# Patient Record
Sex: Male | Born: 1940
Health system: Southern US, Community
[De-identification: ages and names within clinical notes are randomized; demographics above are authoritative.]

## PROBLEM LIST (undated history)

## (undated) DIAGNOSIS — E538 Deficiency of other specified B group vitamins: Secondary | ICD-10-CM

## (undated) DIAGNOSIS — I350 Nonrheumatic aortic (valve) stenosis: Secondary | ICD-10-CM

## (undated) DIAGNOSIS — M5030 Other cervical disc degeneration, unspecified cervical region: Secondary | ICD-10-CM

## (undated) DIAGNOSIS — S82892A Other fracture of left lower leg, initial encounter for closed fracture: Secondary | ICD-10-CM

## (undated) DIAGNOSIS — E7800 Pure hypercholesterolemia, unspecified: Secondary | ICD-10-CM

## (undated) DIAGNOSIS — Z952 Presence of prosthetic heart valve: Secondary | ICD-10-CM

## (undated) DIAGNOSIS — N489 Disorder of penis, unspecified: Secondary | ICD-10-CM

## (undated) DIAGNOSIS — K5790 Diverticulosis of intestine, part unspecified, without perforation or abscess without bleeding: Secondary | ICD-10-CM

## (undated) DIAGNOSIS — C4490 Unspecified malignant neoplasm of skin, unspecified: Secondary | ICD-10-CM

## (undated) DIAGNOSIS — R7989 Other specified abnormal findings of blood chemistry: Secondary | ICD-10-CM

## (undated) DIAGNOSIS — Z973 Presence of spectacles and contact lenses: Secondary | ICD-10-CM

## (undated) DIAGNOSIS — E039 Hypothyroidism, unspecified: Secondary | ICD-10-CM

## (undated) DIAGNOSIS — I10 Essential (primary) hypertension: Secondary | ICD-10-CM

## (undated) DIAGNOSIS — E559 Vitamin D deficiency, unspecified: Secondary | ICD-10-CM

## (undated) HISTORY — DX: Disorder of penis, unspecified: N48.9

## (undated) HISTORY — DX: Deficiency of other specified B group vitamins: E53.8

## (undated) HISTORY — DX: Nonrheumatic aortic (valve) stenosis: I35.0

## (undated) HISTORY — DX: Hypothyroidism, unspecified: E03.9

## (undated) HISTORY — DX: Other fracture of left lower leg, initial encounter for closed fracture: S82.892A

## (undated) HISTORY — DX: Diverticulosis of intestine, part unspecified, without perforation or abscess without bleeding: K57.90

## (undated) HISTORY — PX: MENISCUS REPAIR: SHX5179

## (undated) HISTORY — PX: UMBILICAL HERNIA REPAIR: SHX196

## (undated) HISTORY — DX: Other specified abnormal findings of blood chemistry: R79.89

## (undated) HISTORY — PX: COLONOSCOPY: SHX174

## (undated) HISTORY — DX: Essential (primary) hypertension: I10

## (undated) HISTORY — DX: Unspecified malignant neoplasm of skin, unspecified: C44.90

## (undated) HISTORY — DX: Other cervical disc degeneration, unspecified cervical region: M50.30

## (undated) HISTORY — DX: Vitamin D deficiency, unspecified: E55.9

## (undated) HISTORY — DX: Pure hypercholesterolemia, unspecified: E78.00

---

## 2006-04-27 ENCOUNTER — Ambulatory Visit (HOSPITAL_BASED_OUTPATIENT_CLINIC_OR_DEPARTMENT_OTHER): Admission: RE | Admit: 2006-04-27 | Discharge: 2006-04-27 | Payer: Self-pay | Admitting: Orthopedic Surgery

## 2012-08-02 DIAGNOSIS — S82892A Other fracture of left lower leg, initial encounter for closed fracture: Secondary | ICD-10-CM

## 2012-08-02 HISTORY — DX: Other fracture of left lower leg, initial encounter for closed fracture: S82.892A

## 2015-06-06 DIAGNOSIS — E039 Hypothyroidism, unspecified: Secondary | ICD-10-CM | POA: Diagnosis not present

## 2015-06-06 DIAGNOSIS — E569 Vitamin deficiency, unspecified: Secondary | ICD-10-CM | POA: Diagnosis not present

## 2015-06-06 DIAGNOSIS — R799 Abnormal finding of blood chemistry, unspecified: Secondary | ICD-10-CM | POA: Diagnosis not present

## 2015-06-06 DIAGNOSIS — E291 Testicular hypofunction: Secondary | ICD-10-CM | POA: Diagnosis not present

## 2015-06-06 DIAGNOSIS — D510 Vitamin B12 deficiency anemia due to intrinsic factor deficiency: Secondary | ICD-10-CM | POA: Diagnosis not present

## 2015-06-06 DIAGNOSIS — R5382 Chronic fatigue, unspecified: Secondary | ICD-10-CM | POA: Diagnosis not present

## 2015-06-06 DIAGNOSIS — E559 Vitamin D deficiency, unspecified: Secondary | ICD-10-CM | POA: Diagnosis not present

## 2015-10-01 DIAGNOSIS — M549 Dorsalgia, unspecified: Secondary | ICD-10-CM | POA: Diagnosis not present

## 2015-10-01 DIAGNOSIS — I358 Other nonrheumatic aortic valve disorders: Secondary | ICD-10-CM | POA: Diagnosis not present

## 2015-10-01 DIAGNOSIS — R0781 Pleurodynia: Secondary | ICD-10-CM | POA: Diagnosis not present

## 2015-10-07 DIAGNOSIS — E291 Testicular hypofunction: Secondary | ICD-10-CM | POA: Diagnosis not present

## 2015-10-07 DIAGNOSIS — R5382 Chronic fatigue, unspecified: Secondary | ICD-10-CM | POA: Diagnosis not present

## 2015-10-07 DIAGNOSIS — D510 Vitamin B12 deficiency anemia due to intrinsic factor deficiency: Secondary | ICD-10-CM | POA: Diagnosis not present

## 2015-10-07 DIAGNOSIS — E559 Vitamin D deficiency, unspecified: Secondary | ICD-10-CM | POA: Diagnosis not present

## 2015-10-07 DIAGNOSIS — R799 Abnormal finding of blood chemistry, unspecified: Secondary | ICD-10-CM | POA: Diagnosis not present

## 2015-10-07 DIAGNOSIS — E039 Hypothyroidism, unspecified: Secondary | ICD-10-CM | POA: Diagnosis not present

## 2015-10-07 DIAGNOSIS — E569 Vitamin deficiency, unspecified: Secondary | ICD-10-CM | POA: Diagnosis not present

## 2015-10-16 DIAGNOSIS — M5137 Other intervertebral disc degeneration, lumbosacral region: Secondary | ICD-10-CM | POA: Diagnosis not present

## 2015-10-16 DIAGNOSIS — M5136 Other intervertebral disc degeneration, lumbar region: Secondary | ICD-10-CM | POA: Diagnosis not present

## 2015-10-16 DIAGNOSIS — R109 Unspecified abdominal pain: Secondary | ICD-10-CM | POA: Diagnosis not present

## 2015-10-16 DIAGNOSIS — R0781 Pleurodynia: Secondary | ICD-10-CM | POA: Diagnosis not present

## 2015-10-16 DIAGNOSIS — I071 Rheumatic tricuspid insufficiency: Secondary | ICD-10-CM | POA: Diagnosis not present

## 2015-10-16 DIAGNOSIS — M549 Dorsalgia, unspecified: Secondary | ICD-10-CM | POA: Diagnosis not present

## 2015-10-16 DIAGNOSIS — I340 Nonrheumatic mitral (valve) insufficiency: Secondary | ICD-10-CM | POA: Diagnosis not present

## 2015-10-16 DIAGNOSIS — M47814 Spondylosis without myelopathy or radiculopathy, thoracic region: Secondary | ICD-10-CM | POA: Diagnosis not present

## 2015-10-16 DIAGNOSIS — I358 Other nonrheumatic aortic valve disorders: Secondary | ICD-10-CM | POA: Diagnosis not present

## 2015-10-20 DIAGNOSIS — E569 Vitamin deficiency, unspecified: Secondary | ICD-10-CM | POA: Diagnosis not present

## 2015-10-20 DIAGNOSIS — R5383 Other fatigue: Secondary | ICD-10-CM | POA: Diagnosis not present

## 2015-11-06 DIAGNOSIS — R69 Illness, unspecified: Secondary | ICD-10-CM | POA: Diagnosis not present

## 2015-11-17 DIAGNOSIS — J069 Acute upper respiratory infection, unspecified: Secondary | ICD-10-CM | POA: Diagnosis not present

## 2015-11-17 DIAGNOSIS — J0100 Acute maxillary sinusitis, unspecified: Secondary | ICD-10-CM | POA: Diagnosis not present

## 2015-12-03 DIAGNOSIS — Z0000 Encounter for general adult medical examination without abnormal findings: Secondary | ICD-10-CM | POA: Diagnosis not present

## 2015-12-03 DIAGNOSIS — Z23 Encounter for immunization: Secondary | ICD-10-CM | POA: Diagnosis not present

## 2015-12-16 DIAGNOSIS — Z23 Encounter for immunization: Secondary | ICD-10-CM | POA: Diagnosis not present

## 2015-12-24 DIAGNOSIS — I10 Essential (primary) hypertension: Secondary | ICD-10-CM | POA: Diagnosis not present

## 2016-01-23 DIAGNOSIS — Z23 Encounter for immunization: Secondary | ICD-10-CM | POA: Diagnosis not present

## 2016-01-23 DIAGNOSIS — T798XXA Other early complications of trauma, initial encounter: Secondary | ICD-10-CM | POA: Diagnosis not present

## 2016-01-23 DIAGNOSIS — B999 Unspecified infectious disease: Secondary | ICD-10-CM | POA: Diagnosis not present

## 2016-05-11 DIAGNOSIS — K5790 Diverticulosis of intestine, part unspecified, without perforation or abscess without bleeding: Secondary | ICD-10-CM | POA: Diagnosis not present

## 2016-06-14 DIAGNOSIS — D485 Neoplasm of uncertain behavior of skin: Secondary | ICD-10-CM | POA: Diagnosis not present

## 2016-06-14 DIAGNOSIS — L570 Actinic keratosis: Secondary | ICD-10-CM | POA: Diagnosis not present

## 2016-06-16 DIAGNOSIS — E559 Vitamin D deficiency, unspecified: Secondary | ICD-10-CM | POA: Diagnosis not present

## 2016-06-16 DIAGNOSIS — R790 Abnormal level of blood mineral: Secondary | ICD-10-CM | POA: Diagnosis not present

## 2016-06-16 DIAGNOSIS — R7301 Impaired fasting glucose: Secondary | ICD-10-CM | POA: Diagnosis not present

## 2016-06-16 DIAGNOSIS — N529 Male erectile dysfunction, unspecified: Secondary | ICD-10-CM | POA: Diagnosis not present

## 2016-06-16 DIAGNOSIS — E039 Hypothyroidism, unspecified: Secondary | ICD-10-CM | POA: Diagnosis not present

## 2016-06-16 DIAGNOSIS — I358 Other nonrheumatic aortic valve disorders: Secondary | ICD-10-CM | POA: Diagnosis not present

## 2016-06-16 DIAGNOSIS — Z79899 Other long term (current) drug therapy: Secondary | ICD-10-CM | POA: Diagnosis not present

## 2016-06-16 DIAGNOSIS — E785 Hyperlipidemia, unspecified: Secondary | ICD-10-CM | POA: Diagnosis not present

## 2016-06-16 DIAGNOSIS — E291 Testicular hypofunction: Secondary | ICD-10-CM | POA: Diagnosis not present

## 2016-06-16 DIAGNOSIS — I10 Essential (primary) hypertension: Secondary | ICD-10-CM | POA: Diagnosis not present

## 2016-08-04 DIAGNOSIS — J069 Acute upper respiratory infection, unspecified: Secondary | ICD-10-CM | POA: Diagnosis not present

## 2016-08-10 DIAGNOSIS — R790 Abnormal level of blood mineral: Secondary | ICD-10-CM | POA: Diagnosis not present

## 2016-08-10 DIAGNOSIS — D582 Other hemoglobinopathies: Secondary | ICD-10-CM | POA: Diagnosis not present

## 2016-09-08 DIAGNOSIS — R05 Cough: Secondary | ICD-10-CM | POA: Diagnosis not present

## 2016-09-08 DIAGNOSIS — J101 Influenza due to other identified influenza virus with other respiratory manifestations: Secondary | ICD-10-CM | POA: Diagnosis not present

## 2016-09-30 DIAGNOSIS — Z1211 Encounter for screening for malignant neoplasm of colon: Secondary | ICD-10-CM | POA: Diagnosis not present

## 2016-09-30 DIAGNOSIS — Z86010 Personal history of colonic polyps: Secondary | ICD-10-CM | POA: Diagnosis not present

## 2016-10-07 DIAGNOSIS — E291 Testicular hypofunction: Secondary | ICD-10-CM | POA: Diagnosis not present

## 2016-10-07 DIAGNOSIS — Z1389 Encounter for screening for other disorder: Secondary | ICD-10-CM | POA: Diagnosis not present

## 2016-10-07 DIAGNOSIS — I10 Essential (primary) hypertension: Secondary | ICD-10-CM | POA: Diagnosis not present

## 2016-10-07 DIAGNOSIS — Z0000 Encounter for general adult medical examination without abnormal findings: Secondary | ICD-10-CM | POA: Diagnosis not present

## 2016-10-07 DIAGNOSIS — E039 Hypothyroidism, unspecified: Secondary | ICD-10-CM | POA: Diagnosis not present

## 2016-10-07 DIAGNOSIS — R790 Abnormal level of blood mineral: Secondary | ICD-10-CM | POA: Diagnosis not present

## 2016-10-07 DIAGNOSIS — E559 Vitamin D deficiency, unspecified: Secondary | ICD-10-CM | POA: Diagnosis not present

## 2016-10-07 DIAGNOSIS — Z79899 Other long term (current) drug therapy: Secondary | ICD-10-CM | POA: Diagnosis not present

## 2016-10-11 DIAGNOSIS — H6123 Impacted cerumen, bilateral: Secondary | ICD-10-CM | POA: Diagnosis not present

## 2016-10-20 DIAGNOSIS — Z86010 Personal history of colonic polyps: Secondary | ICD-10-CM | POA: Diagnosis not present

## 2016-10-20 DIAGNOSIS — Z1211 Encounter for screening for malignant neoplasm of colon: Secondary | ICD-10-CM | POA: Diagnosis not present

## 2016-10-20 DIAGNOSIS — K5730 Diverticulosis of large intestine without perforation or abscess without bleeding: Secondary | ICD-10-CM | POA: Diagnosis not present

## 2016-12-18 DIAGNOSIS — S32039A Unspecified fracture of third lumbar vertebra, initial encounter for closed fracture: Secondary | ICD-10-CM | POA: Diagnosis not present

## 2016-12-18 DIAGNOSIS — S32019A Unspecified fracture of first lumbar vertebra, initial encounter for closed fracture: Secondary | ICD-10-CM | POA: Diagnosis not present

## 2016-12-18 DIAGNOSIS — W109XXA Fall (on) (from) unspecified stairs and steps, initial encounter: Secondary | ICD-10-CM | POA: Diagnosis not present

## 2016-12-18 DIAGNOSIS — S32029A Unspecified fracture of second lumbar vertebra, initial encounter for closed fracture: Secondary | ICD-10-CM | POA: Diagnosis not present

## 2016-12-18 DIAGNOSIS — E039 Hypothyroidism, unspecified: Secondary | ICD-10-CM | POA: Diagnosis not present

## 2016-12-18 DIAGNOSIS — Z79899 Other long term (current) drug therapy: Secondary | ICD-10-CM | POA: Diagnosis not present

## 2016-12-18 DIAGNOSIS — I10 Essential (primary) hypertension: Secondary | ICD-10-CM | POA: Diagnosis not present

## 2017-06-21 DIAGNOSIS — I10 Essential (primary) hypertension: Secondary | ICD-10-CM | POA: Diagnosis not present

## 2017-06-21 DIAGNOSIS — E559 Vitamin D deficiency, unspecified: Secondary | ICD-10-CM | POA: Diagnosis not present

## 2017-06-21 DIAGNOSIS — R7301 Impaired fasting glucose: Secondary | ICD-10-CM | POA: Diagnosis not present

## 2017-06-21 DIAGNOSIS — R790 Abnormal level of blood mineral: Secondary | ICD-10-CM | POA: Diagnosis not present

## 2017-06-21 DIAGNOSIS — E039 Hypothyroidism, unspecified: Secondary | ICD-10-CM | POA: Diagnosis not present

## 2017-06-21 DIAGNOSIS — E785 Hyperlipidemia, unspecified: Secondary | ICD-10-CM | POA: Diagnosis not present

## 2017-06-21 DIAGNOSIS — E291 Testicular hypofunction: Secondary | ICD-10-CM | POA: Diagnosis not present

## 2017-06-21 DIAGNOSIS — E538 Deficiency of other specified B group vitamins: Secondary | ICD-10-CM | POA: Diagnosis not present

## 2017-06-21 DIAGNOSIS — M542 Cervicalgia: Secondary | ICD-10-CM | POA: Diagnosis not present

## 2017-06-21 DIAGNOSIS — I358 Other nonrheumatic aortic valve disorders: Secondary | ICD-10-CM | POA: Diagnosis not present

## 2017-06-21 DIAGNOSIS — Z79899 Other long term (current) drug therapy: Secondary | ICD-10-CM | POA: Diagnosis not present

## 2017-07-19 DIAGNOSIS — L219 Seborrheic dermatitis, unspecified: Secondary | ICD-10-CM | POA: Diagnosis not present

## 2017-07-19 DIAGNOSIS — L853 Xerosis cutis: Secondary | ICD-10-CM | POA: Diagnosis not present

## 2017-07-19 DIAGNOSIS — L570 Actinic keratosis: Secondary | ICD-10-CM | POA: Diagnosis not present

## 2017-07-21 DIAGNOSIS — I358 Other nonrheumatic aortic valve disorders: Secondary | ICD-10-CM | POA: Diagnosis not present

## 2017-08-10 DIAGNOSIS — M542 Cervicalgia: Secondary | ICD-10-CM | POA: Insufficient documentation

## 2017-08-10 DIAGNOSIS — I350 Nonrheumatic aortic (valve) stenosis: Secondary | ICD-10-CM

## 2017-08-10 DIAGNOSIS — E039 Hypothyroidism, unspecified: Secondary | ICD-10-CM

## 2017-08-10 DIAGNOSIS — N489 Disorder of penis, unspecified: Secondary | ICD-10-CM | POA: Insufficient documentation

## 2017-08-10 DIAGNOSIS — R7989 Other specified abnormal findings of blood chemistry: Secondary | ICD-10-CM | POA: Insufficient documentation

## 2017-08-10 DIAGNOSIS — E7800 Pure hypercholesterolemia, unspecified: Secondary | ICD-10-CM | POA: Insufficient documentation

## 2017-08-10 DIAGNOSIS — E538 Deficiency of other specified B group vitamins: Secondary | ICD-10-CM

## 2017-08-10 DIAGNOSIS — E785 Hyperlipidemia, unspecified: Secondary | ICD-10-CM | POA: Diagnosis not present

## 2017-08-10 DIAGNOSIS — K5732 Diverticulitis of large intestine without perforation or abscess without bleeding: Secondary | ICD-10-CM | POA: Diagnosis not present

## 2017-08-10 DIAGNOSIS — E559 Vitamin D deficiency, unspecified: Secondary | ICD-10-CM | POA: Insufficient documentation

## 2017-08-10 DIAGNOSIS — I10 Essential (primary) hypertension: Secondary | ICD-10-CM

## 2017-08-10 DIAGNOSIS — N529 Male erectile dysfunction, unspecified: Secondary | ICD-10-CM | POA: Diagnosis not present

## 2017-08-10 HISTORY — DX: Nonrheumatic aortic (valve) stenosis: I35.0

## 2017-08-10 HISTORY — DX: Vitamin D deficiency, unspecified: E55.9

## 2017-08-10 HISTORY — DX: Other specified abnormal findings of blood chemistry: R79.89

## 2017-08-10 HISTORY — DX: Hypothyroidism, unspecified: E03.9

## 2017-08-10 HISTORY — DX: Essential (primary) hypertension: I10

## 2017-08-10 HISTORY — DX: Deficiency of other specified B group vitamins: E53.8

## 2017-08-10 HISTORY — DX: Pure hypercholesterolemia, unspecified: E78.00

## 2017-08-10 HISTORY — DX: Disorder of penis, unspecified: N48.9

## 2017-08-15 ENCOUNTER — Ambulatory Visit: Payer: Medicare HMO | Admitting: Cardiology

## 2017-08-15 ENCOUNTER — Encounter: Payer: Self-pay | Admitting: Cardiology

## 2017-08-15 VITALS — BP 124/78 | HR 60 | Ht 70.0 in | Wt 216.0 lb

## 2017-08-15 DIAGNOSIS — I10 Essential (primary) hypertension: Secondary | ICD-10-CM

## 2017-08-15 DIAGNOSIS — I350 Nonrheumatic aortic (valve) stenosis: Secondary | ICD-10-CM | POA: Diagnosis not present

## 2017-08-15 MED ORDER — CLINDAMYCIN HCL 300 MG PO CAPS: 600.0000 mg | ORAL_CAPSULE | Freq: Every day | ORAL | 2 refills | Status: DC

## 2017-08-15 NOTE — Patient Instructions (Addendum)
Medication Instructions:  Your physician has recommended you make the following change in your medication:  START clindamycin 600 mg (2 tablets) 30 minutes before dental procedures.  Labwork: None  Testing/Procedures: You had an EKG today.  Follow-Up: Your physician wants you to follow-up in: 11 months. You will receive a reminder letter in the mail two months in advance. If you don't receive a letter, please call our office to schedule the follow-up appointment.   Any Other Special Instructions Will Be Listed Below (If Applicable).     If you need a refill on your cardiac medications before your next appointment, please call your pharmacy.    Aortic Valve Stenosis Aortic valve stenosis is a narrowing of the aortic valve. The aortic valve opens and closes to regulate blood flow between the lower left chamber of the heart (left ventricle) and the blood vessel that leads away from the heart (aorta). When the aortic valve becomes narrow, it makes it difficult for the heart to pump blood into the aorta, which causes the heart to work harder. The extra work can weaken the heart over time. Aortic valve stenosis can range from mild to severe. If untreated, it can become more severe over time and can lead to heart failure. What are the causes? This condition may be caused by:  Buildup of calcium around and on the valve. This can occur with aging and is your type of problem.  Birth defect.  Rheumatic fever.  Radiation to the chest.  What increases the risk? You may be more likely to develop this condition if:  You are over the age of 26.  You were born with an abnormal bicuspid valve.  What are the signs or symptoms? You may have no symptoms until your condition becomes severe. It may take 10-20 years for mild or moderate aortic valve stenosis to become severe. Symptoms may include:  Shortness of breath. This may get worse during physical activity.  Feeling unusually weak and  tired (fatigue).  Extreme discomfort in the chest, neck, or arm (angina).  A heartbeat that is irregular or faster than normal (palpitations).  Dizziness or fainting. This may happen when you get physically tired or after you take certain heart medicines, such as nitroglycerin.  How is this diagnosed? This condition may be diagnosed with:  A physical exam.  Echocardiogram. This is a type of imaging test that uses sound waves (ultrasound) to make an image of your heart. There are two types that may be used: ? Transthoracic echocardiogram (TTE). This type of echocardiogram is noninvasive, and it is usually done first. ? Transesophageal echocardiogram (TEE). This type of echocardiogram is done by passing a flexible tube down your esophagus. The heart and the esophagus are close to each other, so your health care provider can take very clear, detailed pictures of the heart using this type of test.  Cardiac catheterization. In this procedure, a thin, flexible tube (catheter) is passed through a large vein in your neck, groin, or arm. This procedure provides information about arteries, structures, blood pressure, and oxygen levels in your heart.  Electrocardiogram (ECG). This records the electrical impulses of your heart and assesses heart function.  Stress tests. These are tests that evaluate the blood supply to your heart and your heart's response to exercise.  Blood tests.  You may work with a health care provider who specializes in the heart (cardiologist). How is this treated? Treatment depends on how severe your condition is and what your symptoms are.  You will need to have your heart checked regularly to make sure that your condition is not getting worse or causing serious problems. If your condition is mild, no treatment may be needed. Treatment may include:  Medicines that help keep your heart rate regular.  Medicines that thin your blood (anticoagulants) to prevent the formation  of blood clots.  Antibiotic medicines to help prevent infection. Clindamycin 30 minutes before any dental visit.  Control your blood pressure with medication.  Surgery to replace your aortic valve when it becomes severe.  This is the most common treatment for aortic valve stenosis. Several types of surgeries are available. The surgery may be done through a large incision over your heart (open heart surgery), or it may be done using a minimally invasive technique (transcatheter aortic valve replacement, or TAVR).  Follow these instructions at home: Lifestyle   Limit alcohol intake to no more than 1 drink per day for nonpregnant women and 2 drinks per day for men. One drink equals 12 oz of beer, 5 oz of wine, or 1 oz of hard liquor.  Do not use any tobacco products, such as cigarettes, chewing tobacco, or e-cigarettes. If you need help quitting, ask your health care provider.  Work with your health care provider to manage your blood pressure and cholesterol.  Maintain a healthy weight. Eating and drinking  Follow instructions from your health care provider about eating or drinking restrictions. ? Limit how much caffeine you drink. Caffeine can affect your heart's rate and rhythm.  Drink enough fluid to keep your urine clear or pale yellow.  Eat a heart-healthy diet. This should include plenty of fresh fruits and vegetables. If you eat meat, it should be lean cuts. Avoid foods that are: ? High in salt, saturated fat, or sugar. ? Canned or highly processed. ? Fried. Activity  Return to your normal activities as told by your health care provider. Ask your health care provider what activities are safe for you.  Exercise regularly, as told by your health care provider. Ask your health care provider what types of exercise are safe for you.  If your aortic valve stenosis is mild, you may need to avoid only very intense physical activity. The more severe your aortic valve stenosis is,  the more activities you may need to avoid. General instructions  Take over-the-counter and prescription medicines only as told by your health care provider.  If you are a woman and you plan to become pregnant, talk with your health care provider before you become pregnant.  Tell all health care providers who care for you that you have aortic valve stenosis.  Keep all follow-up visits as told by your health care provider. This is important. Contact a health care provider if:  You have a fever. Get help right away if:  You develop chest pain or tightness.  You develop shortness of breath or difficulty breathing.  You feel light-headed.  You feel like you might faint.  Your heartbeat is irregular or faster than normal. These symptoms may represent a serious problem that is an emergency. Do not wait to see if the symptoms will go away. Get medical help right away. Call your local emergency services (911 in the [place] Do not drive yourself to the hospital. This information is not intended to replace advice given to you by your health care provider. Make sure you discuss any questions you have with your health care provider. Document Released: 04/17/2003 Document Revised: 12/25/2015 Document Reviewed: 06/22/2015  Elsevier Interactive Patient Education  2017 [employer]  

## 2017-08-15 NOTE — Progress Notes (Signed)
Cardiology Office Note:    Date:  08/15/2017   ID:  Aaron Foley, DOB 1940/12/19, MRN [id number]  PCP:  Philemon Kingdom, MD  Cardiologist:  Norman Herrlich, MD   Referring MD: Philemon Kingdom, MD  ASSESSMENT:    1. Nonrheumatic aortic valve stenosis   2. Essential hypertension    PLAN:    In order of problems listed above:  1. Stable hemodynamically not severe asymptomatic no restriction physical activity.  We will plan repeat echocardiogram next December and initiate endocarditis prophylaxis with clindamycin. 2. Stable blood pressure at target continue current medications.  Next appointment   Medication Adjustments/Labs and Tests Ordered: Current medicines are reviewed at length with the patient today.  Concerns regarding medicines are outlined above.  No orders of the defined types were placed in this encounter.  No orders of the defined types were placed in this encounter.    Chief Complaint  Patient presents with  . Heart Murmur    recent echo with moderate aortic stenosis    History of Present Illness:    Aaron Foley is a 77 y.o. male who is being seen today for the evaluation of aortic stenosis at the request of Prochnau, Rayfield Citizen, MD. We both recognize each other and he tells me he is greater than 3 years since I seen him for mild aortic stenosis.  I am unable to access those records.  Recent echocardiogram showed progression with moderate stenosis.  He has normal left ventricular function he is asymptomatic at this time requires no intervention.  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   Past Medical History:  Diagnosis Date  . Closed left ankle fracture 2014  . Disc disease, degenerative, cervical   . Diverticulosis    mild left colonic  . Hypercholesterolemia 08/10/2017  . Hypertension 08/10/2017  . Hypothyroidism 08/10/2017  . Low testosterone in male  08/10/2017  . Low vitamin B12 level 08/10/2017  . Mild aortic stenosis 08/10/2017  . Neck pain 08/10/2017  . 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 Skin cancer   . Vitamin D deficiency 08/10/2017    History reviewed. No pertinent surgical history.  Current Medications: Current Meds  Medication Sig  . anastrozole (ARIMIDEX) 1 MG tablet TAKE 1/2 TABLET BY MOUTH TWO TIMES WEEKLY  . ARMOUR THYROID 30 MG tablet TAKE ONE TABLET BY MOUTH ON EMPTY STOMACH EVERY DAY  . clobetasol (TEMOVATE) 0.05 % external solution APPLY TO AFFECTED AREAS OF SCALP BID PRN  . metroNIDAZOLE (FLAGYL) 500 MG tablet TAKE ONE TABLET BY MOUTH 4 TIMES DAILY FOR 10 DAYS  . sulfamethoxazole-trimethoprim (BACTRIM DS,SEPTRA DS) 800-160 MG tablet TAKE ONE TABLET BY MOUTH TWICE DAILY FOR 10 DAYS  . telmisartan (MICARDIS) 40 MG tablet TAKE 1 TABLET ONCE DAILY.  Marland Kitchen Testosterone 20 % CREA by Does not apply route daily.     Allergies:   Penicillins   Social History   Socioeconomic History  . Marital status: Married    Spouse name: None  . Number of children: None  . Years of education: None  . Highest education level: None  Social Needs  . Financial resource strain: None  . Food insecurity - worry: None  . Food insecurity - inability: None  . Transportation needs - medical: None  . Transportation needs - non-medical: None  Occupational History  . None  Tobacco Use  . Smoking  status: Former Smoker    Last attempt to quit: 1969    Years since quitting: 50.0  . Smokeless tobacco: Never Used  Substance and Sexual Activity  . Alcohol use: Yes    Comment: 1-2 drinks once a month or less  . Drug use: No  . Sexual activity: None  Other Topics Concern  . None  Social History Narrative  . None     Family History: The patient's family history includes CAD in his paternal grandfather; Diabetes in his maternal grandmother; Heart disease in his father; Hypertension in his maternal grandmother;  Leukemia in his mother.  ROS:   ROS Please see the history of present illness.    Does have back pain after a fall and from his description vertebral fracture  All other systems reviewed and are negative.  EKGs/Labs/Other Studies Reviewed:    The following studies were reviewed today: Office records from his PCP reviewed prior to visit  EKG:  EKG is  ordered today.  The ekg ordered today demonstrates sinus rhythm normal Echo TTE 07/21/17: Modserate AS P/M 44/29 mm Hg AVA 0.9 cm2 EF 60-665% , mild MR and TR Recent Labs: CBC and CMP normal No results found for requested labs within last 8760 hours.  Recent Lipid Panel Chol 189 HDL 32 LDL 110 No results found for: CHOL, TRIG, HDL, CHOLHDL, VLDL, LDLCALC, LDLDIRECT  Physical Exam:    VS:  Ht 5\' 10"  (1.778 m)   Wt 216 lb (98 kg)   BMI 30.99 kg/m     Wt Readings from Last 3 Encounters:  08/15/17 216 lb (98 kg)     GEN:  Well nourished, well developed in no acute distress HEENT: Normal NECK: No JVD; No carotid bruits LYMPHATICS: No lymphadenopathy CARDIAC: Grade 2/6 mid peaking systolic ejection murmur radiates to the right clavicle not to the carotids S2 is single no aortic regurgitation and carotid upstroke is normal.  RRR,  rubs, gallops RESPIRATORY:  Clear to auscultation without rales, wheezing or rhonchi  ABDOMEN: Soft, non-tender, non-distended MUSCULOSKELETAL:  No edema; No deformity  SKIN: Warm and dry NEUROLOGIC:  Alert and oriented x 3 PSYCHIATRIC:  Normal affect     Signed, Norman Herrlich, MD  08/15/2017 10:25 AM    [facility]

## 2017-08-16 DIAGNOSIS — M542 Cervicalgia: Secondary | ICD-10-CM | POA: Diagnosis not present

## 2017-08-17 DIAGNOSIS — M6281 Muscle weakness (generalized): Secondary | ICD-10-CM | POA: Diagnosis not present

## 2017-08-17 DIAGNOSIS — M542 Cervicalgia: Secondary | ICD-10-CM | POA: Diagnosis not present

## 2017-08-19 DIAGNOSIS — M542 Cervicalgia: Secondary | ICD-10-CM | POA: Diagnosis not present

## 2017-08-19 DIAGNOSIS — M6281 Muscle weakness (generalized): Secondary | ICD-10-CM | POA: Diagnosis not present

## 2017-08-22 DIAGNOSIS — M6281 Muscle weakness (generalized): Secondary | ICD-10-CM | POA: Diagnosis not present

## 2017-08-22 DIAGNOSIS — M542 Cervicalgia: Secondary | ICD-10-CM | POA: Diagnosis not present

## 2017-08-25 DIAGNOSIS — M542 Cervicalgia: Secondary | ICD-10-CM | POA: Diagnosis not present

## 2017-08-25 DIAGNOSIS — M6281 Muscle weakness (generalized): Secondary | ICD-10-CM | POA: Diagnosis not present

## 2017-08-29 DIAGNOSIS — M542 Cervicalgia: Secondary | ICD-10-CM | POA: Diagnosis not present

## 2017-08-29 DIAGNOSIS — M6281 Muscle weakness (generalized): Secondary | ICD-10-CM | POA: Diagnosis not present

## 2017-08-31 DIAGNOSIS — M542 Cervicalgia: Secondary | ICD-10-CM | POA: Diagnosis not present

## 2017-08-31 DIAGNOSIS — M6281 Muscle weakness (generalized): Secondary | ICD-10-CM | POA: Diagnosis not present

## 2017-09-05 DIAGNOSIS — M542 Cervicalgia: Secondary | ICD-10-CM | POA: Diagnosis not present

## 2017-09-05 DIAGNOSIS — M6281 Muscle weakness (generalized): Secondary | ICD-10-CM | POA: Diagnosis not present

## 2017-09-12 DIAGNOSIS — M542 Cervicalgia: Secondary | ICD-10-CM | POA: Diagnosis not present

## 2017-09-12 DIAGNOSIS — M6281 Muscle weakness (generalized): Secondary | ICD-10-CM | POA: Diagnosis not present

## 2017-09-13 DIAGNOSIS — M542 Cervicalgia: Secondary | ICD-10-CM | POA: Diagnosis not present

## 2017-10-10 DIAGNOSIS — R7301 Impaired fasting glucose: Secondary | ICD-10-CM | POA: Diagnosis not present

## 2017-10-10 DIAGNOSIS — L819 Disorder of pigmentation, unspecified: Secondary | ICD-10-CM | POA: Diagnosis not present

## 2017-10-10 DIAGNOSIS — I10 Essential (primary) hypertension: Secondary | ICD-10-CM | POA: Diagnosis not present

## 2017-10-10 DIAGNOSIS — Z1331 Encounter for screening for depression: Secondary | ICD-10-CM | POA: Diagnosis not present

## 2017-10-10 DIAGNOSIS — E039 Hypothyroidism, unspecified: Secondary | ICD-10-CM | POA: Diagnosis not present

## 2017-10-10 DIAGNOSIS — Z0001 Encounter for general adult medical examination with abnormal findings: Secondary | ICD-10-CM | POA: Diagnosis not present

## 2017-10-10 DIAGNOSIS — Z1389 Encounter for screening for other disorder: Secondary | ICD-10-CM | POA: Diagnosis not present

## 2017-10-10 DIAGNOSIS — Z79899 Other long term (current) drug therapy: Secondary | ICD-10-CM | POA: Diagnosis not present

## 2017-10-10 DIAGNOSIS — I350 Nonrheumatic aortic (valve) stenosis: Secondary | ICD-10-CM | POA: Diagnosis not present

## 2018-06-27 DIAGNOSIS — I10 Essential (primary) hypertension: Secondary | ICD-10-CM | POA: Diagnosis not present

## 2018-06-27 DIAGNOSIS — Z79899 Other long term (current) drug therapy: Secondary | ICD-10-CM | POA: Diagnosis not present

## 2018-06-27 DIAGNOSIS — E291 Testicular hypofunction: Secondary | ICD-10-CM | POA: Diagnosis not present

## 2018-06-27 DIAGNOSIS — Z125 Encounter for screening for malignant neoplasm of prostate: Secondary | ICD-10-CM | POA: Diagnosis not present

## 2018-06-27 DIAGNOSIS — E785 Hyperlipidemia, unspecified: Secondary | ICD-10-CM | POA: Diagnosis not present

## 2018-06-27 DIAGNOSIS — E559 Vitamin D deficiency, unspecified: Secondary | ICD-10-CM | POA: Diagnosis not present

## 2018-06-27 DIAGNOSIS — R790 Abnormal level of blood mineral: Secondary | ICD-10-CM | POA: Diagnosis not present

## 2018-06-27 DIAGNOSIS — E039 Hypothyroidism, unspecified: Secondary | ICD-10-CM | POA: Diagnosis not present

## 2018-06-28 DIAGNOSIS — R69 Illness, unspecified: Secondary | ICD-10-CM | POA: Diagnosis not present

## 2018-07-03 DIAGNOSIS — E039 Hypothyroidism, unspecified: Secondary | ICD-10-CM | POA: Diagnosis not present

## 2018-07-03 DIAGNOSIS — N529 Male erectile dysfunction, unspecified: Secondary | ICD-10-CM | POA: Diagnosis not present

## 2018-07-03 DIAGNOSIS — Z6831 Body mass index (BMI) 31.0-31.9, adult: Secondary | ICD-10-CM | POA: Diagnosis not present

## 2018-07-03 DIAGNOSIS — M79661 Pain in right lower leg: Secondary | ICD-10-CM | POA: Diagnosis not present

## 2018-07-03 DIAGNOSIS — I10 Essential (primary) hypertension: Secondary | ICD-10-CM | POA: Diagnosis not present

## 2018-07-03 DIAGNOSIS — E669 Obesity, unspecified: Secondary | ICD-10-CM | POA: Diagnosis not present

## 2018-07-20 DIAGNOSIS — L570 Actinic keratosis: Secondary | ICD-10-CM | POA: Diagnosis not present

## 2018-08-14 NOTE — Progress Notes (Signed)
Cardiology Office Note:    Date:  08/15/2018   ID:  Aaron Foley, DOB [birth year], MRN [id number]  PCP:  Philemon Kingdom, MD  Cardiologist:  Norman Herrlich, MD    Referring MD: Philemon Kingdom, MD    ASSESSMENT:    1. Essential hypertension   2. Nonrheumatic aortic valve stenosis    PLAN:    In order of problems listed above:  1. Stable continue current treatment 2. Clinically has progressed symptomatic recheck echocardiogram and likely needs left and right heart catheterization initiate the problem persists of intervention preferably TAVR   Next appointment: 6 weeks   Medication Adjustments/Labs and Tests Ordered: Current medicines are reviewed at length with the patient today.  Concerns regarding medicines are outlined above.  No orders of the defined types were placed in this encounter.  No orders of the defined types were placed in this encounter.   Chief Complaint  Patient presents with  . Aortic Stenosis  . Hypertension    History of Present Illness:    Aaron Foley is a 78 y.o. male with a hx of moderate aortic stenosis and hypertension last seen 08/15/2017. Compliance with diet, lifestyle and medications: Yes  He is pleased with the quality of his life remains active but does notice that at times when he does his 3 mile walk when he walks he probably gets substernal chest tightness and subsequently relieved when he slows down.  He has had no syncope or shortness of breath.  The symptoms are infrequent not severe sustained by suspect he is developing symptomatic aortic stenosis.  He needs a follow-up echocardiogram and if confirmed referral for coronary angiography.  He follows endocarditis prophylaxis Past Medical History:  Diagnosis Date  . Closed left ankle fracture 2014  . Disc disease, degenerative, cervical   . Diverticulosis    mild left colonic  . Hypercholesterolemia 08/10/2017  . Hypertension 08/10/2017  . Hypothyroidism 08/10/2017  .  Low testosterone in male 08/10/2017  . Low vitamin B12 level 08/10/2017  . Mild aortic stenosis 08/10/2017  . Neck pain 08/10/2017  . 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 Skin cancer   . Vitamin D deficiency 08/10/2017    Past Surgical History:  Procedure Laterality Date  . MENISCUS REPAIR Left   . UMBILICAL HERNIA REPAIR      Current Medications: Current Meds  Medication Sig  . anastrozole (ARIMIDEX) 1 MG tablet TAKE 1/2 TABLET BY MOUTH TWO TIMES WEEKLY  . ARMOUR THYROID 30 MG tablet TAKE ONE TABLET BY MOUTH ON EMPTY STOMACH EVERY DAY  . clindamycin (CLEOCIN) 300 MG capsule Take 2 capsules (600 mg total) by mouth daily. Take 30 minutes before appt  . Melatonin 1 MG TABS Take 1.5 tablets by mouth at bedtime.  . sildenafil (REVATIO) 20 MG tablet Take 20 mg by mouth as directed.  . telmisartan (MICARDIS) 40 MG tablet TAKE 1 TABLET ONCE DAILY.  Marland Kitchen Testosterone 20 % CREA by Does not apply route daily.     Allergies:   Penicillins   Social History   Socioeconomic History  . Marital status: Married    Spouse name: Not on file  . Number of children: Not on file  . Years of education: Not on file  . Highest education level: Not on file  Occupational History  . Not on file  Social Needs  . Financial resource strain: Not on file  . Food insecurity:    Worry: Not on file  Inability: Not on file  . Transportation needs:    Medical: Not on file    Non-medical: Not on file  Tobacco Use  . Smoking status: Former Smoker    Last attempt to quit: 1969    Years since quitting: 51.0  . Smokeless tobacco: Never Used  Substance and Sexual Activity  . Alcohol use: Yes    Comment: 1-2 drinks once a month or less  . Drug use: No  . Sexual activity: Not on file  Lifestyle  . Physical activity:    Days per week: Not on file    Minutes per session: Not on file  . Stress: Not on file  Relationships  . Social connections:    Talks on phone: Not on file    Gets  together: Not on file    Attends religious service: Not on file    Active member of club or organization: Not on file    Attends meetings of clubs or organizations: Not on file    Relationship status: Not on file  Other Topics Concern  . Not on file  Social History Narrative  . Not on file     Family History: The patient's family history includes CAD in his paternal grandfather; Diabetes in his maternal grandmother; Heart disease in his father; Hypertension in his maternal grandmother; Leukemia in his mother. ROS:   Please see the history of present illness.    All other systems reviewed and are negative.  EKGs/Labs/Other Studies Reviewed:    The following studies were reviewed today:  EKG:  EKG ordered today.  The ekg ordered today demonstrates [facility] and remains normal  Recent Labs: No results found for requested labs within last 8760 hours.  Recent Lipid Panel No results found for: CHOL, TRIG, HDL, CHOLHDL, VLDL, LDLCALC, LDLDIRECT  Physical Exam:    VS:  BP 134/84 (BP Location: Left Arm, Patient Position: Sitting, Cuff Size: Normal)   Pulse 64   Ht 5\' 11"  (1.803 m)   Wt 223 lb 6 oz (101.3 kg)   SpO2 98%   BMI 31.15 kg/m     Wt Readings from Last 3 Encounters:  08/15/18 223 lb 6 oz (101.3 kg)  08/15/17 216 lb (98 kg)     GEN:  Well nourished, well developed in no acute distress HEENT: Normal NECK: No JVD; No carotid bruits LYMPHATICS: No lymphadenopathy CARDIAC: Grade 3/6 late peaking harsh ejection murmur aortic area to the carotid S2 is single RRR, no  rubs, gallops RESPIRATORY:  Clear to auscultation without rales, wheezing or rhonchi  ABDOMEN: Soft, non-tender, non-distended MUSCULOSKELETAL:  No edema; No deformity  SKIN: Warm and dry NEUROLOGIC:  Alert and oriented x 3 PSYCHIATRIC:  Normal affect    Signed, Norman Herrlich, MD  08/15/2018 10:02 AM    [facility]

## 2018-08-15 ENCOUNTER — Ambulatory Visit (INDEPENDENT_AMBULATORY_CARE_PROVIDER_SITE_OTHER): Payer: Medicare HMO | Admitting: Cardiology

## 2018-08-15 ENCOUNTER — Encounter: Payer: Self-pay | Admitting: Cardiology

## 2018-08-15 VITALS — BP 134/84 | HR 64 | Ht 71.0 in | Wt 223.4 lb

## 2018-08-15 DIAGNOSIS — I350 Nonrheumatic aortic (valve) stenosis: Secondary | ICD-10-CM | POA: Diagnosis not present

## 2018-08-15 DIAGNOSIS — I10 Essential (primary) hypertension: Secondary | ICD-10-CM

## 2018-08-15 DIAGNOSIS — R079 Chest pain, unspecified: Secondary | ICD-10-CM

## 2018-08-15 NOTE — Patient Instructions (Addendum)
Medication Instructions:  Your physician recommends that you continue on your current medications as directed. Please refer to the Current Medication list given to you today.  If you need a refill on your cardiac medications before your next appointment, please call your pharmacy.   Lab work: NONE If you have labs (blood work) drawn today and your tests are completely normal, you will receive your results only by: Marland Kitchen MyChart Message (if you have MyChart) OR . A paper copy in the mail If you have any lab test that is abnormal or we need to change your treatment, we will call you to review the results.  Testing/Procedures: You had an EKG today  Your physician has requested that you have an echocardiogram. Echocardiography is a painless test that uses sound waves to create images of your heart. It provides your doctor with information about the size and shape of your heart and how well your heart's chambers and valves are working. This procedure takes approximately one hour. There are no restrictions for this procedure.    Follow-Up: At [facility], you and your health needs are our priority.  As part of our continuing mission to provide you with exceptional heart care, we have created designated Provider Care Teams.  These Care Teams include your primary Cardiologist (physician) and Advanced Practice Providers (APPs -  Physician Assistants and Nurse Practitioners) who all work together to provide you with the care you need, when you need it. You will need a follow up appointment in 6 weeks.      Echocardiogram An echocardiogram is a procedure that uses painless sound waves (ultrasound) to produce an image of the heart. Images from an echocardiogram can provide important information about:  Signs of coronary artery disease (CAD).  Aneurysm detection. An aneurysm is a weak or damaged part of an artery wall that bulges out from the normal force of blood pumping through the body.  Heart  size and shape. Changes in the size or shape of the heart can be associated with certain conditions, including heart failure, aneurysm, and CAD.  Heart muscle function.  Heart valve function.  Signs of a past heart attack.  Fluid buildup around the heart.  Thickening of the heart muscle.  A tumor or infectious growth around the heart valves. Tell a health care provider about:  Any allergies you have.  All medicines you are taking, including vitamins, herbs, eye drops, creams, and over-the-counter medicines.  Any blood disorders you have.  Any surgeries you have had.  Any medical conditions you have.  Whether you are pregnant or may be pregnant. What are the risks? Generally, this is a safe procedure. However, problems may occur, including:  Allergic reaction to dye (contrast) that may be used during the procedure. What happens before the procedure? No specific preparation is needed. You may eat and drink normally. What happens during the procedure?   An IV tube may be inserted into one of your veins.  You may receive contrast through this tube. A contrast is an injection that improves the quality of the pictures from your heart.  A gel will be applied to your chest.  A wand-like tool (transducer) will be moved over your chest. The gel will help to transmit the sound waves from the transducer.  The sound waves will harmlessly bounce off of your heart to allow the heart images to be captured in real-time motion. The images will be recorded on a computer. The procedure may vary among health care  providers and hospitals. What happens after the procedure?  You may return to your normal, everyday life, including diet, activities, and medicines, unless your health care provider tells you not to do that. Summary  An echocardiogram is a procedure that uses painless sound waves (ultrasound) to produce an image of the heart.  Images from an echocardiogram can provide  important information about the size and shape of your heart, heart muscle function, heart valve function, and fluid buildup around your heart.  You do not need to do anything to prepare before this procedure. You may eat and drink normally.  After the echocardiogram is completed, you may return to your normal, everyday life, unless your health care provider tells you not to do that. This information is not intended to replace advice given to you by your health care provider. Make sure you discuss any questions you have with your health care provider. Document Released: 07/16/2000 Document Revised: 08/21/2016 Document Reviewed: 08/21/2016 Elsevier Interactive Patient Education  2019 [employer].

## 2018-08-23 ENCOUNTER — Other Ambulatory Visit: Payer: Self-pay | Admitting: Cardiology

## 2018-09-26 NOTE — Progress Notes (Signed)
Cardiology Office Note:    Date:  09/28/2018   ID:  Aaron Foley, DOB [birth year], MRN [id number]  PCP:  Philemon Kingdom, MD  Cardiologist:  Norman Herrlich, MD    Referring MD: Philemon Kingdom, MD    ASSESSMENT:    1. Nonrheumatic aortic valve stenosis   2. Pre-op evaluation   3. Essential hypertension   4. Acquired hypothyroidism    PLAN:    In order of problems listed above:  1. His aortic stenosis has progressed by echo it is clearly severe pressure gradient VTI ratio and valve area and he is now symptomatic having exertional angina and shortness of breath.  Overall the quality of life and health is good for him is a very vigorous active man we discussed the natural course of aortic stenosis and after a discussion of options benefits and risk he agrees to undergo diagnostic left and right heart catheterization in preparation for intervention for aortic stenosis. 2. Stable hypertension continue his ARB 3. Stable continue his thyroid supplement   Next appointment: 3 months   Medication Adjustments/Labs and Tests Ordered: Current medicines are reviewed at length with the patient today.  Concerns regarding medicines are outlined above.  Orders Placed This Encounter  Procedures  . DG Chest 2 View  . CBC  . Basic Metabolic Panel (BMET)  . EKG 12-Lead   No orders of the defined types were placed in this encounter.   No chief complaint on file.   History of Present Illness:    Aaron Foley is a 78 y.o. male with a hx of moderate aortic stenosis and hypertension    last seen 08/15/18.  His cardiac echo 09/27/2018 shows severe aortic stenosis mean gradient exceeds 40 mm valve area less than 1 cm.  Left ventricular function remains normal. Compliance with diet, lifestyle and medications: Yes  Overall he is done well but his wife noticed when they did a longer hike in the range of 10 miles he was exhausted and it took him quite a time to recover afterwards  currently different.  He also has developed when he walks quickly longer distances or uphill angina with tightness in the chest relieved with rest and associated shortness of breath.  His aortic stenosis is progressed from moderate to severe.  We discussed the natural history of the disease options for intervention and he made an informed decision along with his wife to undergo left and right heart catheterization and initiate the process of valve intervention.  He has no dye allergy has normal renal function. Past Medical History:  Diagnosis Date  . Closed left ankle fracture 2014  . Disc disease, degenerative, cervical   . Diverticulosis    mild left colonic  . Hypercholesterolemia 08/10/2017  . Hypertension 08/10/2017  . Hypothyroidism 08/10/2017  . Low testosterone in male 08/10/2017  . Low vitamin B12 level 08/10/2017  . Mild aortic stenosis 08/10/2017  . Neck pain 08/10/2017  . 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 Skin cancer   . Vitamin D deficiency 08/10/2017    Past Surgical History:  Procedure Laterality Date  . MENISCUS REPAIR Left   . UMBILICAL HERNIA REPAIR      Current Medications: Current Meds  Medication Sig  . anastrozole (ARIMIDEX) 1 MG tablet TAKE 1/2 TABLET BY MOUTH TWO TIMES WEEKLY  . ARMOUR THYROID 30 MG tablet TAKE ONE TABLET BY MOUTH ON EMPTY STOMACH EVERY DAY  . clindamycin (CLEOCIN) 300 MG capsule TAKE 2 CAPSULES BY  MOUTH DAILY *take 30 minutes before appointment*  . Melatonin 1 MG TABS Take 1.5 tablets by mouth at bedtime.  . sildenafil (REVATIO) 20 MG tablet Take 20 mg by mouth as directed.  . telmisartan (MICARDIS) 40 MG tablet TAKE 1 TABLET ONCE DAILY.  Marland Kitchen Testosterone 20 % CREA by Does not apply route daily.     Allergies:   Penicillins   Social History   Socioeconomic History  . Marital status: Married    Spouse name: Not on file  . Number of children: Not on file  . Years of education: Not on file  . Highest education level:  Not on file  Occupational History  . Not on file  Social Needs  . Financial resource strain: Not on file  . Food insecurity:    Worry: Not on file    Inability: Not on file  . Transportation needs:    Medical: Not on file    Non-medical: Not on file  Tobacco Use  . Smoking status: Former Smoker    Last attempt to quit: 1969    Years since quitting: 51.1  . Smokeless tobacco: Never Used  Substance and Sexual Activity  . Alcohol use: Yes    Comment: 1-2 drinks once a month or less  . Drug use: No  . Sexual activity: Not on file  Lifestyle  . Physical activity:    Days per week: Not on file    Minutes per session: Not on file  . Stress: Not on file  Relationships  . Social connections:    Talks on phone: Not on file    Gets together: Not on file    Attends religious service: Not on file    Active member of club or organization: Not on file    Attends meetings of clubs or organizations: Not on file    Relationship status: Not on file  Other Topics Concern  . Not on file  Social History Narrative  . Not on file     Family History: The patient's family history includes CAD in his paternal grandfather; Diabetes in his maternal grandmother; Heart disease in his father; Hypertension in his maternal grandmother; Leukemia in his mother. ROS:   Please see the history of present illness.    All other systems reviewed and are negative.  EKGs/Labs/Other Studies Reviewed:    The following studies were reviewed today:  EKG:  EKG ordered today.  The ekg ordered today demonstrated personal review sinus rhythm nonspecific ST changes unchanged Echocardiogram yesterday shows normal left ventricular size ejection fraction 50 to 60%.  He had mild left atrial enlargement mitral annular calcification aortic valve was severely thickened and calcified restricted mild regurgitation and severe aortic stenosis present with normal ascending and aortic root size.  I independently reviewed this  echocardiogram prior to the visit Recent Labs: No results found for requested labs within last 8760 hours.  Recent Lipid Panel No results found for: CHOL, TRIG, HDL, CHOLHDL, VLDL, LDLCALC, LDLDIRECT  Physical Exam:    VS:  BP 124/74 (BP Location: Right Arm, Patient Position: Sitting, Cuff Size: Large)   Pulse 62   Ht 5\' 11"  (1.803 m)   Wt 222 lb 12.8 oz (101.1 kg)   SpO2 98%   BMI 31.07 kg/m     Wt Readings from Last 3 Encounters:  09/28/18 222 lb 12.8 oz (101.1 kg)  08/15/18 223 lb 6 oz (101.3 kg)  08/15/17 216 lb (98 kg)     GEN:  Well  nourished, well developed in no acute distress HEENT: Normal NECK: No JVD; No carotid bruits LYMPHATICS: No lymphadenopathy CARDIAC: Grade 3/6 harsh late peaking ejection murmur radiating up to the carotids and right clavicle.  S2 is single.  RRR, no rubs, gallops RESPIRATORY:  Clear to auscultation without rales, wheezing or rhonchi  ABDOMEN: Soft, non-tender, non-distended MUSCULOSKELETAL:  No edema; No deformity  SKIN: Warm and dry NEUROLOGIC:  Alert and oriented x 3 PSYCHIATRIC:  Normal affect    Signed, Norman Herrlich, MD  09/28/2018 1:37 PM    [facility]

## 2018-09-26 NOTE — H&P (View-Only) (Signed)
Cardiology Office Note:    Date:  09/28/2018   ID:  Aaron Foley, DOB [birth year], MRN [id number]  PCP:  Philemon Kingdom, MD  Cardiologist:  Norman Herrlich, MD    Referring MD: Philemon Kingdom, MD    ASSESSMENT:    1. Nonrheumatic aortic valve stenosis   2. Pre-op evaluation   3. Essential hypertension   4. Acquired hypothyroidism    PLAN:    In order of problems listed above:  1. His aortic stenosis has progressed by echo it is clearly severe pressure gradient VTI ratio and valve area and he is now symptomatic having exertional angina and shortness of breath.  Overall the quality of life and health is good for him is a very vigorous active man we discussed the natural course of aortic stenosis and after a discussion of options benefits and risk he agrees to undergo diagnostic left and right heart catheterization in preparation for intervention for aortic stenosis. 2. Stable hypertension continue his ARB 3. Stable continue his thyroid supplement   Next appointment: 3 months   Medication Adjustments/Labs and Tests Ordered: Current medicines are reviewed at length with the patient today.  Concerns regarding medicines are outlined above.  Orders Placed This Encounter  Procedures  . DG Chest 2 View  . CBC  . Basic Metabolic Panel (BMET)  . EKG 12-Lead   No orders of the defined types were placed in this encounter.   No chief complaint on file.   History of Present Illness:    Aaron Foley is a 78 y.o. male with a hx of moderate aortic stenosis and hypertension    last seen 08/15/18.  His cardiac echo 09/27/2018 shows severe aortic stenosis mean gradient exceeds 40 mm valve area less than 1 cm.  Left ventricular function remains normal. Compliance with diet, lifestyle and medications: Yes  Overall he is done well but his wife noticed when they did a longer hike in the range of 10 miles he was exhausted and it took him quite a time to recover afterwards  currently different.  He also has developed when he walks quickly longer distances or uphill angina with tightness in the chest relieved with rest and associated shortness of breath.  His aortic stenosis is progressed from moderate to severe.  We discussed the natural history of the disease options for intervention and he made an informed decision along with his wife to undergo left and right heart catheterization and initiate the process of valve intervention.  He has no dye allergy has normal renal function. Past Medical History:  Diagnosis Date  . Closed left ankle fracture 2014  . Disc disease, degenerative, cervical   . Diverticulosis    mild left colonic  . Hypercholesterolemia 08/10/2017  . Hypertension 08/10/2017  . Hypothyroidism 08/10/2017  . Low testosterone in male 08/10/2017  . Low vitamin B12 level 08/10/2017  . Mild aortic stenosis 08/10/2017  . Neck pain 08/10/2017  . 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 Skin cancer   . Vitamin D deficiency 08/10/2017    Past Surgical History:  Procedure Laterality Date  . MENISCUS REPAIR Left   . UMBILICAL HERNIA REPAIR      Current Medications: Current Meds  Medication Sig  . anastrozole (ARIMIDEX) 1 MG tablet TAKE 1/2 TABLET BY MOUTH TWO TIMES WEEKLY  . ARMOUR THYROID 30 MG tablet TAKE ONE TABLET BY MOUTH ON EMPTY STOMACH EVERY DAY  . clindamycin (CLEOCIN) 300 MG capsule TAKE 2 CAPSULES BY  MOUTH DAILY *take 30 minutes before appointment*  . Melatonin 1 MG TABS Take 1.5 tablets by mouth at bedtime.  . sildenafil (REVATIO) 20 MG tablet Take 20 mg by mouth as directed.  . telmisartan (MICARDIS) 40 MG tablet TAKE 1 TABLET ONCE DAILY.  Marland Kitchen Testosterone 20 % CREA by Does not apply route daily.     Allergies:   Penicillins   Social History   Socioeconomic History  . Marital status: Married    Spouse name: Not on file  . Number of children: Not on file  . Years of education: Not on file  . Highest education level:  Not on file  Occupational History  . Not on file  Social Needs  . Financial resource strain: Not on file  . Food insecurity:    Worry: Not on file    Inability: Not on file  . Transportation needs:    Medical: Not on file    Non-medical: Not on file  Tobacco Use  . Smoking status: Former Smoker    Last attempt to quit: 1969    Years since quitting: 51.1  . Smokeless tobacco: Never Used  Substance and Sexual Activity  . Alcohol use: Yes    Comment: 1-2 drinks once a month or less  . Drug use: No  . Sexual activity: Not on file  Lifestyle  . Physical activity:    Days per week: Not on file    Minutes per session: Not on file  . Stress: Not on file  Relationships  . Social connections:    Talks on phone: Not on file    Gets together: Not on file    Attends religious service: Not on file    Active member of club or organization: Not on file    Attends meetings of clubs or organizations: Not on file    Relationship status: Not on file  Other Topics Concern  . Not on file  Social History Narrative  . Not on file     Family History: The patient's family history includes CAD in his paternal grandfather; Diabetes in his maternal grandmother; Heart disease in his father; Hypertension in his maternal grandmother; Leukemia in his mother. ROS:   Please see the history of present illness.    All other systems reviewed and are negative.  EKGs/Labs/Other Studies Reviewed:    The following studies were reviewed today:  EKG:  EKG ordered today.  The ekg ordered today demonstrated personal review sinus rhythm nonspecific ST changes unchanged Echocardiogram yesterday shows normal left ventricular size ejection fraction 50 to 60%.  He had mild left atrial enlargement mitral annular calcification aortic valve was severely thickened and calcified restricted mild regurgitation and severe aortic stenosis present with normal ascending and aortic root size.  I independently reviewed this  echocardiogram prior to the visit Recent Labs: No results found for requested labs within last 8760 hours.  Recent Lipid Panel No results found for: CHOL, TRIG, HDL, CHOLHDL, VLDL, LDLCALC, LDLDIRECT  Physical Exam:    VS:  BP 124/74 (BP Location: Right Arm, Patient Position: Sitting, Cuff Size: Large)   Pulse 62   Ht 5\' 11"  (1.803 m)   Wt 222 lb 12.8 oz (101.1 kg)   SpO2 98%   BMI 31.07 kg/m     Wt Readings from Last 3 Encounters:  09/28/18 222 lb 12.8 oz (101.1 kg)  08/15/18 223 lb 6 oz (101.3 kg)  08/15/17 216 lb (98 kg)     GEN:  Well  nourished, well developed in no acute distress HEENT: Normal NECK: No JVD; No carotid bruits LYMPHATICS: No lymphadenopathy CARDIAC: Grade 3/6 harsh late peaking ejection murmur radiating up to the carotids and right clavicle.  S2 is single.  RRR, no rubs, gallops RESPIRATORY:  Clear to auscultation without rales, wheezing or rhonchi  ABDOMEN: Soft, non-tender, non-distended MUSCULOSKELETAL:  No edema; No deformity  SKIN: Warm and dry NEUROLOGIC:  Alert and oriented x 3 PSYCHIATRIC:  Normal affect    Signed, Norman Herrlich, MD  09/28/2018 1:37 PM    [facility]

## 2018-09-27 ENCOUNTER — Ambulatory Visit (INDEPENDENT_AMBULATORY_CARE_PROVIDER_SITE_OTHER): Payer: Medicare HMO

## 2018-09-27 DIAGNOSIS — I350 Nonrheumatic aortic (valve) stenosis: Secondary | ICD-10-CM | POA: Diagnosis not present

## 2018-09-27 DIAGNOSIS — R079 Chest pain, unspecified: Secondary | ICD-10-CM | POA: Diagnosis not present

## 2018-09-27 NOTE — Progress Notes (Signed)
Complete echocardiogram has been performed.  Jimmy Gesselle Fitzsimons RDCS, RVT 

## 2018-09-28 ENCOUNTER — Ambulatory Visit (INDEPENDENT_AMBULATORY_CARE_PROVIDER_SITE_OTHER): Payer: Medicare HMO | Admitting: Cardiology

## 2018-09-28 ENCOUNTER — Ambulatory Visit (HOSPITAL_BASED_OUTPATIENT_CLINIC_OR_DEPARTMENT_OTHER)
Admission: RE | Admit: 2018-09-28 | Discharge: 2018-09-28 | Disposition: A | Discharge status: Home / Self Care | Payer: Medicare HMO | Source: Ambulatory Visit | Attending: Cardiology | Admitting: Cardiology

## 2018-09-28 ENCOUNTER — Encounter: Payer: Self-pay | Admitting: Cardiology

## 2018-09-28 VITALS — BP 124/74 | HR 62 | Ht 71.0 in | Wt 222.8 lb

## 2018-09-28 DIAGNOSIS — Z01810 Encounter for preprocedural cardiovascular examination: Secondary | ICD-10-CM | POA: Diagnosis not present

## 2018-09-28 DIAGNOSIS — Z01818 Encounter for other preprocedural examination: Secondary | ICD-10-CM | POA: Diagnosis not present

## 2018-09-28 DIAGNOSIS — I10 Essential (primary) hypertension: Secondary | ICD-10-CM | POA: Diagnosis not present

## 2018-09-28 DIAGNOSIS — I350 Nonrheumatic aortic (valve) stenosis: Secondary | ICD-10-CM | POA: Diagnosis not present

## 2018-09-28 DIAGNOSIS — E039 Hypothyroidism, unspecified: Secondary | ICD-10-CM

## 2018-09-28 NOTE — Patient Instructions (Addendum)
Medication Instructions:  Your physician recommends that you continue on your current medications as directed. Please refer to the Current Medication list given to you today.  If you need a refill on your cardiac medications before your next appointment, please call your pharmacy.   Lab work: Your physician recommends that you return for lab work today: CBC, BMP.   If you have labs (blood work) drawn today and your tests are completely normal, you will receive your results only by: Marland Kitchen MyChart Message (if you have MyChart) OR . A paper copy in the mail If you have any lab test that is abnormal or we need to change your treatment, we will call you to review the results.  Testing/Procedures: You had an EKG today.    A chest x-ray takes a picture of the organs and structures inside the chest, including the heart, lungs, and blood vessels. This test can show several things, including, whether the heart is enlarges; whether fluid is building up in the lungs; and whether pacemaker / defibrillator leads are still in place.     [place] [facility] [address] [place], [place] [address] Dept: [phone] Loc: [phone]  Aaron Foley  09/28/2018  You are scheduled for a Cardiac Catheterization on Thursday, March 12 with Dr. Lauree Chandler.  1. Please arrive at the [facility] (Main Entrance A) at [facility]: [address] [place], [place] [address] at 5:30 AM (This time is two hours before your procedure to ensure your preparation). Free valet parking service is available.   Special note: Every effort is made to have your procedure done on time. Please understand that emergencies sometimes delay scheduled procedures.  2. Diet: Do not eat solid foods after midnight.  The patient may have clear liquids until 5am upon the day of the procedure.  3. Labs: None needed.   4. Medication  instructions in preparation for your procedure:   Contrast Allergy: No   On the morning of your procedure, take your Aspirin and any morning medicines NOT listed above.  You may use sips of water.  5. Plan for one night stay--bring personal belongings. 6. Bring a current list of your medications and current insurance cards. 7. You MUST have a responsible person to drive you home. 8. Someone MUST be with you the first 24 hours after you arrive home or your discharge will be delayed. 9. Please wear clothes that are easy to get on and off and wear slip-on shoes.  Thank you for allowing [place] to care for you!   -- [facility]   Follow-Up: At [facility], you and your health needs are our priority.  As part of our continuing mission to provide you with exceptional heart care, we have created designated Provider Care Teams.  These Care Teams include your primary Cardiologist (physician) and Advanced Practice Providers (APPs -  Physician Assistants and Nurse Practitioners) who all work together to provide you with the care you need, when you need it. You will need a follow up appointment as needed if symptoms worsen or fail to improve.     Aortic Valve Stenosis  Aortic valve stenosis is a narrowing of the aortic valve in the heart. The aortic valve opens and closes to regulate blood flow between the left side of the heart (left ventricle) and the artery that leads away from the heart (aorta). When the aortic valve becomes narrow,  it is difficult for the heart to pump blood out to the body, which causes the heart to work harder. The extra work can weaken the heart muscle over time. Aortic valve stenosis can range from mild to severe. If it is not treated, it can become more severe over time and lead to heart failure. What are the causes? This condition may be caused by:  Buildup of calcium around and on the aortic valve. This can occur with aging. This is the  most common cause of aortic valve stenosis.  A heart problem that developed in the womb (birth defect).  Rheumatic fever.  Radiation to the chest. What increases the risk? You may be more likely to develop this condition if:  You are older than age 34.  You were born with an abnormal bicuspid valve. What are the signs or symptoms? You may not have any symptoms until your condition becomes severe. It may take 10-20 years for mild or moderate aortic valve stenosis to become severe. Symptoms may include:  Shortness of breath. This may get worse during physical activity.  Feeling unusually weak and tired (fatigue).  Extreme discomfort in the chest, neck, or arm during physical activity (angina).  A heartbeat that is irregular or faster than normal (palpitations).  Dizziness or fainting. This may happen when you get physically tired or after you take certain heart medicines, such as nitroglycerin. How is this diagnosed? This condition may be diagnosed with:  A physical exam.  Echocardiogram. This is a type of imaging test that uses sound waves (ultrasound) to make images of your heart. There are two kinds of this test that may be used. ? Transthoracic echocardiogram (TTE). For this type, a wand-like tool (transducer) is moved over your chest to create ultrasound images that are recorded by a computer. ? Transesophageal echocardiogram (TEE). For this type, a flexible tube (probe) is inserted down the part of the body that moves food from your mouth to your stomach (esophagus). The heart and the esophagus are close to each other. Your health care provider will use the probe to take clear, detailed pictures of the heart.  Cardiac catheterization. For this procedure, a small, thin tube (catheter) is passed through a large vein in your neck, groin, or arm. The catheter is used to get information about arteries, structures, blood pressure, and oxygen levels in your heart.  Stress tests.  These are tests that evaluate the blood supply to your heart and your heart's response to exercise. You may work with a health care provider who specializes in the heart (cardiologist) for diagnosis and treatment. How is this treated? Treatment depends on how severe your condition is and what your symptoms are. You will need to have your heart checked regularly to make sure that your condition is not getting worse or causing serious problems. Treatment may also include:  Surgery to replace your aortic valve. This is the most common treatment for aortic valve stenosis, and it is the only treatment to cure the condition. Several types of surgeries are available. The surgery may be done: ? Through a large incision over your heart (open-heart surgery). ? Through small incisions, using a flexible tube called a catheter (transcatheter aortic valve replacement, TAVR).  Medicines that help to keep your heart rate regular.  Medicines that thin your blood (anticoagulants) to prevent blood clots.  Antibiotic medicines to help prevent infection. If your condition is mild, you may only need regular follow-up visits for monitoring. Follow these instructions at  home: Lifestyle  Limit alcohol intake to no more than 1 drink a day for nonpregnant women and 2 drinks a day for men. One drink equals 12 oz of beer, 5 oz of wine, or 1 oz of hard liquor.  Do not use any products that contain nicotine or tobacco, such as cigarettes and e-cigarettes. If you need help quitting, ask your health care provider.  Work with your health care provider to manage your blood pressure and cholesterol.  Maintain a healthy weight. Eating and drinking   Eat a heart-healthy diet that includes plenty of fresh fruits and vegetables, whole grains, lean protein, and low-fat or nonfat dairy.  Limit how much caffeine you drink. Caffeine can affect your heart's rate and rhythm.  Avoid foods that are: ? High in salt (sodium),  saturated fat, or sugar. ? Canned or highly processed. ? Fried.  Follow instructions from your health care provider about any other eating or drinking restrictions. Activity  Exercise regularly and return to your normal activities as told by your health care provider. Ask your health care provider what amount and type of physical activity is safe for you. ? If your aortic valve stenosis is mild, you may only need to avoid very intense physical activity, such as heavy weight lifting. ? The more severe your aortic valve stenosis is, the more activities you may need to avoid. If you are taking blood thinners:  Before you take any medicines that contain aspirin or NSAIDs, talk with your health care provider. These medicines increase your risk for dangerous bleeding.  Take your medicine exactly as told, at the same time every day.  Avoid activities that could cause injury or bruising, and follow instructions about how to prevent falls.  Wear a medical alert bracelet or carry a card that lists what medicines you take. General instructions  Take over-the-counter and prescription medicines only as told by your health care provider.  If you were prescribed an antibiotic, take it as told by your health care provider. Do not stop taking the antibiotic even if you start to feel better.  If you are a woman and you plan to become pregnant, talk with your health care provider before you become pregnant.  Before you have any type of medical or dental procedure or surgery, tell all health care providers that you have aortic valve stenosis. This may affect the treatment that you receive.  Keep all follow-up visits as told by your health care provider. This is important. Contact a health care provider if:  You have a fever. Get help right away if:  You develop any of the following symptoms: ? Chest pain. ? Chest tightness. ? Shortness of breath. ? Trouble breathing.  You feel  light-headed.  You feel like you might faint.  Your heartbeat is irregular or faster than normal. These symptoms may represent a serious problem that is an emergency. Do not wait to see if the symptoms will go away. Get medical help right away. Call your local emergency services (911 in the [place] Do not drive yourself to the hospital. Summary  Aortic valve stenosis is a narrowing of the aortic valve in the heart. The aortic valve opens and closes to regulate blood flow between the left side of the heart (left ventricle) and the artery that leads away from the heart (aorta).  Aortic valve stenosis can range from mild to severe. If it is not treated, it can become more severe over time and lead to heart failure.  Treatment depends on how severe your condition is and what your symptoms are. You will need to have your heart checked regularly to make sure that your condition is not getting worse or causing serious problems.  Exercise regularly and return to your normal activities as told by your health care provider. Ask your health care provider what amount and type of physical activity is safe for you. This information is not intended to replace advice given to you by your health care provider. Make sure you discuss any questions you have with your health care provider. Document Released: 04/17/2003 Document Revised: 04/21/2017 Document Reviewed: 04/21/2017 Elsevier Interactive Patient Education  2019 [employer].

## 2018-09-29 LAB — BASIC METABOLIC PANEL
BUN/Creatinine Ratio: 19 (ref 10–24)
BUN: 19 mg/dL (ref 8–27)
CO2: 24 mmol/L (ref 20–29)
Calcium: 10.2 mg/dL (ref 8.6–10.2)
Chloride: 97 mmol/L (ref 96–106)
Creatinine, Ser: 1.01 mg/dL (ref 0.76–1.27)
GFR calc Af Amer: 83 mL/min/{1.73_m2} (ref >59)
GFR calc non Af Amer: 72 mL/min/{1.73_m2} (ref >59)
Glucose: 87 mg/dL (ref 65–99)
Potassium: 4.5 mmol/L (ref 3.5–5.2)
Sodium: 137 mmol/L (ref 134–144)

## 2018-09-29 LAB — CBC
Hematocrit: 52.6 % — ABNORMAL HIGH (ref 37.5–51.0)
Hemoglobin: 18.5 g/dL — ABNORMAL HIGH (ref 13.0–17.7)
MCH: 33.8 pg — ABNORMAL HIGH (ref 26.6–33.0)
MCHC: 35.2 g/dL (ref 31.5–35.7)
MCV: 96 fL (ref 79–97)
PLATELETS: 259 10*3/uL (ref 150–450)
RBC: 5.47 x10E6/uL (ref 4.14–5.80)
RDW: 12.4 % (ref 11.6–15.4)
WBC: 5.2 10*3/uL (ref 3.4–10.8)

## 2018-10-10 ENCOUNTER — Telehealth: Payer: Self-pay | Admitting: *Deleted

## 2018-10-10 NOTE — Telephone Encounter (Signed)
Pt contacted pre-catheterization scheduled at [facility] for: Thursday October 12, 2018 7:30 AM Verified arrival time and place: [place] Entrance A at: 5:30 AM  No solid food after midnight prior to cath, clear liquids until 5 AM day of procedure. Contrast allergy: no  Hold: Sildenafil-until post procedure.  Except hold medications AM meds can be  taken pre-cath with sip of water including: ASA 81 mg  Confirmed patient has responsible person to drive home post procedure and observe 24 hours after arriving home: yes

## 2018-10-12 ENCOUNTER — Ambulatory Visit (HOSPITAL_COMMUNITY)
Admission: RE | Admit: 2018-10-12 | Discharge: 2018-10-12 | Disposition: A | Discharge status: Home / Self Care | Payer: Medicare HMO | Attending: Cardiovascular Disease | Admitting: Cardiovascular Disease

## 2018-10-12 ENCOUNTER — Encounter (HOSPITAL_COMMUNITY)
Admission: RE | Disposition: A | Discharge status: Home / Self Care | Payer: Self-pay | Source: Home / Self Care | Attending: Cardiovascular Disease

## 2018-10-12 ENCOUNTER — Encounter (HOSPITAL_COMMUNITY): Payer: Self-pay | Admitting: Cardiovascular Disease

## 2018-10-12 ENCOUNTER — Other Ambulatory Visit: Payer: Self-pay

## 2018-10-12 DIAGNOSIS — Z8249 Family history of ischemic heart disease and other diseases of the circulatory system: Secondary | ICD-10-CM | POA: Insufficient documentation

## 2018-10-12 DIAGNOSIS — I060 Rheumatic aortic stenosis: Secondary | ICD-10-CM | POA: Insufficient documentation

## 2018-10-12 DIAGNOSIS — Z79890 Hormone replacement therapy (postmenopausal): Secondary | ICD-10-CM | POA: Insufficient documentation

## 2018-10-12 DIAGNOSIS — I2510 Atherosclerotic heart disease of native coronary artery without angina pectoris: Secondary | ICD-10-CM | POA: Insufficient documentation

## 2018-10-12 DIAGNOSIS — E039 Hypothyroidism, unspecified: Secondary | ICD-10-CM | POA: Diagnosis not present

## 2018-10-12 DIAGNOSIS — E559 Vitamin D deficiency, unspecified: Secondary | ICD-10-CM | POA: Insufficient documentation

## 2018-10-12 DIAGNOSIS — Z79899 Other long term (current) drug therapy: Secondary | ICD-10-CM | POA: Diagnosis not present

## 2018-10-12 DIAGNOSIS — Z87891 Personal history of nicotine dependence: Secondary | ICD-10-CM | POA: Diagnosis not present

## 2018-10-12 DIAGNOSIS — I10 Essential (primary) hypertension: Secondary | ICD-10-CM | POA: Diagnosis not present

## 2018-10-12 DIAGNOSIS — I350 Nonrheumatic aortic (valve) stenosis: Secondary | ICD-10-CM

## 2018-10-12 DIAGNOSIS — E7800 Pure hypercholesterolemia, unspecified: Secondary | ICD-10-CM | POA: Diagnosis not present

## 2018-10-12 DIAGNOSIS — R0602 Shortness of breath: Secondary | ICD-10-CM | POA: Diagnosis not present

## 2018-10-12 DIAGNOSIS — Z880 Allergy status to penicillin: Secondary | ICD-10-CM | POA: Diagnosis not present

## 2018-10-12 HISTORY — PX: RIGHT/LEFT HEART CATH AND CORONARY ANGIOGRAPHY: CATH118266

## 2018-10-12 LAB — POCT I-STAT EG7
Acid-base deficit: 2 mmol/L (ref 0.0–2.0)
Bicarbonate: 23.6 mmol/L (ref 20.0–28.0)
Calcium, Ion: 1.03 mmol/L — ABNORMAL LOW (ref 1.15–1.40)
HCT: 40 % (ref 39.0–52.0)
Hemoglobin: 13.6 g/dL (ref 13.0–17.0)
O2 Saturation: 78 %
PCO2 VEN: 42.9 mmHg — AB (ref 44.0–60.0)
Potassium: 3.6 mmol/L (ref 3.5–5.1)
Sodium: 144 mmol/L (ref 135–145)
TCO2: 25 mmol/L (ref 22–32)
pH, Ven: 7.347 (ref 7.250–7.430)
pO2, Ven: 45 mmHg (ref 32.0–45.0)

## 2018-10-12 LAB — POCT I-STAT 7, (LYTES, BLD GAS, ICA,H+H)
Acid-base deficit: 1 mmol/L (ref 0.0–2.0)
Bicarbonate: 24.2 mmol/L (ref 20.0–28.0)
Calcium, Ion: 1.22 mmol/L (ref 1.15–1.40)
HCT: 43 % (ref 39.0–52.0)
Hemoglobin: 14.6 g/dL (ref 13.0–17.0)
O2 SAT: 98 %
Potassium: 4 mmol/L (ref 3.5–5.1)
Sodium: 141 mmol/L (ref 135–145)
TCO2: 25 mmol/L (ref 22–32)
pCO2 arterial: 40.5 mmHg (ref 32.0–48.0)
pH, Arterial: 7.385 (ref 7.350–7.450)
pO2, Arterial: 114 mmHg — ABNORMAL HIGH (ref 83.0–108.0)

## 2018-10-12 SURGERY — RIGHT/LEFT HEART CATH AND CORONARY ANGIOGRAPHY
Anesthesia: LOCAL

## 2018-10-12 MED ORDER — MIDAZOLAM HCL 2 MG/2ML IJ SOLN
INTRAMUSCULAR | Status: DC | PRN
  Administered 2018-10-12: 1 mg via INTRAVENOUS

## 2018-10-12 MED ORDER — SODIUM CHLORIDE 0.9% FLUSH: 3.0000 mL | INTRAVENOUS | Status: DC | PRN

## 2018-10-12 MED ORDER — SODIUM CHLORIDE 0.9% FLUSH: 3.0000 mL | Freq: Two times a day (BID) | INTRAVENOUS | Status: DC

## 2018-10-12 MED ORDER — SODIUM CHLORIDE 0.9 % WEIGHT BASED INFUSION: 1.0000 mL/kg/h | INTRAVENOUS | Status: DC

## 2018-10-12 MED ORDER — HEPARIN SODIUM (PORCINE) 1000 UNIT/ML IJ SOLN
INTRAMUSCULAR | Status: DC | PRN
  Administered 2018-10-12: 5000 [IU] via INTRAVENOUS

## 2018-10-12 MED ORDER — VERAPAMIL HCL 2.5 MG/ML IV SOLN
INTRAVENOUS | Status: AC
  Filled 2018-10-12: qty 2

## 2018-10-12 MED ORDER — SODIUM CHLORIDE 0.9 % IV SOLN: 250.0000 mL | INTRAVENOUS | Status: DC | PRN

## 2018-10-12 MED ORDER — LIDOCAINE HCL (PF) 1 % IJ SOLN
INTRAMUSCULAR | Status: AC
  Filled 2018-10-12: qty 30

## 2018-10-12 MED ORDER — ONDANSETRON HCL 4 MG/2ML IJ SOLN: 4.0000 mg | Freq: Four times a day (QID) | INTRAMUSCULAR | Status: DC | PRN

## 2018-10-12 MED ORDER — LIDOCAINE HCL (PF) 1 % IJ SOLN
INTRAMUSCULAR | Status: DC | PRN
  Administered 2018-10-12 (×2): 2 mL

## 2018-10-12 MED ORDER — MIDAZOLAM HCL 2 MG/2ML IJ SOLN
INTRAMUSCULAR | Status: AC
  Filled 2018-10-12: qty 2

## 2018-10-12 MED ORDER — HEPARIN (PORCINE) IN NACL 1000-0.9 UT/500ML-% IV SOLN
INTRAVENOUS | Status: DC | PRN
  Administered 2018-10-12: 500 mL

## 2018-10-12 MED ORDER — HEPARIN (PORCINE) IN NACL 1000-0.9 UT/500ML-% IV SOLN
INTRAVENOUS | Status: AC
  Filled 2018-10-12: qty 1000

## 2018-10-12 MED ORDER — FENTANYL CITRATE (PF) 100 MCG/2ML IJ SOLN
INTRAMUSCULAR | Status: DC | PRN
  Administered 2018-10-12: 25 ug via INTRAVENOUS

## 2018-10-12 MED ORDER — ASPIRIN 81 MG PO CHEW: 81.0000 mg | CHEWABLE_TABLET | ORAL | Status: DC

## 2018-10-12 MED ORDER — SODIUM CHLORIDE 0.9 % IV SOLN: INTRAVENOUS | Status: DC

## 2018-10-12 MED ORDER — SODIUM CHLORIDE 0.9 % WEIGHT BASED INFUSION
3.0000 mL/kg/h | INTRAVENOUS | Status: AC
  Administered 2018-10-12: 3 mL/kg/h via INTRAVENOUS

## 2018-10-12 MED ORDER — ACETAMINOPHEN 325 MG PO TABS: 650.0000 mg | ORAL_TABLET | ORAL | Status: DC | PRN

## 2018-10-12 MED ORDER — FENTANYL CITRATE (PF) 100 MCG/2ML IJ SOLN
INTRAMUSCULAR | Status: AC
  Filled 2018-10-12: qty 2

## 2018-10-12 SURGICAL SUPPLY — 14 items
CATH 5FR JL3.5 JR4 ANG PIG MP (CATHETERS) ×2 IMPLANT
CATH BALLN WEDGE 5F 110CM (CATHETERS) ×2 IMPLANT
CATH INFINITI 5FR AL1 (CATHETERS) ×2 IMPLANT
DEVICE RAD COMP TR BAND LRG (VASCULAR PRODUCTS) ×2 IMPLANT
GLIDESHEATH SLEND SS 6F .021 (SHEATH) ×2 IMPLANT
GUIDEWIRE INQWIRE 1.5J.035X260 (WIRE) ×1 IMPLANT
INQWIRE 1.5J .035X260CM (WIRE) ×2
KIT HEART LEFT (KITS) ×2 IMPLANT
PACK CARDIAC CATHETERIZATION (CUSTOM PROCEDURE TRAY) ×2 IMPLANT
SHEATH GLIDE SLENDER 4/5FR (SHEATH) ×2 IMPLANT
SHEATH PROBE COVER 6X72 (BAG) ×2 IMPLANT
TRANSDUCER W/STOPCOCK (MISCELLANEOUS) ×2 IMPLANT
TUBING CIL FLEX 10 FLL-RA (TUBING) ×2 IMPLANT
WIRE EMERALD ST .035X150CM (WIRE) ×4 IMPLANT

## 2018-10-12 NOTE — Interval H&P Note (Signed)
History and Physical Interval Note:  10/12/2018 7:20 AM  Lowell Guitar  has presented today for cardiac cath with the diagnosis of severe aortic stenosis.  The various methods of treatment have been discussed with the patient and family. After consideration of risks, benefits and other options for treatment, the patient has consented to  Procedure(s): RIGHT/LEFT HEART CATH AND CORONARY ANGIOGRAPHY (N/A) as a surgical intervention.  The patient's history has been reviewed, patient examined, no change in status, stable for surgery.  I have reviewed the patient's chart and labs.  Questions were answered to the patient's satisfaction.    Cath Lab Visit (complete for each Cath Lab visit)  Clinical Evaluation Leading to the Procedure:   ACS: No.  Non-ACS:    Anginal Classification: CCS II  Anti-ischemic medical therapy: No Therapy  Non-Invasive Test Results: No non-invasive testing performed  Prior CABG: No previous CABG         Lauree Chandler

## 2018-10-12 NOTE — Discharge Instructions (Signed)
Radial Site Care ° °This sheet gives you information about how to care for yourself after your procedure. Your health care provider may also give you more specific instructions. If you have problems or questions, contact your health care provider. °What can I expect after the procedure? °After the procedure, it is common to have: °· Bruising and tenderness at the catheter insertion area. °Follow these instructions at home: °Medicines °· Take over-the-counter and prescription medicines only as told by your health care provider. °Insertion site care °· Follow instructions from your health care provider about how to take care of your insertion site. Make sure you: °? Wash your hands with soap and water before you change your bandage (dressing). If soap and water are not available, use hand sanitizer. °? Change your dressing as told by your health care provider. °? Leave stitches (sutures), skin glue, or adhesive strips in place. These skin closures may need to stay in place for 2 weeks or longer. If adhesive strip edges start to loosen and curl up, you may trim the loose edges. Do not remove adhesive strips completely unless your health care provider tells you to do that. °· Check your insertion site every day for signs of infection. Check for: °? Redness, swelling, or pain. °? Fluid or blood. °? Pus or a bad smell. °? Warmth. °· Do not take baths, swim, or use a hot tub until your health care provider approves. °· You may shower 24-48 hours after the procedure, or as directed by your health care provider. °? Remove the dressing and gently wash the site with plain soap and water. °? Pat the area dry with a clean towel. °? Do not rub the site. That could cause bleeding. °· Do not apply powder or lotion to the site. °Activity ° °· For 24 hours after the procedure, or as directed by your health care provider: °? Do not flex or bend the affected arm. °? Do not push or pull heavy objects with the affected arm. °? Do not  drive yourself home from the hospital or clinic. You may drive 24 hours after the procedure unless your health care provider tells you not to. °? Do not operate machinery or power tools. °· Do not lift anything that is heavier than 10 lb (4.5 kg), or the limit that you are told, until your health care provider says that it is safe. °· Ask your health care provider when it is okay to: °? Return to work or school. °? Resume usual physical activities or sports. °? Resume sexual activity. °General instructions °· If the catheter site starts to bleed, raise your arm and put firm pressure on the site. If the bleeding does not stop, get help right away. This is a medical emergency. °· If you went home on the same day as your procedure, a responsible adult should be with you for the first 24 hours after you arrive home. °· Keep all follow-up visits as told by your health care provider. This is important. °Contact a health care provider if: °· You have a fever. °· You have redness, swelling, or yellow drainage around your insertion site. °Get help right away if: °· You have unusual pain at the radial site. °· The catheter insertion area swells very fast. °· The insertion area is bleeding, and the bleeding does not stop when you hold steady pressure on the area. °· Your arm or hand becomes pale, cool, tingly, or numb. °These symptoms may represent a serious problem   that is an emergency. Do not wait to see if the symptoms will go away. Get medical help right away. Call your local emergency services (911 in the [place] Do not drive yourself to the hospital. °Summary °· After the procedure, it is common to have bruising and tenderness at the site. °· Follow instructions from your health care provider about how to take care of your radial site wound. Check the wound every day for signs of infection. °· Do not lift anything that is heavier than 10 lb (4.5 kg), or the limit that you are told, until your health care provider says  that it is safe. °This information is not intended to replace advice given to you by your health care provider. Make sure you discuss any questions you have with your health care provider. °Document Released: 08/21/2010 Document Revised: 08/24/2017 Document Reviewed: 08/24/2017 °Elsevier Interactive Patient Education © 2019 [employer] Inc[employer] ° ° ° °Moderate Conscious Sedation, Adult, Care After °These instructions provide you with information about caring for yourself after your procedure. Your health care provider may also give you more specific instructions. Your treatment has been planned according to current medical practices, but problems sometimes occur. Call your health care provider if you have any problems or questions after your procedure. °What can I expect after the procedure? °After your procedure, it is common: °· To feel sleepy for several hours. °· To feel clumsy and have poor balance for several hours. °· To have poor judgment for several hours. °· To vomit if you eat too soon. °Follow these instructions at home: °For at least 24 hours after the procedure: ° °· Do not: °? Participate in activities where you could fall or become injured. °? Drive. °? Use heavy machinery. °? Drink alcohol. °? Take sleeping pills or medicines that cause drowsiness. °? Make important decisions or sign legal documents. °? Take care of children on your own. °· Rest. °Eating and drinking °· Follow the diet recommended by your health care provider. °· If you vomit: °? Drink water, juice, or soup when you can drink without vomiting. °? Make sure you have little or no nausea before eating solid foods. °General instructions °· Have a responsible adult stay with you until you are awake and alert. °· Take over-the-counter and prescription medicines only as told by your health care provider. °· If you smoke, do not smoke without supervision. °· Keep all follow-up visits as told by your health care provider. This is  important. °Contact a health care provider if: °· You keep feeling nauseous or you keep vomiting. °· You feel light-headed. °· You develop a rash. °· You have a fever. °Get help right away if: °· You have trouble breathing. °This information is not intended to replace advice given to you by your health care provider. Make sure you discuss any questions you have with your health care provider. °Document Released: 05/09/2013 Document Revised: 12/22/2015 Document Reviewed: 11/08/2015 °Elsevier Interactive Patient Education © 2019 Elsevier Inc. ° °

## 2018-10-12 NOTE — Progress Notes (Signed)
Client arrived from cath lab via stretcher and downtime forms used until now

## 2018-10-13 ENCOUNTER — Encounter (HOSPITAL_COMMUNITY): Payer: Self-pay | Admitting: Cardiovascular Disease

## 2018-10-17 ENCOUNTER — Ambulatory Visit (HOSPITAL_COMMUNITY)
Admission: RE | Admit: 2018-10-17 | Discharge: 2018-10-17 | Disposition: A | Discharge status: Home / Self Care | Payer: Medicare HMO | Source: Ambulatory Visit | Attending: Cardiovascular Disease | Admitting: Cardiovascular Disease

## 2018-10-17 ENCOUNTER — Ambulatory Visit (HOSPITAL_BASED_OUTPATIENT_CLINIC_OR_DEPARTMENT_OTHER)
Admission: RE | Admit: 2018-10-17 | Discharge: 2018-10-17 | Disposition: A | Discharge status: Home / Self Care | Payer: Medicare HMO | Source: Ambulatory Visit | Attending: Cardiovascular Disease | Admitting: Cardiovascular Disease

## 2018-10-17 ENCOUNTER — Other Ambulatory Visit: Payer: Self-pay

## 2018-10-17 ENCOUNTER — Encounter (HOSPITAL_COMMUNITY): Payer: Self-pay

## 2018-10-17 DIAGNOSIS — I350 Nonrheumatic aortic (valve) stenosis: Secondary | ICD-10-CM

## 2018-10-17 DIAGNOSIS — K5730 Diverticulosis of large intestine without perforation or abscess without bleeding: Secondary | ICD-10-CM | POA: Diagnosis not present

## 2018-10-17 DIAGNOSIS — Z8679 Personal history of other diseases of the circulatory system: Secondary | ICD-10-CM | POA: Diagnosis not present

## 2018-10-17 LAB — PULMONARY FUNCTION TEST
DL/VA % pred: 94 %
DL/VA: 3.75 ml/min/mmHg/L
DLCO cor % pred: 89 %
DLCO cor: 22.43 ml/min/mmHg
DLCO unc % pred: 98 %
DLCO unc: 24.57 ml/min/mmHg
FEF 25-75 Post: 4.06 L/sec
FEF 25-75 Pre: 3.04 L/sec
FEF2575-%Change-Post: 33 %
FEF2575-%Pred-Post: 186 %
FEF2575-%Pred-Pre: 139 %
FEV1-%CHANGE-POST: 7 %
FEV1-%PRED-POST: 120 %
FEV1-%Pred-Pre: 112 %
FEV1-Post: 3.63 L
FEV1-Pre: 3.39 L
FEV1FVC-%Change-Post: 9 %
FEV1FVC-%PRED-PRE: 107 %
FEV6-%Change-Post: 0 %
FEV6-%Pred-Post: 108 %
FEV6-%Pred-Pre: 108 %
FEV6-Post: 4.26 L
FEV6-Pre: 4.28 L
FEV6FVC-%Change-Post: 1 %
FEV6FVC-%Pred-Post: 106 %
FEV6FVC-%Pred-Pre: 105 %
FVC-%CHANGE-POST: -1 %
FVC-%Pred-Post: 101 %
FVC-%Pred-Pre: 103 %
FVC-Post: 4.26 L
FVC-Pre: 4.34 L
PRE FEV6/FVC RATIO: 99 %
Post FEV1/FVC ratio: 85 %
Post FEV6/FVC ratio: 100 %
Pre FEV1/FVC ratio: 78 %
RV % pred: 108 %
RV: 2.8 L
TLC % pred: 103 %
TLC: 7.33 L

## 2018-10-17 MED ORDER — ALBUTEROL SULFATE (2.5 MG/3ML) 0.083% IN NEBU
2.5000 mg | INHALATION_SOLUTION | Freq: Once | RESPIRATORY_TRACT | Status: AC
  Administered 2018-10-17: 2.5 mg via RESPIRATORY_TRACT

## 2018-10-17 MED ORDER — IOHEXOL 350 MG/ML SOLN
95.0000 mL | Freq: Once | INTRAVENOUS | Status: AC | PRN
  Administered 2018-10-17: 95 mL via INTRAVENOUS

## 2018-10-17 MED ORDER — IOHEXOL 350 MG/ML SOLN: 95.0000 mL | Freq: Once | INTRAVENOUS | Status: DC | PRN

## 2018-10-17 NOTE — Progress Notes (Signed)
Carotid artery duplex has been completed. Preliminary results can be found in CV Proc through chart review.   10/17/18 2:06 PM Aaron Foley RVT

## 2018-10-24 DIAGNOSIS — E559 Vitamin D deficiency, unspecified: Secondary | ICD-10-CM | POA: Diagnosis not present

## 2018-10-24 DIAGNOSIS — E538 Deficiency of other specified B group vitamins: Secondary | ICD-10-CM | POA: Diagnosis not present

## 2018-10-24 DIAGNOSIS — Z79899 Other long term (current) drug therapy: Secondary | ICD-10-CM | POA: Diagnosis not present

## 2018-10-24 DIAGNOSIS — K4090 Unilateral inguinal hernia, without obstruction or gangrene, not specified as recurrent: Secondary | ICD-10-CM | POA: Diagnosis not present

## 2018-10-24 DIAGNOSIS — Z1339 Encounter for screening examination for other mental health and behavioral disorders: Secondary | ICD-10-CM | POA: Diagnosis not present

## 2018-10-24 DIAGNOSIS — Z6831 Body mass index (BMI) 31.0-31.9, adult: Secondary | ICD-10-CM | POA: Diagnosis not present

## 2018-10-24 DIAGNOSIS — E291 Testicular hypofunction: Secondary | ICD-10-CM | POA: Diagnosis not present

## 2018-10-24 DIAGNOSIS — R7301 Impaired fasting glucose: Secondary | ICD-10-CM | POA: Diagnosis not present

## 2018-10-24 DIAGNOSIS — E039 Hypothyroidism, unspecified: Secondary | ICD-10-CM | POA: Diagnosis not present

## 2018-10-24 DIAGNOSIS — Z7189 Other specified counseling: Secondary | ICD-10-CM | POA: Diagnosis not present

## 2018-10-24 DIAGNOSIS — Z0000 Encounter for general adult medical examination without abnormal findings: Secondary | ICD-10-CM | POA: Diagnosis not present

## 2018-10-24 DIAGNOSIS — Z1331 Encounter for screening for depression: Secondary | ICD-10-CM | POA: Diagnosis not present

## 2018-10-24 DIAGNOSIS — R790 Abnormal level of blood mineral: Secondary | ICD-10-CM | POA: Diagnosis not present

## 2018-10-25 ENCOUNTER — Encounter: Payer: Medicare HMO | Admitting: Thoracic Surgery (Cardiothoracic Vascular Surgery)

## 2018-10-25 ENCOUNTER — Ambulatory Visit: Payer: Medicare HMO | Admitting: Physical Therapy

## 2018-11-08 ENCOUNTER — Telehealth: Payer: Self-pay

## 2018-11-08 NOTE — Telephone Encounter (Signed)
  [facility]   MULTIDISCIPLINARY HEART VALVE TEAM  The pt was scheduled for TAVR evaluation with Dr Roxy Manns on 11/20/2018. I contacted the pt to see how he is feeling at this time.  The pt doing well with no new or worsening symptoms related to severe AS.  The pt has been able to continue his daily walks outside without symptoms and he is following guidelines in regards to social distancing.  Due to Covid-19 restrictions at this time I have rescheduled the pt's appointment with Dr Roxy Manns to 01/01/2019.  The pt was advised to call with any additional questions or concerns.  The pt will also contact us if he develops new or worsening SOB, chest pain/tightness, dizziness or passing out.  Pt agreed with plan.

## 2018-11-10 NOTE — Telephone Encounter (Signed)
Thanks Lauren!

## 2018-11-14 ENCOUNTER — Telehealth: Payer: Self-pay

## 2018-11-14 ENCOUNTER — Other Ambulatory Visit: Payer: Self-pay

## 2018-11-14 DIAGNOSIS — I350 Nonrheumatic aortic (valve) stenosis: Secondary | ICD-10-CM

## 2018-11-14 NOTE — Telephone Encounter (Signed)
  [facility]   MULTIDISCIPLINARY HEART VALVE TEAM  The pt contacted Dr [facility] office in regards to symptoms that he developed over the past few days. Dr Bettina Gavia notified Dr Roxy Manns and I was asked to arrange further evaluation for the patient.   Per Mr Happel he developed dizziness over the last 2-3 days.  Last night when the pt got up from his chair he felt so dizzy that he almost fell down. This morning the pt got up around 4 AM to use the restroom and when he returned to bed he developed 7-8/10 chest pressure in the center of his chest and described this as "something sitting on his chest".  The pt states that this feeling was different than the tightness that he experiences in his chest when he walks. The pt denies left arm numbness, tingling or pain.  The pt did become diaphoretic with chest pressure. The pt laid in bed and tried to change positions to see if symptoms resolved.  The pt states that chest pressure continued to come and go and lasted for almost an hour.  The pt finally got out of bed and symptoms eased off.  BP 124/71, pulse 59.  The pt denies any additional symptoms since this episode.  I advised the pt to avoid exertional activity going forward. If the pt develops additional symptoms he was advised to call 911 for evaluation.  The pt has been scheduled for TAVR evaluation with Dr Roxy Manns on 4/17 and Pre Admission testing.  The pt is posted for Transcatheter Aortic Valve Replacement, Transfemoral Approach on 11/21/2018 with Drs Angelena Form and Roxy Manns.

## 2018-11-14 NOTE — Telephone Encounter (Signed)
Has this patient traveled out of state?  No Do they have cough and /or fever?  No Are they begin tested for COVID 19 or are they COVID positive?  No

## 2018-11-15 ENCOUNTER — Other Ambulatory Visit: Payer: Self-pay

## 2018-11-15 ENCOUNTER — Other Ambulatory Visit: Payer: Self-pay | Admitting: Physician Assistant

## 2018-11-16 NOTE — Progress Notes (Signed)
[place], [place] [address] Phone: [phone] Fax: [phone]      Your procedure is scheduled on 11/21/2018.  Report to [facility] Main Entrance "A" Then check in at Admitting at [facility].  Call this number if you have problems the morning of surgery:  [phone]  Call [phone] if you have any questions prior to your surgery date Monday-Friday 8am-4pm    Remember:  Do not eat or drink after midnight.   Take these medicines the morning of surgery with A SIP OF WATER: NONE   7 days prior to surgery STOP taking any Aspirin (unless otherwise instructed by your surgeon), Aleve, Naproxen, Ibuprofen, Motrin, Advil, Goody's, BC's, all herbal medications, fish oil, and all vitamins.    The Morning of Surgery  Do not wear jewelry, make-up or nail polish.  Do not wear lotions, powders, or perfumes/colognes, or deodorant  Do not shave 48 hours prior to surgery.  Men may shave face and neck.  Do not bring valuables to the hospital.  [facility] is not responsible for any belongings or valuables.  If you are a smoker, DO NOT Smoke 24 hours prior to surgery  REMEMBER THAT YOU MUST SOMEONE TO TRANSPORT YOU HOME AFTER SURGERY IF YOU ARE DISCHARGED THE SAME DAY OF SURGERY  YOU WILL ALSO NEED SOMEONE TO STAY WITH YOU FOR 24 HOURS AFTER SURGERY IF YOU ARE DISCHARGED THE SAME DAY OF SURGERY   Contacts, glasses, hearing aids, dentures or bridgework may not be worn into surgery.   If you wear a CPAP at night please bring your mask, tubing, and machine the morning of surgery   Leave your suitcase in the car.  After surgery it may be brought to your room.  For patients admitted to the hospital, discharge time will be determined by your treatment team.  Patients discharged the day of surgery will not be allowed to drive home.    Special instructions:   [facility]- Preparing For Surgery  Before surgery, you can play an  important role. Because skin is not sterile, your skin needs to be as free of germs as possible. You can reduce the number of germs on your skin by washing with CHG (chlorahexidine gluconate) Soap before surgery.  CHG is an antiseptic cleaner which kills germs and bonds with the skin to continue killing germs even after washing.    Oral Hygiene is also important to reduce your risk of infection.  Remember - BRUSH YOUR TEETH THE MORNING OF SURGERY WITH YOUR REGULAR TOOTHPASTE  Please do not use if you have an allergy to CHG or antibacterial soaps. If your skin becomes reddened/irritated stop using the CHG.  Do not shave (including legs and underarms) for at least 48 hours prior to first CHG shower. It is OK to shave your face.  Please follow these instructions carefully.   1. Shower the NIGHT BEFORE SURGERY and the MORNING OF SURGERY with CHG Soap.   2. If you chose to wash your hair, wash your hair first as usual with your normal shampoo.  3. After you shampoo, rinse your hair and body thoroughly to remove the shampoo.  4. Use CHG as you would any other liquid soap. You can apply CHG directly to the skin and wash gently with a scrungie or a clean washcloth.   5. Apply the CHG Soap to your body ONLY FROM THE NECK DOWN.  Do not use on  open wounds or open sores. Avoid contact with your eyes, ears, mouth and genitals (private parts). Wash Face and genitals (private parts)  with your normal soap.   6. Wash thoroughly, paying special attention to the area where your surgery will be performed.  7. Thoroughly rinse your body with warm water from the neck down.  8. DO NOT shower/wash with your normal soap after using and rinsing off the CHG Soap.  9. Pat yourself dry with a CLEAN TOWEL.  10. Wear CLEAN PAJAMAS to bed the night before surgery, wear comfortable clothes the morning of surgery  11. Place CLEAN SHEETS on your bed the night of your first shower and DO NOT SLEEP WITH PETS.    Day of  Surgery:  Do not apply any deodorants/lotions.  Please wear clean clothes to the hospital/surgery center.   Remember to brush your teeth WITH YOUR REGULAR TOOTHPASTE.   Please read over the following fact sheets that you were given.

## 2018-11-16 NOTE — Telephone Encounter (Signed)
I will do a day of service note. Aaron Foley

## 2018-11-17 ENCOUNTER — Other Ambulatory Visit: Payer: Self-pay

## 2018-11-17 ENCOUNTER — Ambulatory Visit (HOSPITAL_COMMUNITY)
Admission: RE | Admit: 2018-11-17 | Discharge: 2018-11-17 | Disposition: A | Discharge status: Home / Self Care | Payer: Medicare HMO | Source: Ambulatory Visit | Attending: Cardiovascular Disease | Admitting: Cardiovascular Disease

## 2018-11-17 ENCOUNTER — Encounter: Payer: Self-pay | Admitting: Thoracic Surgery (Cardiothoracic Vascular Surgery)

## 2018-11-17 ENCOUNTER — Institutional Professional Consult (permissible substitution): Payer: Medicare HMO | Admitting: Thoracic Surgery (Cardiothoracic Vascular Surgery)

## 2018-11-17 ENCOUNTER — Ambulatory Visit: Payer: Medicare HMO | Attending: Cardiovascular Disease | Admitting: Physical Therapy

## 2018-11-17 ENCOUNTER — Other Ambulatory Visit: Payer: Self-pay | Admitting: Thoracic Surgery (Cardiothoracic Vascular Surgery)

## 2018-11-17 ENCOUNTER — Encounter (HOSPITAL_COMMUNITY)
Admission: RE | Admit: 2018-11-17 | Discharge: 2018-11-17 | Disposition: A | Discharge status: Home / Self Care | Payer: Medicare HMO | Source: Ambulatory Visit | Attending: Cardiovascular Disease | Admitting: Cardiovascular Disease

## 2018-11-17 ENCOUNTER — Encounter (HOSPITAL_COMMUNITY): Payer: Self-pay

## 2018-11-17 ENCOUNTER — Encounter: Payer: Self-pay | Admitting: Physical Therapy

## 2018-11-17 VITALS — BP 124/74 | HR 68 | Temp 97.1°F | Resp 20 | Ht 72.0 in | Wt 218.0 lb

## 2018-11-17 DIAGNOSIS — Z79890 Hormone replacement therapy (postmenopausal): Secondary | ICD-10-CM | POA: Diagnosis not present

## 2018-11-17 DIAGNOSIS — I350 Nonrheumatic aortic (valve) stenosis: Secondary | ICD-10-CM

## 2018-11-17 DIAGNOSIS — Z87891 Personal history of nicotine dependence: Secondary | ICD-10-CM | POA: Diagnosis not present

## 2018-11-17 DIAGNOSIS — R2689 Other abnormalities of gait and mobility: Secondary | ICD-10-CM | POA: Diagnosis not present

## 2018-11-17 DIAGNOSIS — I10 Essential (primary) hypertension: Secondary | ICD-10-CM | POA: Diagnosis not present

## 2018-11-17 DIAGNOSIS — E039 Hypothyroidism, unspecified: Secondary | ICD-10-CM | POA: Insufficient documentation

## 2018-11-17 DIAGNOSIS — Z01818 Encounter for other preprocedural examination: Secondary | ICD-10-CM | POA: Diagnosis not present

## 2018-11-17 DIAGNOSIS — I2510 Atherosclerotic heart disease of native coronary artery without angina pectoris: Secondary | ICD-10-CM | POA: Diagnosis not present

## 2018-11-17 DIAGNOSIS — Z79899 Other long term (current) drug therapy: Secondary | ICD-10-CM | POA: Insufficient documentation

## 2018-11-17 DIAGNOSIS — E7800 Pure hypercholesterolemia, unspecified: Secondary | ICD-10-CM | POA: Insufficient documentation

## 2018-11-17 HISTORY — DX: Presence of spectacles and contact lenses: Z97.3

## 2018-11-17 LAB — URINALYSIS, ROUTINE W REFLEX MICROSCOPIC
Bilirubin Urine: NEGATIVE
Glucose, UA: NEGATIVE mg/dL
Hgb urine dipstick: NEGATIVE
Ketones, ur: NEGATIVE mg/dL
Leukocytes,Ua: NEGATIVE
Nitrite: NEGATIVE
Protein, ur: NEGATIVE mg/dL
Specific Gravity, Urine: 1.005 (ref 1.005–1.030)
pH: 7 (ref 5.0–8.0)

## 2018-11-17 LAB — BLOOD GAS, ARTERIAL
Acid-Base Excess: 0.9 mmol/L (ref 0.0–2.0)
Bicarbonate: 24.8 mmol/L (ref 20.0–28.0)
Drawn by: 449841
FIO2: 21
O2 Saturation: 96.9 %
Patient temperature: 98.6
pCO2 arterial: 38.1 mmHg (ref 32.0–48.0)
pH, Arterial: 7.429 (ref 7.350–7.450)
pO2, Arterial: 92.4 mmHg (ref 83.0–108.0)

## 2018-11-17 LAB — COMPREHENSIVE METABOLIC PANEL
ALT: 29 U/L (ref 0–44)
AST: 28 U/L (ref 15–41)
Albumin: 3.8 g/dL (ref 3.5–5.0)
Alkaline Phosphatase: 41 U/L (ref 38–126)
Anion gap: 13 (ref 5–15)
BUN: 16 mg/dL (ref 8–23)
CO2: 19 mmol/L — ABNORMAL LOW (ref 22–32)
Calcium: 10 mg/dL (ref 8.9–10.3)
Chloride: 104 mmol/L (ref 98–111)
Creatinine, Ser: 0.79 mg/dL (ref 0.61–1.24)
GFR calc Af Amer: >60 mL/min (ref >60)
GFR calc non Af Amer: >60 mL/min (ref >60)
Glucose, Bld: 97 mg/dL (ref 70–99)
Potassium: 4.5 mmol/L (ref 3.5–5.1)
Sodium: 136 mmol/L (ref 135–145)
Total Bilirubin: 1 mg/dL (ref 0.3–1.2)
Total Protein: 6.7 g/dL (ref 6.5–8.1)

## 2018-11-17 LAB — CBC
HCT: 50.8 % (ref 39.0–52.0)
Hemoglobin: 17.7 g/dL — ABNORMAL HIGH (ref 13.0–17.0)
MCH: 33.4 pg (ref 26.0–34.0)
MCHC: 34.8 g/dL (ref 30.0–36.0)
MCV: 95.8 fL (ref 80.0–100.0)
Platelets: 209 10*3/uL (ref 150–400)
RBC: 5.3 MIL/uL (ref 4.22–5.81)
RDW: 11.5 % (ref 11.5–15.5)
WBC: 5.2 10*3/uL (ref 4.0–10.5)
nRBC: 0 % (ref 0.0–0.2)

## 2018-11-17 LAB — SURGICAL PCR SCREEN
MRSA, PCR: NEGATIVE
Staphylococcus aureus: POSITIVE — AB

## 2018-11-17 LAB — TYPE AND SCREEN
ABO/RH(D): A POS
Antibody Screen: NEGATIVE

## 2018-11-17 LAB — BRAIN NATRIURETIC PEPTIDE: B Natriuretic Peptide: 41.6 pg/mL (ref 0.0–100.0)

## 2018-11-17 LAB — PROTIME-INR
INR: 1 (ref 0.8–1.2)
Prothrombin Time: 13.3 seconds (ref 11.4–15.2)

## 2018-11-17 LAB — HEMOGLOBIN A1C
Hgb A1c MFr Bld: 5.5 % (ref 4.8–5.6)
Mean Plasma Glucose: 111.15 mg/dL

## 2018-11-17 LAB — ABO/RH: ABO/RH(D): A POS

## 2018-11-17 LAB — APTT: aPTT: 26 seconds (ref 24–36)

## 2018-11-17 NOTE — H&P (View-Only) (Signed)
[facility]  MULTIDISCIPLINARY HEART VALVE CLINIC  CARDIOTHORACIC SURGERY CONSULTATION REPORT  Referring Provider is Dulce Sellar, Iline Oven, MD PCP is Philemon Kingdom, MD  Chief Complaint  Patient presents with  . Aortic Stenosis    Surgical eval for TAVR, review all studies    HPI:  Patient is a 78 year old male with history of aortic stenosis, hypertension, hypercholesterolemia, and hypothyroidism who has been referred for surgical consultation to discuss treatment options for management of severe symptomatic aortic stenosis.  Patient has remained physically active and relatively healthy all of his adult life.  Approximately 6 years ago he was noted to have a heart murmur on physical exam by his primary care physician.  He was diagnosed with aortic stenosis based on transthoracic echocardiogram and referred to Dr. Dulce Sellar who has been following him carefully for the past several years.  Follow-up echocardiograms have documented gradual progression of aortic stenosis with preserved left ventricular systolic function.  Most recent follow-up echocardiogram was performed September 27, 2018 and revealed severe aortic stenosis with peak velocity across aortic valve measured 4.4 m/s corresponding to mean transvalvular gradient estimated 49 mmHg and aortic valve area calculated 0.70 cm.  Left ventricular systolic function remain normal.  The patient was referred to the multidisciplinary heart valve clinic and underwent diagnostic cardiac catheterization by Dr. Clifton James on October 12, 2018.  Catheterization revealed mild nonobstructive coronary artery disease and confirmed the presence of severe aortic stenosis with peak to peak and mean transvalvular gradients measured 48 and 40.8 mmHg, respectively.  Right heart pressures were normal.    The patient is married and lives with his wife in [place].  He is retired from the as per police department.  He has remained physically active throughout  retirement.  Both he and his wife enjoy hiking and kayaking.  Beginning last summer the patient began to experience intermittent dizzy spells, exertional chest tightness, and exertional shortness of breath.  The patient was referred for surgical consultation several weeks ago but initially his consultation was postponed because of the ongoing COVID-19 pandemic.  However, symptoms have accelerated considerably over the past 6 weeks.  The patient reports frequent dizzy spells that can occur at anytime.  He has not had a syncopal episode.  He now gets chest tightness with moderate level activity.  He gets shortness of breath with moderate level activity.  Several days ago he was awoke from sleep with chest tightness that took 10 to 15 minutes to gradually resolve.  He has not had resting shortness of breath, PND, orthopnea, or lower extremity edema.  He telephoned Dr. Hulen Shouts office and a decision was made to accelerate his referral for definitive surgical intervention.  The patient denies any recent symptoms of fever, chills, or new cough.  He denies resting shortness of breath.  He and his wife have been practicing strict social distancing and have stayed at home by themselves for the last several weeks.  They have not traveled in several months.  They have not been around any other persons with known or suspected COVID-19 infection.  Past Medical History:  Diagnosis Date  . Closed left ankle fracture 2014  . Disc disease, degenerative, cervical   . Diverticulosis    mild left colonic  . Dyspnea    with exertion  . Heart murmur   . Hypercholesterolemia 08/10/2017  . Hypertension 08/10/2017  . Hypothyroidism 08/10/2017  . Low testosterone in male 08/10/2017  . Low vitamin B12 level 08/10/2017  . Mild aortic stenosis  08/10/2017  . Neck pain 08/10/2017  . 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 Vitamin D deficiency 08/10/2017  . Wears  glasses    readers    Past Surgical History:  Procedure Laterality Date  . COLONOSCOPY    . MENISCUS REPAIR Left   . RIGHT/LEFT HEART CATH AND CORONARY ANGIOGRAPHY N/A 10/12/2018   Procedure: RIGHT/LEFT HEART CATH AND CORONARY ANGIOGRAPHY;  Surgeon: Kathleene Hazel, MD;  Location: [facility];  Service: Cardiovascular;  Laterality: N/A;  . UMBILICAL HERNIA REPAIR      Family History  Problem Relation Age of Onset  . Leukemia Mother   . Heart disease Father   . Diabetes Maternal Grandmother   . Hypertension Maternal Grandmother   . CAD Paternal Grandfather     Social History   Socioeconomic History  . Marital status: Married    Spouse name: Not on file  . Number of children: Not on file  . Years of education: Not on file  . Highest education level: Not on file  Occupational History  . Not on file  Social Needs  . Financial resource strain: Not on file  . Food insecurity:    Worry: Not on file    Inability: Not on file  . Transportation needs:    Medical: Not on file    Non-medical: Not on file  Tobacco Use  . Smoking status: Former Smoker    Packs/day: 1.00    Years: 10.00    Pack years: 10.00    Types: Cigarettes    Last attempt to quit: 1969    Years since quitting: 51.3  . Smokeless tobacco: Never Used  Substance and Sexual Activity  . Alcohol use: Not Currently  . Drug use: No  . Sexual activity: Not on file  Lifestyle  . Physical activity:    Days per week: Not on file    Minutes per session: Not on file  . Stress: Not on file  Relationships  . Social connections:    Talks on phone: Not on file    Gets together: Not on file    Attends religious service: Not on file    Active member of club or organization: Not on file    Attends meetings of clubs or organizations: Not on file    Relationship status: Not on file  . Intimate partner violence:    Fear of current or ex partner: Not on file    Emotionally abused: Not on file     Physically abused: Not on file    Forced sexual activity: Not on file  Other Topics Concern  . Not on file  Social History Narrative  . Not on file    Current Outpatient Medications  Medication Sig Dispense Refill  . anastrozole (ARIMIDEX) 1 MG tablet Take 0.25 mg by mouth 2 (two) times a week. Pt states he is taking 1/4 of a tablet 2 times a week    . ARMOUR THYROID 30 MG tablet Take 30 mg by mouth daily before breakfast.   4  . cholecalciferol (VITAMIN D3) 25 MCG (1000 UT) tablet Take 1,000 Units by mouth daily.    . clindamycin (CLEOCIN) 300 MG capsule TAKE 2 CAPSULES BY MOUTH DAILY *take 30 minutes before appointment* (Patient taking differently: Take 600 mg by mouth See admin instructions. FOR DENTAL PROCEDURES) 10 capsule 2  . Glucosamine-MSM-Hyaluronic Acd (JOINT HEALTH PO) Take  1 capsule by mouth daily.    . Melatonin 1 MG TABS Take 0.5 tablets by mouth at bedtime.     . Menaquinone-7 (VITAMIN K2 PO) Take 1 tablet by mouth daily.    . Multiple Vitamins-Minerals (MULTIVITAMIN WITH MINERALS) tablet Take 1 tablet by mouth daily.    . sildenafil (REVATIO) 20 MG tablet Take 20 mg by mouth daily as needed (ED).     Marland Kitchen telmisartan (MICARDIS) 40 MG tablet Take 40 mg by mouth at bedtime.     . Testosterone 20 % CREA Apply 1 application topically daily.     . vitamin C (ASCORBIC ACID) 500 MG tablet Take 500 mg by mouth daily.     No current facility-administered medications for this visit.     Allergies  Allergen Reactions  . Penicillins Rash    Did it involve swelling of the face/tongue/throat, SOB, or low BP? No Did it involve sudden or severe rash/hives, skin peeling, or any reaction on the inside of your mouth or nose? Yes Did you need to seek medical attention at a hospital or doctor's office? Yes When did it last happen?Long time ago  If all above answers are "NO", may proceed with cephalosporin use.       Review of Systems:   General:  normal appetite, normal  energy, no weight gain, no weight loss, no fever  Cardiac:  + chest pain with exertion, one episode chest pain at rest, +SOB with exertion, no resting SOB, no PND, no orthopnea, no palpitations, no arrhythmia, no atrial fibrillation, no LE edema, + dizzy spells, no syncope  Respiratory:  + exertional shortness of breath, no home oxygen, no productive cough, no dry cough, no bronchitis, no wheezing, no hemoptysis, no asthma, no pain with inspiration or cough, no sleep apnea, no CPAP at night  GI:   no difficulty swallowing, no reflux, no frequent heartburn, no hiatal hernia, no abdominal pain, no constipation, no diarrhea, no hematochezia, no hematemesis, no melena  GU:   no dysuria,  + frequency, no urinary tract infection, no hematuria, + enlarged prostate, no kidney stones, no kidney disease  Vascular:  no pain suggestive of claudication, no pain in feet, no leg cramps, no varicose veins, no DVT, no non-healing foot ulcer  Neuro:   no stroke, no TIA's, no seizures, no headaches, no temporary blindness one eye,  no slurred speech, no peripheral neuropathy, no chronic pain, no instability of gait, no memory/cognitive dysfunction  Musculoskeletal: + arthritis in neck, no joint swelling, no myalgias, no difficulty walking, normal mobility   Skin:   no rash, no itching, no skin infections, no pressure sores or ulcerations  Psych:   no anxiety, no depression, no nervousness, no unusual recent stress  Eyes:   no blurry vision, no floaters, no recent vision changes, + wears glasses for reading  ENT:   no hearing loss, no loose or painful teeth, no dentures, last saw dentist November 2019  Hematologic:  no easy bruising, no abnormal bleeding, no clotting disorder, no frequent epistaxis  Endocrine:  no diabetes, does not check CBG's at home           Physical Exam:   BP 124/74   Pulse 68   Temp (!) 97.1 F (36.2 C) (Oral)   Resp 20   Ht 6' (1.829 m)   Wt 218 lb (98.9 kg)   SpO2 98% Comment: RA   BMI 29.57 kg/m   General:    well-appearing  HEENT:  Unremarkable   Neck:   no JVD, no bruits, no adenopathy   Chest:   clear to auscultation, symmetrical breath sounds, no wheezes, no rhonchi   CV:   RRR, grade III/VI crescendo/decrescendo murmur heard best at RSB,  no diastolic murmur  Abdomen:  soft, non-tender, no masses   Extremities:  warm, well-perfused, pulses palpable, no LE edema  Rectal/GU  Deferred  Neuro:   Grossly non-focal and symmetrical throughout  Skin:   Clean and dry, no rashes, no breakdown   Diagnostic Tests:  EKG: NSR w/out significant AV conduction delay    ECHOCARDIOGRAM REPORT    Patient Name:   Aaron Foley Date of Exam: 09/27/2018 Medical Rec #:  [id number]          Height:       71.0 in Accession #:    [id number]         Weight:       223.4 lb Date of Birth:  1940/10/24           BSA:          2.21 m Patient Age:    77 years           BP:           134/84 mmHg Patient Gender: M                  HR:           64 bpm. Exam Location:  [facility]    Procedure: 2D Echo  Indications:    Aortic Stenosis 424.1 / 135.0   History:        Patient has prior history of Echocardiogram examinations, most                 recent 07/21/2017. Signs/Symptoms: Chest Pain; Risk Factors:                 Hypertension and Dyslipidemia.   Sonographer:    Louie Boston Referring Phys: [phone] BRIAN J MUNLEY  IMPRESSIONS    1. The left ventricle has normal systolic function, with an ejection fraction of 55-60%. The cavity size was normal. There is moderate concentric left ventricular hypertrophy. Left ventricular diastolic Doppler parameters are consistent with impaired  relaxation Elevated left ventricular end-diastolic pressure No evidence of left ventricular regional wall motion abnormalities.  2. The right ventricle has normal systolic function. The cavity was normal. There is no increase in right ventricular wall thickness.  3. Left atrial size was mildly  dilated.  4. The mitral valve is degenerative. There is mild to moderate mitral annular calcification present.  5. The aortic valve is tricuspid Severely thickening of the aortic valve Mild calcification of the aortic valve. Aortic valve regurgitation is mild by color flow Doppler. severe stenosis of the aortic valve.  6. The aortic root, ascending aorta and aortic arch are normal in size and structure.  7. The inferior vena cava was dilated in size with >50% respiratory variability.  8. No evidence of left ventricular regional wall motion abnormalities.  FINDINGS  Left Ventricle: The left ventricle has normal systolic function, with an ejection fraction of 55-60%. The cavity size was normal. There is moderate concentric left ventricular hypertrophy. Left ventricular diastolic Doppler parameters are consistent  with impaired relaxation Elevated left ventricular end-diastolic pressure No evidence of left ventricular regional wall motion abnormalities.. Right Ventricle: The right ventricle has normal systolic function. The cavity was normal. There is no increase in right ventricular  wall thickness. Left Atrium: left atrial size was mildly dilated Right Atrium: right atrial size was normal in size. Right atrial pressure is estimated at 8 mmHg. Interatrial Septum: No atrial level shunt detected by color flow Doppler. Pericardium: There is no evidence of pericardial effusion. Mitral Valve: The mitral valve is degenerative in appearance. There is mild to moderate mitral annular calcification present. Mitral valve regurgitation is not visualized by color flow Doppler. Tricuspid Valve: The tricuspid valve is normal in structure. Tricuspid valve regurgitation was not visualized by color flow Doppler. Aortic Valve: The aortic valve is tricuspid Severely thickening of the aortic valve Mild calcification of the aortic valve, with severely decreased cusp excursion. Aortic valve regurgitation is mild by color  flow Doppler. There is severe stenosis of the  aortic valve, with a calculated valve area of 0.70 cm. Pulmonic Valve: The pulmonic valve was normal in structure. Pulmonic valve regurgitation is not visualized by color flow Doppler. No evidence of pulmonic stenosis. Aorta: The aortic root, ascending aorta and aortic arch are normal in size and structure. Pulmonary Artery: The pulmonary artery is of normal size and origin. Venous: The inferior vena cava measures 2.10 cm, is dilated in size with greater than 50% respiratory variability.   LEFT VENTRICLE PLAX 2D (Teich) LV EF:          68.0 %   Diastology LVIDd:          4.50 cm  LV e' lateral:   5.66 cm/s LVIDs:          2.80 cm  LV E/e' lateral: 19.6 LV PW:          1.40 cm  LV e' medial:    6.42 cm/s LV IVS:         1.40 cm  LV E/e' medial:  17.3 LVOT diam:      2.00 cm LV SV:          63 ml LVOT Area:      3.14 cm  RIGHT VENTRICLE RV S prime:     11.00 cm/s TAPSE (M-mode): 3.5 cm  LEFT ATRIUM             Index       RIGHT ATRIUM           Index LA diam:        4.70 cm 2.13 cm/m  RA Pressure: 8 mmHg LA Vol (A2C):   88.7 ml 40.14 ml/m RA Area:     21.20 cm LA Vol (A4C):   59.4 ml 26.88 ml/m RA Volume:   70.40 ml  31.86 ml/m LA Biplane Vol: 77.8 ml 35.20 ml/m  AORTIC VALVE AV Area (Vmax):    0.78 cm AV Area (Vmean):   0.65 cm AV Area (VTI):     0.70 cm AV Vmax:           441.00 cm/s AV Vmean:          330.000 cm/s AV VTI:            1.170 m AV Peak Grad:      77.8 mmHg AV Mean Grad:      49.3 mmHg LVOT Vmax:         109.00 cm/s LVOT Vmean:        68.800 cm/s LVOT VTI:          0.261 m LVOT/AV VTI ratio: 0.22   AORTA Ao Root diam: 2.90 cm Ao Asc diam:  3.50 cm Ao Desc diam:  2.30 cm  MITRAL VALVE MV Area (PHT): 2.05 cm MV PHT:        107.30 msec MV Decel Time: 370 msec MV E velocity: 111.00 cm/s MV A velocity: 130.00 cm/s MV E/A ratio:  0.85  IVC IVC diam: 2.10 cm    Norman Herrlich MD  Electronically signed by Norman Herrlich MD Signature Date/Time: 09/27/2018/1:02:47 PM    RIGHT/LEFT HEART CATH AND CORONARY ANGIOGRAPHY  Conclusion     Prox RCA lesion is 20% stenosed.  Post Atrio lesion is 20% stenosed.  Mid Cx to Dist Cx lesion is 30% stenosed.  Ost 1st Mrg to 1st Mrg lesion is 40% stenosed.  Prox LAD to Mid LAD lesion is 20% stenosed.   1. Mild non-obstructive CAD 2. Severe aortic stenosis (mean gradient 40.8 mmHg, peak gradient 48 mmHg)  Recommendation: Will start workup for TAVR   Recommendations   Antiplatelet/Anticoag Proceed with TAVR workup.  Discharge Date In the absence of any other complications or medical issues, we expect the patient to be ready for discharge on 10/12/2018.  Indications   Severe aortic stenosis [I35.0 (ICD-10-CM)]  Procedural Details   Technical Details Indication: 78 yo male with severe AS  Procedure: The risks, benefits, complications, treatment options, and expected outcomes were discussed with the patient. The patient and/or family concurred with the proposed plan, giving informed consent. The patient was brought to the cath lab after IV hydration was given. The patient was further sedated with Versed and Fentanyl.  The IV catheter in the right antecubital vein was prepped and draped. This was changed for a 5 [place] sheath. Right heart catheterization performed with a balloon tipped catheter. The right wrist was prepped and draped in a sterile fashion. 1% lidocaine was used for local anesthesia. Using the modified Seldinger access technique, a 5 French sheath was placed in the right radial artery. 3 mg Verapamil was given through the sheath. 5000 units IV heparin was given. Standard diagnostic catheters were used to perform selective coronary angiography. I crossed the aortic valve with an AL-1 catheter and a straight wire. The sheath was removed from the right radial artery and a Terumo hemostasis band was applied at the  arteriotomy site on the right wrist.    Estimated blood loss <50 mL.   During this procedure medications were administered to achieve and maintain moderate conscious sedation while the patient's heart rate, blood pressure, and oxygen saturation were continuously monitored and I was present face-to-face 100% of this time.  Medications  (Filter: Administrations occurring from 10/12/18 0715 to 10/12/18 0845)  Medication Rate/Dose/Volume Action  Date Time   Heparin (Porcine) in NaCl 1000-0.9 UT/500ML-% SOLN (mL) 500 mL Given 10/12/18 0724   Total dose as of 11/17/18 1405        500 mL        Heparin (Porcine) in NaCl 1000-0.9 UT/500ML-% SOLN (mL) 500 mL Given 10/12/18 0724   Total dose as of 11/17/18 1405        500 mL        lidocaine (PF) (XYLOCAINE) 1 % injection (mL) 2 mL Given 10/12/18 0729   Total dose as of 11/17/18 1405 2 mL Given 0732   4 mL        midazolam (VERSED) injection (mg) 1 mg Given 10/12/18 0746   Total dose as of 11/17/18 1405        1 mg        fentaNYL (SUBLIMAZE) injection (mcg) 25 mcg Given 10/12/18 0747  Total dose as of 11/17/18 1405        25 mcg        heparin injection (Units) 5,000 Units Given 10/12/18 0753   Total dose as of 11/17/18 1405        5,000 Units        0.9 % sodium chloride infusion (mL/hr) 75 mL/hr Rate/Dose Change 10/12/18 0830   Dosing weight:  99.8 kg        Total dose as of 11/17/18 1405        280 mL        Sedation Time   Sedation Time Physician-1: 48 minutes 18 seconds  Complications   Complications documented before study signed (10/12/2018 12:46 PM EDT)    RIGHT/LEFT HEART CATH AND CORONARY ANGIOGRAPHY   None Documented by Kathleene Hazel, MD 10/12/2018 8:59 AM EDT  Time Range: Intraprocedure     None Documented by Kathleene Hazel, MD 10/12/2018 10:46 AM EDT  Time Range: Intraprocedure      Coronary Findings   Diagnostic  Dominance: Right  Left Anterior Descending  Prox LAD to Mid LAD lesion 20%  stenosed  Prox LAD to Mid LAD lesion is 20% stenosed.  Left Circumflex  Mid Cx to Dist Cx lesion 30% stenosed  Mid Cx to Dist Cx lesion is 30% stenosed.  First Obtuse Marginal Branch  Ost 1st Mrg to 1st Mrg lesion 40% stenosed  Ost 1st Mrg to 1st Mrg lesion is 40% stenosed.  Right Coronary Artery  Prox RCA lesion 20% stenosed  Prox RCA lesion is 20% stenosed.  Right Posterior Atrioventricular Branch  Post Atrio lesion 20% stenosed  Post Atrio lesion is 20% stenosed.  Intervention   No interventions have been documented.  Coronary Diagrams   Diagnostic  Dominance: Right    Intervention   Implants    No implant documentation for this case.  Syngo Images   Show images for CARDIAC CATHETERIZATION  MERGE Images   Show images for CARDIAC CATHETERIZATION   Link to Procedure Log   Procedure Log    Hemo Data    Most Recent Value  Fick Cardiac Output 5.75 L/min  Fick Cardiac Output Index 2.64 (L/min)/BSA  Aortic Mean Gradient 40.77 mmHg  Aortic Peak Gradient 48 mmHg  Aortic Valve Area 1.18  Aortic Value Area Index 0.54 cm2/BSA  RA A Wave 14 mmHg  RA V Wave 10 mmHg  RA Mean 9 mmHg  RV Systolic Pressure 33 mmHg  RV Diastolic Pressure 5 mmHg  RV EDP 11 mmHg  PA Systolic Pressure 32 mmHg  PA Diastolic Pressure 15 mmHg  PA Mean 20 mmHg  PW A Wave 22 mmHg  PW V Wave 19 mmHg  PW Mean 15 mmHg  AO Systolic Pressure 92 mmHg  AO Diastolic Pressure 54 mmHg  AO Mean 72 mmHg  LV Systolic Pressure 146 mmHg  LV Diastolic Pressure 10 mmHg  LV EDP 20 mmHg  AOp Systolic Pressure 102 mmHg  AOp Diastolic Pressure 58 mmHg  AOp Mean Pressure 77 mmHg  LVp Systolic Pressure 150 mmHg  LVp Diastolic Pressure 10 mmHg  LVp EDP Pressure 16 mmHg  QP/QS 1  TPVR Index 7.56 HRUI  TSVR Index 27.24 HRUI  PVR SVR Ratio 0.08  TPVR/TSVR Ratio 0.28    Cardiac TAVR CT  TECHNIQUE: The patient was scanned on a [employer]. A 120 kV retrospective scan was triggered in the  descending thoracic aorta at 111 HU's. Gantry rotation speed was  250 msecs and collimation was .6 mm. No beta blockade or nitro were given. The 3D data set was reconstructed in 5% intervals of the R-R cycle. Systolic and diastolic phases were analyzed on a dedicated work station using MPR, MIP and VRT modes. The patient received 80 cc of contrast.  FINDINGS: Aortic Valve: Trileaflet aortic valve with severely thickened and calcified leaflets with severely restricted leaflet opening and mild calcifications extending into the LVOT under the non-coronary leaflet.  Aorta: Normal size, mild diffuse calcifications, no dissection.  Sinotubular Junction: 28 x 28 mm  Ascending Thoracic Aorta: 34 x 34 mm  Aortic Arch: 29 x 27 mm  Descending Thoracic Aorta: 26 x 25 mm  Sinus of Valsalva Measurements:  Non-coronary: 33 mm  Right -coronary: 31 mm  Left -coronary: 33 mm  Coronary Artery Height above Annulus:  Left Main: 16 mm  Right Coronary: 17 mm  Virtual Basal Annulus Measurements:  Maximum/Minimum Diameter: 28.7 x 22.6 mm  Mean Diameter: 24.7 mm  Perimeter: 79.7 mm  Area: 480 mm2  Optimum Fluoroscopic Angle for Delivery: LAO 3 CAU 3  IMPRESSION: 1. Trileaflet aortic valve with severely thickened and calcified leaflets with severely restricted leaflet opening and mild calcifications extending into the LVOT under the non-coronary leaflet. Annular measurements suitable for delivery of a 26 mm Edwards - SAPIEN 3 valve.  2. Sufficient coronary to annulus distance.  3. Optimum Fluoroscopic Angle for Delivery:  LAO 3 CAU 3.  4. No thrombus in the left atrial appendage.   Electronically Signed   By: Tobias Alexander   On: 10/17/2018 17:43   CT ANGIOGRAPHY CHEST, ABDOMEN AND PELVIS  TECHNIQUE: Multidetector CT imaging through the chest, abdomen and pelvis was performed using the standard protocol during bolus administration of  intravenous contrast. Multiplanar reconstructed images and MIPs were obtained and reviewed to evaluate the vascular anatomy.  CONTRAST:  95mL OMNIPAQUE IOHEXOL 350 MG/ML SOLN  COMPARISON:  None.  FINDINGS: CTA CHEST FINDINGS  Cardiovascular: Heart size is borderline enlarged. There is no significant pericardial fluid, thickening or pericardial calcification. There is aortic atherosclerosis, as well as atherosclerosis of the great vessels of the mediastinum and the coronary arteries, including calcified atherosclerotic plaque in the left main, left anterior descending, left circumflex and right coronary arteries. Severe thickening calcification of the aortic valve. Mild calcifications of the mitral annulus.  Mediastinum/Lymph Nodes: No pathologically enlarged mediastinal or hilar lymph nodes. Several densely calcified right hilar lymph nodes are noted. Esophagus is unremarkable in appearance. No axillary lymphadenopathy.  Lungs/Pleura: No suspicious appearing pulmonary nodules or masses are noted. No acute consolidative airspace disease. No pleural effusions.  Musculoskeletal/Soft Tissues: There are no aggressive appearing lytic or blastic lesions noted in the visualized portions of the skeleton.  CTA ABDOMEN AND PELVIS FINDINGS  Hepatobiliary: No suspicious cystic or solid hepatic lesions. No intra or extrahepatic biliary ductal dilatation. Status post cholecystectomy.  Pancreas: No pancreatic mass. No pancreatic ductal dilatation. No pancreatic or peripancreatic fluid or inflammatory changes.  Spleen: Unremarkable.  Adrenals/Urinary Tract: Bilateral kidneys and bilateral adrenal glands are normal in appearance. No hydroureteronephrosis. Urinary bladder is normal in appearance.  Stomach/Bowel: Normal appearance of the stomach. No pathologic dilatation of small bowel or colon. Numerous colonic diverticulae are noted, particularly in the descending colon  and sigmoid colon, without surrounding inflammatory changes to suggest an acute diverticulitis at this time. The appendix is not confidently identified and may be surgically absent. Regardless, there are no inflammatory changes noted adjacent to the cecum  to suggest the presence of an acute appendicitis at this time.  Vascular/Lymphatic: Aortic atherosclerosis, with vascular findings and measurements pertinent to potential TAVR procedure, as detailed below. No aneurysm or dissection noted in the abdominal or pelvic vasculature. No lymphadenopathy noted in the abdomen or pelvis.  Reproductive: Prostate gland and seminal vesicles are unremarkable in appearance.  Other: No significant volume of ascites.  No pneumoperitoneum.  Musculoskeletal: There are no aggressive appearing lytic or blastic lesions noted in the visualized portions of the skeleton.  VASCULAR MEASUREMENTS PERTINENT TO TAVR:  AORTA:  Minimal Aortic Diameter-17 x 18 mm  Severity of Aortic Calcification-mild-to-moderate  RIGHT PELVIS:  Right Common Iliac Artery -  Minimal Diameter-10.3 x 10.3 mm  Tortuosity-mild  Calcification-mild  Right External Iliac Artery -  Minimal Diameter-8.8 x 9.4 mm  Tortuosity-severe  Calcification-none  Right Common Femoral Artery -  Minimal Diameter-9.9 x 8.8 mm  Tortuosity-mild  Calcification-mild  LEFT PELVIS:  Left Common Iliac Artery -  Minimal Diameter-10.8 x 10.5 mm  Tortuosity-mild  Calcification-mild  Left External Iliac Artery -  Minimal Diameter-9.2 x 9.3 mm  Tortuosity-severe  Calcification-none  Left Common Femoral Artery -  Minimal Diameter-9.3 x 8.6 mm  Tortuosity-mild  Calcification-none  Review of the MIP images confirms the above findings.  IMPRESSION: 1. Vascular findings and measurements pertinent to potential TAVR procedure, as detailed above. 2. Severe thickening calcification of the  aortic valve, compatible with the reported clinical history of severe aortic stenosis. 3. Aortic atherosclerosis, in addition to left main and 3 vessel coronary artery disease. Assessment for potential risk factor modification, dietary therapy or pharmacologic therapy may be warranted, if clinically indicated. 4. Severe colonic diverticulosis without findings to suggest an acute diverticulitis at this time. 5. Additional incidental findings, as above.   Electronically Signed   By: Trudie Reed M.D.   On: 10/17/2018 14:06   STS Risk Calculator  Procedure: Isolated AVR CALCULATE   Risk of Mortality:  0.616% Renal Failure:  0.376% Permanent Stroke:  0.689% Prolonged Ventilation:  3.370% DSW Infection:  0.086% Reoperation:  3.892% Morbidity or Mortality:  6.762% Short Length of Stay:  57.985% Long Length of Stay:  1.545%    Impression:  Patient has stage D severe symptomatic aortic stenosis.  He describes accelerating symptoms of exertional chest tightness and a recent episode of chest pain at rest consistent with angina pectoris.  He has had frequent dizzy spells with a near syncopal event but no frank syncope.  He reports progressive symptoms of exertional shortness of breath with moderate level activity, consistent with chronic diastolic congestive heart failure, New York Heart Association functional class II.  He denies any symptoms suggestive of ongoing respiratory infections including COVID-19 and he has not been traveling or been exposed to any other persons with known or suspected COVID-19 infection.  I have personally reviewed the patient's recent transthoracic echocardiogram, diagnostic cardiac catheterization, and CT angiograms.  Echocardiogram demonstrates severe aortic stenosis.  The aortic valve is trileaflet with severe thickening, calcification, and restricted leaflet mobility involving all 3 leaflets of the aortic valve.  Peak velocity across aortic  valve measured 4.4 m/s corresponding to mean transvalvular gradient estimated close to 50 mmHg.  Left ventricular systolic function remains normal.  Diagnostic cardiac catheterization revealed mild nonobstructive coronary artery disease and confirmed the presence of severe aortic stenosis.  Right heart pressures were normal.  There is no question the patient needs aortic valve replacement as soon as practical.  Risks associated with conventional surgery would  be relatively low.  Cardiac-gated CTA of the heart reveals anatomical characteristics consistent with aortic stenosis suitable for treatment by transcatheter aortic valve replacement without any significant complicating features and CTA of the aorta and iliac vessels demonstrate what appears to be adequate pelvic vascular access to facilitate a transfemoral approach.    Plan:  The patient and his wife were counseled at length regarding treatment alternatives for the management of severe symptomatic aortic stenosis. Alternative approaches such as conventional aortic valve replacement, transcatheter aortic valve replacement, and continued medical therapy without intervention were compared and contrasted at length.  The risks associated with conventional surgical aortic valve replacement were discussed in detail, as were alternative surgical approaches, prosthetic valve choices, expectations for the patient's post-operative convalescence, and the presence or absence of other concomitant conditions which might require intervention.  Issues specific to transcatheter aortic valve replacement were discussed including the typical convalescence following TAVR and how it differs from conventional surgery, questions about long term valve durability, the potential for paravalvular leak, possible increased risk of need for permanent pacemaker placement, the suitability of the patient's surgical anatomy, and other potential technical complications related to the  procedure itself.  The natural history of aortic stenosis and long-term prognosis without surgical intervention was discussed.  All treatment options were discussed in the context of the patient's own specific clinical presentation, past medical history, long-term prognosis and life expectancy.  All questions have been addressed.  We also discussed issues related to the ongoing COVID pandemic including the relatively low but potential risk of nosocomial transmission of disease.  This was contrasted with risks associated with continued delay in surgical intervention until COVID restrictions have been lifted.  The patient requests to proceed with transcatheter aortic valve replacement as soon as practical.  Following the decision to proceed with transcatheter aortic valve replacement, a discussion has been held regarding what types of management strategies would be attempted intraoperatively in the event of life-threatening complications, including whether or not the patient would be considered a candidate for the use of cardiopulmonary bypass and/or conversion to open sternotomy for attempted surgical intervention.  The patient has been advised of a variety of complications that might develop including but not limited to risks of death, stroke, paravalvular leak, aortic dissection or other major vascular complications, aortic annulus rupture, device embolization, cardiac rupture or perforation, mitral regurgitation, acute myocardial infarction, arrhythmia, heart block or bradycardia requiring permanent pacemaker placement, congestive heart failure, respiratory failure, renal failure, pneumonia, infection, other late complications related to structural valve deterioration or migration, or other complications that might ultimately cause a temporary or permanent loss of functional independence or other long term morbidity.  The patient provides full informed consent for the procedure as described and all questions  were answered.    I spent in excess of 90 minutes during the conduct of this office consultation and >50% of this time involved direct face-to-face encounter with the patient for counseling and/or coordination of their care.   Salvatore Decent. Cornelius Moras, MD 11/17/2018 2:03 PM

## 2018-11-17 NOTE — Progress Notes (Signed)
[facility]  MULTIDISCIPLINARY HEART VALVE CLINIC  CARDIOTHORACIC SURGERY CONSULTATION REPORT  Referring Provider is Dulce Sellar, Iline Oven, MD PCP is Philemon Kingdom, MD  Chief Complaint  Patient presents with  . Aortic Stenosis    Surgical eval for TAVR, review all studies    HPI:  Patient is a 78 year old male with history of aortic stenosis, hypertension, hypercholesterolemia, and hypothyroidism who has been referred for surgical consultation to discuss treatment options for management of severe symptomatic aortic stenosis.  Patient has remained physically active and relatively healthy all of his adult life.  Approximately 6 years ago he was noted to have a heart murmur on physical exam by his primary care physician.  He was diagnosed with aortic stenosis based on transthoracic echocardiogram and referred to Dr. Dulce Sellar who has been following him carefully for the past several years.  Follow-up echocardiograms have documented gradual progression of aortic stenosis with preserved left ventricular systolic function.  Most recent follow-up echocardiogram was performed September 27, 2018 and revealed severe aortic stenosis with peak velocity across aortic valve measured 4.4 m/s corresponding to mean transvalvular gradient estimated 49 mmHg and aortic valve area calculated 0.70 cm.  Left ventricular systolic function remain normal.  The patient was referred to the multidisciplinary heart valve clinic and underwent diagnostic cardiac catheterization by Dr. Clifton James on October 12, 2018.  Catheterization revealed mild nonobstructive coronary artery disease and confirmed the presence of severe aortic stenosis with peak to peak and mean transvalvular gradients measured 48 and 40.8 mmHg, respectively.  Right heart pressures were normal.    The patient is married and lives with his wife in [place].  He is retired from the as per police department.  He has remained physically active throughout  retirement.  Both he and his wife enjoy hiking and kayaking.  Beginning last summer the patient began to experience intermittent dizzy spells, exertional chest tightness, and exertional shortness of breath.  The patient was referred for surgical consultation several weeks ago but initially his consultation was postponed because of the ongoing COVID-19 pandemic.  However, symptoms have accelerated considerably over the past 6 weeks.  The patient reports frequent dizzy spells that can occur at anytime.  He has not had a syncopal episode.  He now gets chest tightness with moderate level activity.  He gets shortness of breath with moderate level activity.  Several days ago he was awoke from sleep with chest tightness that took 10 to 15 minutes to gradually resolve.  He has not had resting shortness of breath, PND, orthopnea, or lower extremity edema.  He telephoned Dr. Hulen Shouts office and a decision was made to accelerate his referral for definitive surgical intervention.  The patient denies any recent symptoms of fever, chills, or new cough.  He denies resting shortness of breath.  He and his wife have been practicing strict social distancing and have stayed at home by themselves for the last several weeks.  They have not traveled in several months.  They have not been around any other persons with known or suspected COVID-19 infection.  Past Medical History:  Diagnosis Date  . Closed left ankle fracture 2014  . Disc disease, degenerative, cervical   . Diverticulosis    mild left colonic  . Dyspnea    with exertion  . Heart murmur   . Hypercholesterolemia 08/10/2017  . Hypertension 08/10/2017  . Hypothyroidism 08/10/2017  . Low testosterone in male 08/10/2017  . Low vitamin B12 level 08/10/2017  . Mild aortic stenosis  08/10/2017  . Neck pain 08/10/2017  . 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 Vitamin D deficiency 08/10/2017  . Wears  glasses    readers    Past Surgical History:  Procedure Laterality Date  . COLONOSCOPY    . MENISCUS REPAIR Left   . RIGHT/LEFT HEART CATH AND CORONARY ANGIOGRAPHY N/A 10/12/2018   Procedure: RIGHT/LEFT HEART CATH AND CORONARY ANGIOGRAPHY;  Surgeon: Kathleene Hazel, MD;  Location: [facility];  Service: Cardiovascular;  Laterality: N/A;  . UMBILICAL HERNIA REPAIR      Family History  Problem Relation Age of Onset  . Leukemia Mother   . Heart disease Father   . Diabetes Maternal Grandmother   . Hypertension Maternal Grandmother   . CAD Paternal Grandfather     Social History   Socioeconomic History  . Marital status: Married    Spouse name: Not on file  . Number of children: Not on file  . Years of education: Not on file  . Highest education level: Not on file  Occupational History  . Not on file  Social Needs  . Financial resource strain: Not on file  . Food insecurity:    Worry: Not on file    Inability: Not on file  . Transportation needs:    Medical: Not on file    Non-medical: Not on file  Tobacco Use  . Smoking status: Former Smoker    Packs/day: 1.00    Years: 10.00    Pack years: 10.00    Types: Cigarettes    Last attempt to quit: 1969    Years since quitting: 51.3  . Smokeless tobacco: Never Used  Substance and Sexual Activity  . Alcohol use: Not Currently  . Drug use: No  . Sexual activity: Not on file  Lifestyle  . Physical activity:    Days per week: Not on file    Minutes per session: Not on file  . Stress: Not on file  Relationships  . Social connections:    Talks on phone: Not on file    Gets together: Not on file    Attends religious service: Not on file    Active member of club or organization: Not on file    Attends meetings of clubs or organizations: Not on file    Relationship status: Not on file  . Intimate partner violence:    Fear of current or ex partner: Not on file    Emotionally abused: Not on file     Physically abused: Not on file    Forced sexual activity: Not on file  Other Topics Concern  . Not on file  Social History Narrative  . Not on file    Current Outpatient Medications  Medication Sig Dispense Refill  . anastrozole (ARIMIDEX) 1 MG tablet Take 0.25 mg by mouth 2 (two) times a week. Pt states he is taking 1/4 of a tablet 2 times a week    . ARMOUR THYROID 30 MG tablet Take 30 mg by mouth daily before breakfast.   4  . cholecalciferol (VITAMIN D3) 25 MCG (1000 UT) tablet Take 1,000 Units by mouth daily.    . clindamycin (CLEOCIN) 300 MG capsule TAKE 2 CAPSULES BY MOUTH DAILY *take 30 minutes before appointment* (Patient taking differently: Take 600 mg by mouth See admin instructions. FOR DENTAL PROCEDURES) 10 capsule 2  . Glucosamine-MSM-Hyaluronic Acd (JOINT HEALTH PO) Take  1 capsule by mouth daily.    . Melatonin 1 MG TABS Take 0.5 tablets by mouth at bedtime.     . Menaquinone-7 (VITAMIN K2 PO) Take 1 tablet by mouth daily.    . Multiple Vitamins-Minerals (MULTIVITAMIN WITH MINERALS) tablet Take 1 tablet by mouth daily.    . sildenafil (REVATIO) 20 MG tablet Take 20 mg by mouth daily as needed (ED).     Marland Kitchen telmisartan (MICARDIS) 40 MG tablet Take 40 mg by mouth at bedtime.     . Testosterone 20 % CREA Apply 1 application topically daily.     . vitamin C (ASCORBIC ACID) 500 MG tablet Take 500 mg by mouth daily.     No current facility-administered medications for this visit.     Allergies  Allergen Reactions  . Penicillins Rash    Did it involve swelling of the face/tongue/throat, SOB, or low BP? No Did it involve sudden or severe rash/hives, skin peeling, or any reaction on the inside of your mouth or nose? Yes Did you need to seek medical attention at a hospital or doctor's office? Yes When did it last happen?Long time ago  If all above answers are "NO", may proceed with cephalosporin use.       Review of Systems:   General:  normal appetite, normal  energy, no weight gain, no weight loss, no fever  Cardiac:  + chest pain with exertion, one episode chest pain at rest, +SOB with exertion, no resting SOB, no PND, no orthopnea, no palpitations, no arrhythmia, no atrial fibrillation, no LE edema, + dizzy spells, no syncope  Respiratory:  + exertional shortness of breath, no home oxygen, no productive cough, no dry cough, no bronchitis, no wheezing, no hemoptysis, no asthma, no pain with inspiration or cough, no sleep apnea, no CPAP at night  GI:   no difficulty swallowing, no reflux, no frequent heartburn, no hiatal hernia, no abdominal pain, no constipation, no diarrhea, no hematochezia, no hematemesis, no melena  GU:   no dysuria,  + frequency, no urinary tract infection, no hematuria, + enlarged prostate, no kidney stones, no kidney disease  Vascular:  no pain suggestive of claudication, no pain in feet, no leg cramps, no varicose veins, no DVT, no non-healing foot ulcer  Neuro:   no stroke, no TIA's, no seizures, no headaches, no temporary blindness one eye,  no slurred speech, no peripheral neuropathy, no chronic pain, no instability of gait, no memory/cognitive dysfunction  Musculoskeletal: + arthritis in neck, no joint swelling, no myalgias, no difficulty walking, normal mobility   Skin:   no rash, no itching, no skin infections, no pressure sores or ulcerations  Psych:   no anxiety, no depression, no nervousness, no unusual recent stress  Eyes:   no blurry vision, no floaters, no recent vision changes, + wears glasses for reading  ENT:   no hearing loss, no loose or painful teeth, no dentures, last saw dentist November 2019  Hematologic:  no easy bruising, no abnormal bleeding, no clotting disorder, no frequent epistaxis  Endocrine:  no diabetes, does not check CBG's at home           Physical Exam:   BP 124/74   Pulse 68   Temp (!) 97.1 F (36.2 C) (Oral)   Resp 20   Ht 6' (1.829 m)   Wt 218 lb (98.9 kg)   SpO2 98% Comment: RA   BMI 29.57 kg/m   General:    well-appearing  HEENT:  Unremarkable   Neck:   no JVD, no bruits, no adenopathy   Chest:   clear to auscultation, symmetrical breath sounds, no wheezes, no rhonchi   CV:   RRR, grade III/VI crescendo/decrescendo murmur heard best at RSB,  no diastolic murmur  Abdomen:  soft, non-tender, no masses   Extremities:  warm, well-perfused, pulses palpable, no LE edema  Rectal/GU  Deferred  Neuro:   Grossly non-focal and symmetrical throughout  Skin:   Clean and dry, no rashes, no breakdown   Diagnostic Tests:  EKG: NSR w/out significant AV conduction delay    ECHOCARDIOGRAM REPORT    Patient Name:   Aaron Foley Date of Exam: 09/27/2018 Medical Rec #:  [id number]          Height:       71.0 in Accession #:    [id number]         Weight:       223.4 lb Date of Birth:  1940/10/24           BSA:          2.21 m Patient Age:    77 years           BP:           134/84 mmHg Patient Gender: M                  HR:           64 bpm. Exam Location:  [facility]    Procedure: 2D Echo  Indications:    Aortic Stenosis 424.1 / 135.0   History:        Patient has prior history of Echocardiogram examinations, most                 recent 07/21/2017. Signs/Symptoms: Chest Pain; Risk Factors:                 Hypertension and Dyslipidemia.   Sonographer:    Louie Boston Referring Phys: [phone] BRIAN J MUNLEY  IMPRESSIONS    1. The left ventricle has normal systolic function, with an ejection fraction of 55-60%. The cavity size was normal. There is moderate concentric left ventricular hypertrophy. Left ventricular diastolic Doppler parameters are consistent with impaired  relaxation Elevated left ventricular end-diastolic pressure No evidence of left ventricular regional wall motion abnormalities.  2. The right ventricle has normal systolic function. The cavity was normal. There is no increase in right ventricular wall thickness.  3. Left atrial size was mildly  dilated.  4. The mitral valve is degenerative. There is mild to moderate mitral annular calcification present.  5. The aortic valve is tricuspid Severely thickening of the aortic valve Mild calcification of the aortic valve. Aortic valve regurgitation is mild by color flow Doppler. severe stenosis of the aortic valve.  6. The aortic root, ascending aorta and aortic arch are normal in size and structure.  7. The inferior vena cava was dilated in size with >50% respiratory variability.  8. No evidence of left ventricular regional wall motion abnormalities.  FINDINGS  Left Ventricle: The left ventricle has normal systolic function, with an ejection fraction of 55-60%. The cavity size was normal. There is moderate concentric left ventricular hypertrophy. Left ventricular diastolic Doppler parameters are consistent  with impaired relaxation Elevated left ventricular end-diastolic pressure No evidence of left ventricular regional wall motion abnormalities.. Right Ventricle: The right ventricle has normal systolic function. The cavity was normal. There is no increase in right ventricular  wall thickness. Left Atrium: left atrial size was mildly dilated Right Atrium: right atrial size was normal in size. Right atrial pressure is estimated at 8 mmHg. Interatrial Septum: No atrial level shunt detected by color flow Doppler. Pericardium: There is no evidence of pericardial effusion. Mitral Valve: The mitral valve is degenerative in appearance. There is mild to moderate mitral annular calcification present. Mitral valve regurgitation is not visualized by color flow Doppler. Tricuspid Valve: The tricuspid valve is normal in structure. Tricuspid valve regurgitation was not visualized by color flow Doppler. Aortic Valve: The aortic valve is tricuspid Severely thickening of the aortic valve Mild calcification of the aortic valve, with severely decreased cusp excursion. Aortic valve regurgitation is mild by color  flow Doppler. There is severe stenosis of the  aortic valve, with a calculated valve area of 0.70 cm. Pulmonic Valve: The pulmonic valve was normal in structure. Pulmonic valve regurgitation is not visualized by color flow Doppler. No evidence of pulmonic stenosis. Aorta: The aortic root, ascending aorta and aortic arch are normal in size and structure. Pulmonary Artery: The pulmonary artery is of normal size and origin. Venous: The inferior vena cava measures 2.10 cm, is dilated in size with greater than 50% respiratory variability.   LEFT VENTRICLE PLAX 2D (Teich) LV EF:          68.0 %   Diastology LVIDd:          4.50 cm  LV e' lateral:   5.66 cm/s LVIDs:          2.80 cm  LV E/e' lateral: 19.6 LV PW:          1.40 cm  LV e' medial:    6.42 cm/s LV IVS:         1.40 cm  LV E/e' medial:  17.3 LVOT diam:      2.00 cm LV SV:          63 ml LVOT Area:      3.14 cm  RIGHT VENTRICLE RV S prime:     11.00 cm/s TAPSE (M-mode): 3.5 cm  LEFT ATRIUM             Index       RIGHT ATRIUM           Index LA diam:        4.70 cm 2.13 cm/m  RA Pressure: 8 mmHg LA Vol (A2C):   88.7 ml 40.14 ml/m RA Area:     21.20 cm LA Vol (A4C):   59.4 ml 26.88 ml/m RA Volume:   70.40 ml  31.86 ml/m LA Biplane Vol: 77.8 ml 35.20 ml/m  AORTIC VALVE AV Area (Vmax):    0.78 cm AV Area (Vmean):   0.65 cm AV Area (VTI):     0.70 cm AV Vmax:           441.00 cm/s AV Vmean:          330.000 cm/s AV VTI:            1.170 m AV Peak Grad:      77.8 mmHg AV Mean Grad:      49.3 mmHg LVOT Vmax:         109.00 cm/s LVOT Vmean:        68.800 cm/s LVOT VTI:          0.261 m LVOT/AV VTI ratio: 0.22   AORTA Ao Root diam: 2.90 cm Ao Asc diam:  3.50 cm Ao Desc diam:  2.30 cm  MITRAL VALVE MV Area (PHT): 2.05 cm MV PHT:        107.30 msec MV Decel Time: 370 msec MV E velocity: 111.00 cm/s MV A velocity: 130.00 cm/s MV E/A ratio:  0.85  IVC IVC diam: 2.10 cm    Norman Herrlich MD  Electronically signed by Norman Herrlich MD Signature Date/Time: 09/27/2018/1:02:47 PM    RIGHT/LEFT HEART CATH AND CORONARY ANGIOGRAPHY  Conclusion     Prox RCA lesion is 20% stenosed.  Post Atrio lesion is 20% stenosed.  Mid Cx to Dist Cx lesion is 30% stenosed.  Ost 1st Mrg to 1st Mrg lesion is 40% stenosed.  Prox LAD to Mid LAD lesion is 20% stenosed.   1. Mild non-obstructive CAD 2. Severe aortic stenosis (mean gradient 40.8 mmHg, peak gradient 48 mmHg)  Recommendation: Will start workup for TAVR   Recommendations   Antiplatelet/Anticoag Proceed with TAVR workup.  Discharge Date In the absence of any other complications or medical issues, we expect the patient to be ready for discharge on 10/12/2018.  Indications   Severe aortic stenosis [I35.0 (ICD-10-CM)]  Procedural Details   Technical Details Indication: 78 yo male with severe AS  Procedure: The risks, benefits, complications, treatment options, and expected outcomes were discussed with the patient. The patient and/or family concurred with the proposed plan, giving informed consent. The patient was brought to the cath lab after IV hydration was given. The patient was further sedated with Versed and Fentanyl.  The IV catheter in the right antecubital vein was prepped and draped. This was changed for a 5 [place] sheath. Right heart catheterization performed with a balloon tipped catheter. The right wrist was prepped and draped in a sterile fashion. 1% lidocaine was used for local anesthesia. Using the modified Seldinger access technique, a 5 French sheath was placed in the right radial artery. 3 mg Verapamil was given through the sheath. 5000 units IV heparin was given. Standard diagnostic catheters were used to perform selective coronary angiography. I crossed the aortic valve with an AL-1 catheter and a straight wire. The sheath was removed from the right radial artery and a Terumo hemostasis band was applied at the  arteriotomy site on the right wrist.    Estimated blood loss <50 mL.   During this procedure medications were administered to achieve and maintain moderate conscious sedation while the patient's heart rate, blood pressure, and oxygen saturation were continuously monitored and I was present face-to-face 100% of this time.  Medications  (Filter: Administrations occurring from 10/12/18 0715 to 10/12/18 0845)  Medication Rate/Dose/Volume Action  Date Time   Heparin (Porcine) in NaCl 1000-0.9 UT/500ML-% SOLN (mL) 500 mL Given 10/12/18 0724   Total dose as of 11/17/18 1405        500 mL        Heparin (Porcine) in NaCl 1000-0.9 UT/500ML-% SOLN (mL) 500 mL Given 10/12/18 0724   Total dose as of 11/17/18 1405        500 mL        lidocaine (PF) (XYLOCAINE) 1 % injection (mL) 2 mL Given 10/12/18 0729   Total dose as of 11/17/18 1405 2 mL Given 0732   4 mL        midazolam (VERSED) injection (mg) 1 mg Given 10/12/18 0746   Total dose as of 11/17/18 1405        1 mg        fentaNYL (SUBLIMAZE) injection (mcg) 25 mcg Given 10/12/18 0747  Total dose as of 11/17/18 1405        25 mcg        heparin injection (Units) 5,000 Units Given 10/12/18 0753   Total dose as of 11/17/18 1405        5,000 Units        0.9 % sodium chloride infusion (mL/hr) 75 mL/hr Rate/Dose Change 10/12/18 0830   Dosing weight:  99.8 kg        Total dose as of 11/17/18 1405        280 mL        Sedation Time   Sedation Time Physician-1: 48 minutes 18 seconds  Complications   Complications documented before study signed (10/12/2018 12:46 PM EDT)    RIGHT/LEFT HEART CATH AND CORONARY ANGIOGRAPHY   None Documented by Kathleene Hazel, MD 10/12/2018 8:59 AM EDT  Time Range: Intraprocedure     None Documented by Kathleene Hazel, MD 10/12/2018 10:46 AM EDT  Time Range: Intraprocedure      Coronary Findings   Diagnostic  Dominance: Right  Left Anterior Descending  Prox LAD to Mid LAD lesion 20%  stenosed  Prox LAD to Mid LAD lesion is 20% stenosed.  Left Circumflex  Mid Cx to Dist Cx lesion 30% stenosed  Mid Cx to Dist Cx lesion is 30% stenosed.  First Obtuse Marginal Branch  Ost 1st Mrg to 1st Mrg lesion 40% stenosed  Ost 1st Mrg to 1st Mrg lesion is 40% stenosed.  Right Coronary Artery  Prox RCA lesion 20% stenosed  Prox RCA lesion is 20% stenosed.  Right Posterior Atrioventricular Branch  Post Atrio lesion 20% stenosed  Post Atrio lesion is 20% stenosed.  Intervention   No interventions have been documented.  Coronary Diagrams   Diagnostic  Dominance: Right    Intervention   Implants    No implant documentation for this case.  Syngo Images   Show images for CARDIAC CATHETERIZATION  MERGE Images   Show images for CARDIAC CATHETERIZATION   Link to Procedure Log   Procedure Log    Hemo Data    Most Recent Value  Fick Cardiac Output 5.75 L/min  Fick Cardiac Output Index 2.64 (L/min)/BSA  Aortic Mean Gradient 40.77 mmHg  Aortic Peak Gradient 48 mmHg  Aortic Valve Area 1.18  Aortic Value Area Index 0.54 cm2/BSA  RA A Wave 14 mmHg  RA V Wave 10 mmHg  RA Mean 9 mmHg  RV Systolic Pressure 33 mmHg  RV Diastolic Pressure 5 mmHg  RV EDP 11 mmHg  PA Systolic Pressure 32 mmHg  PA Diastolic Pressure 15 mmHg  PA Mean 20 mmHg  PW A Wave 22 mmHg  PW V Wave 19 mmHg  PW Mean 15 mmHg  AO Systolic Pressure 92 mmHg  AO Diastolic Pressure 54 mmHg  AO Mean 72 mmHg  LV Systolic Pressure 146 mmHg  LV Diastolic Pressure 10 mmHg  LV EDP 20 mmHg  AOp Systolic Pressure 102 mmHg  AOp Diastolic Pressure 58 mmHg  AOp Mean Pressure 77 mmHg  LVp Systolic Pressure 150 mmHg  LVp Diastolic Pressure 10 mmHg  LVp EDP Pressure 16 mmHg  QP/QS 1  TPVR Index 7.56 HRUI  TSVR Index 27.24 HRUI  PVR SVR Ratio 0.08  TPVR/TSVR Ratio 0.28    Cardiac TAVR CT  TECHNIQUE: The patient was scanned on a [employer]. A 120 kV retrospective scan was triggered in the  descending thoracic aorta at 111 HU's. Gantry rotation speed was  250 msecs and collimation was .6 mm. No beta blockade or nitro were given. The 3D data set was reconstructed in 5% intervals of the R-R cycle. Systolic and diastolic phases were analyzed on a dedicated work station using MPR, MIP and VRT modes. The patient received 80 cc of contrast.  FINDINGS: Aortic Valve: Trileaflet aortic valve with severely thickened and calcified leaflets with severely restricted leaflet opening and mild calcifications extending into the LVOT under the non-coronary leaflet.  Aorta: Normal size, mild diffuse calcifications, no dissection.  Sinotubular Junction: 28 x 28 mm  Ascending Thoracic Aorta: 34 x 34 mm  Aortic Arch: 29 x 27 mm  Descending Thoracic Aorta: 26 x 25 mm  Sinus of Valsalva Measurements:  Non-coronary: 33 mm  Right -coronary: 31 mm  Left -coronary: 33 mm  Coronary Artery Height above Annulus:  Left Main: 16 mm  Right Coronary: 17 mm  Virtual Basal Annulus Measurements:  Maximum/Minimum Diameter: 28.7 x 22.6 mm  Mean Diameter: 24.7 mm  Perimeter: 79.7 mm  Area: 480 mm2  Optimum Fluoroscopic Angle for Delivery: LAO 3 CAU 3  IMPRESSION: 1. Trileaflet aortic valve with severely thickened and calcified leaflets with severely restricted leaflet opening and mild calcifications extending into the LVOT under the non-coronary leaflet. Annular measurements suitable for delivery of a 26 mm Edwards - SAPIEN 3 valve.  2. Sufficient coronary to annulus distance.  3. Optimum Fluoroscopic Angle for Delivery:  LAO 3 CAU 3.  4. No thrombus in the left atrial appendage.   Electronically Signed   By: Tobias Alexander   On: 10/17/2018 17:43   CT ANGIOGRAPHY CHEST, ABDOMEN AND PELVIS  TECHNIQUE: Multidetector CT imaging through the chest, abdomen and pelvis was performed using the standard protocol during bolus administration of  intravenous contrast. Multiplanar reconstructed images and MIPs were obtained and reviewed to evaluate the vascular anatomy.  CONTRAST:  95mL OMNIPAQUE IOHEXOL 350 MG/ML SOLN  COMPARISON:  None.  FINDINGS: CTA CHEST FINDINGS  Cardiovascular: Heart size is borderline enlarged. There is no significant pericardial fluid, thickening or pericardial calcification. There is aortic atherosclerosis, as well as atherosclerosis of the great vessels of the mediastinum and the coronary arteries, including calcified atherosclerotic plaque in the left main, left anterior descending, left circumflex and right coronary arteries. Severe thickening calcification of the aortic valve. Mild calcifications of the mitral annulus.  Mediastinum/Lymph Nodes: No pathologically enlarged mediastinal or hilar lymph nodes. Several densely calcified right hilar lymph nodes are noted. Esophagus is unremarkable in appearance. No axillary lymphadenopathy.  Lungs/Pleura: No suspicious appearing pulmonary nodules or masses are noted. No acute consolidative airspace disease. No pleural effusions.  Musculoskeletal/Soft Tissues: There are no aggressive appearing lytic or blastic lesions noted in the visualized portions of the skeleton.  CTA ABDOMEN AND PELVIS FINDINGS  Hepatobiliary: No suspicious cystic or solid hepatic lesions. No intra or extrahepatic biliary ductal dilatation. Status post cholecystectomy.  Pancreas: No pancreatic mass. No pancreatic ductal dilatation. No pancreatic or peripancreatic fluid or inflammatory changes.  Spleen: Unremarkable.  Adrenals/Urinary Tract: Bilateral kidneys and bilateral adrenal glands are normal in appearance. No hydroureteronephrosis. Urinary bladder is normal in appearance.  Stomach/Bowel: Normal appearance of the stomach. No pathologic dilatation of small bowel or colon. Numerous colonic diverticulae are noted, particularly in the descending colon  and sigmoid colon, without surrounding inflammatory changes to suggest an acute diverticulitis at this time. The appendix is not confidently identified and may be surgically absent. Regardless, there are no inflammatory changes noted adjacent to the cecum  to suggest the presence of an acute appendicitis at this time.  Vascular/Lymphatic: Aortic atherosclerosis, with vascular findings and measurements pertinent to potential TAVR procedure, as detailed below. No aneurysm or dissection noted in the abdominal or pelvic vasculature. No lymphadenopathy noted in the abdomen or pelvis.  Reproductive: Prostate gland and seminal vesicles are unremarkable in appearance.  Other: No significant volume of ascites.  No pneumoperitoneum.  Musculoskeletal: There are no aggressive appearing lytic or blastic lesions noted in the visualized portions of the skeleton.  VASCULAR MEASUREMENTS PERTINENT TO TAVR:  AORTA:  Minimal Aortic Diameter-17 x 18 mm  Severity of Aortic Calcification-mild-to-moderate  RIGHT PELVIS:  Right Common Iliac Artery -  Minimal Diameter-10.3 x 10.3 mm  Tortuosity-mild  Calcification-mild  Right External Iliac Artery -  Minimal Diameter-8.8 x 9.4 mm  Tortuosity-severe  Calcification-none  Right Common Femoral Artery -  Minimal Diameter-9.9 x 8.8 mm  Tortuosity-mild  Calcification-mild  LEFT PELVIS:  Left Common Iliac Artery -  Minimal Diameter-10.8 x 10.5 mm  Tortuosity-mild  Calcification-mild  Left External Iliac Artery -  Minimal Diameter-9.2 x 9.3 mm  Tortuosity-severe  Calcification-none  Left Common Femoral Artery -  Minimal Diameter-9.3 x 8.6 mm  Tortuosity-mild  Calcification-none  Review of the MIP images confirms the above findings.  IMPRESSION: 1. Vascular findings and measurements pertinent to potential TAVR procedure, as detailed above. 2. Severe thickening calcification of the  aortic valve, compatible with the reported clinical history of severe aortic stenosis. 3. Aortic atherosclerosis, in addition to left main and 3 vessel coronary artery disease. Assessment for potential risk factor modification, dietary therapy or pharmacologic therapy may be warranted, if clinically indicated. 4. Severe colonic diverticulosis without findings to suggest an acute diverticulitis at this time. 5. Additional incidental findings, as above.   Electronically Signed   By: Trudie Reed M.D.   On: 10/17/2018 14:06   STS Risk Calculator  Procedure: Isolated AVR CALCULATE   Risk of Mortality:  0.616% Renal Failure:  0.376% Permanent Stroke:  0.689% Prolonged Ventilation:  3.370% DSW Infection:  0.086% Reoperation:  3.892% Morbidity or Mortality:  6.762% Short Length of Stay:  57.985% Long Length of Stay:  1.545%    Impression:  Patient has stage D severe symptomatic aortic stenosis.  He describes accelerating symptoms of exertional chest tightness and a recent episode of chest pain at rest consistent with angina pectoris.  He has had frequent dizzy spells with a near syncopal event but no frank syncope.  He reports progressive symptoms of exertional shortness of breath with moderate level activity, consistent with chronic diastolic congestive heart failure, New York Heart Association functional class II.  He denies any symptoms suggestive of ongoing respiratory infections including COVID-19 and he has not been traveling or been exposed to any other persons with known or suspected COVID-19 infection.  I have personally reviewed the patient's recent transthoracic echocardiogram, diagnostic cardiac catheterization, and CT angiograms.  Echocardiogram demonstrates severe aortic stenosis.  The aortic valve is trileaflet with severe thickening, calcification, and restricted leaflet mobility involving all 3 leaflets of the aortic valve.  Peak velocity across aortic  valve measured 4.4 m/s corresponding to mean transvalvular gradient estimated close to 50 mmHg.  Left ventricular systolic function remains normal.  Diagnostic cardiac catheterization revealed mild nonobstructive coronary artery disease and confirmed the presence of severe aortic stenosis.  Right heart pressures were normal.  There is no question the patient needs aortic valve replacement as soon as practical.  Risks associated with conventional surgery would  be relatively low.  Cardiac-gated CTA of the heart reveals anatomical characteristics consistent with aortic stenosis suitable for treatment by transcatheter aortic valve replacement without any significant complicating features and CTA of the aorta and iliac vessels demonstrate what appears to be adequate pelvic vascular access to facilitate a transfemoral approach.    Plan:  The patient and his wife were counseled at length regarding treatment alternatives for the management of severe symptomatic aortic stenosis. Alternative approaches such as conventional aortic valve replacement, transcatheter aortic valve replacement, and continued medical therapy without intervention were compared and contrasted at length.  The risks associated with conventional surgical aortic valve replacement were discussed in detail, as were alternative surgical approaches, prosthetic valve choices, expectations for the patient's post-operative convalescence, and the presence or absence of other concomitant conditions which might require intervention.  Issues specific to transcatheter aortic valve replacement were discussed including the typical convalescence following TAVR and how it differs from conventional surgery, questions about long term valve durability, the potential for paravalvular leak, possible increased risk of need for permanent pacemaker placement, the suitability of the patient's surgical anatomy, and other potential technical complications related to the  procedure itself.  The natural history of aortic stenosis and long-term prognosis without surgical intervention was discussed.  All treatment options were discussed in the context of the patient's own specific clinical presentation, past medical history, long-term prognosis and life expectancy.  All questions have been addressed.  We also discussed issues related to the ongoing COVID pandemic including the relatively low but potential risk of nosocomial transmission of disease.  This was contrasted with risks associated with continued delay in surgical intervention until COVID restrictions have been lifted.  The patient requests to proceed with transcatheter aortic valve replacement as soon as practical.  Following the decision to proceed with transcatheter aortic valve replacement, a discussion has been held regarding what types of management strategies would be attempted intraoperatively in the event of life-threatening complications, including whether or not the patient would be considered a candidate for the use of cardiopulmonary bypass and/or conversion to open sternotomy for attempted surgical intervention.  The patient has been advised of a variety of complications that might develop including but not limited to risks of death, stroke, paravalvular leak, aortic dissection or other major vascular complications, aortic annulus rupture, device embolization, cardiac rupture or perforation, mitral regurgitation, acute myocardial infarction, arrhythmia, heart block or bradycardia requiring permanent pacemaker placement, congestive heart failure, respiratory failure, renal failure, pneumonia, infection, other late complications related to structural valve deterioration or migration, or other complications that might ultimately cause a temporary or permanent loss of functional independence or other long term morbidity.  The patient provides full informed consent for the procedure as described and all questions  were answered.    I spent in excess of 90 minutes during the conduct of this office consultation and >50% of this time involved direct face-to-face encounter with the patient for counseling and/or coordination of their care.   Salvatore Decent. Cornelius Moras, MD 11/17/2018 2:03 PM

## 2018-11-17 NOTE — Therapy (Signed)
[place], [place], [address] Phone: [phone]   Fax:  [phone]  Physical Therapy Evaluation  Patient Details  Name: Aaron Foley MRN: [id number] Date of Birth: 1940/11/20 Referring Provider (PT): Dr Lauree Chandler    Encounter Date: 11/17/2018  PT End of Session - 11/17/18 0929    Visit Number  1    Number of Visits  1    Date for PT Re-Evaluation  11/17/18    Authorization Type  [employer]     PT Start Time  0845    PT Stop Time  0915    PT Time Calculation (min)  30 min    Activity Tolerance  Patient tolerated treatment well    Behavior During Therapy  [facility] for tasks assessed/performed       Past Medical History:  Diagnosis Date  . Closed left ankle fracture 2014  . Disc disease, degenerative, cervical   . Diverticulosis    mild left colonic  . Hypercholesterolemia 08/10/2017  . Hypertension 08/10/2017  . Hypothyroidism 08/10/2017  . Low testosterone in male 08/10/2017  . Low vitamin B12 level 08/10/2017  . Mild aortic stenosis 08/10/2017  . Neck pain 08/10/2017  . 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 Skin cancer   . Vitamin D deficiency 08/10/2017    Past Surgical History:  Procedure Laterality Date  . MENISCUS REPAIR Left   . RIGHT/LEFT HEART CATH AND CORONARY ANGIOGRAPHY N/A 10/12/2018   Procedure: RIGHT/LEFT HEART CATH AND CORONARY ANGIOGRAPHY;  Surgeon: Burnell Blanks, MD;  Location: [facility];  Service: Cardiovascular;  Laterality: N/A;  . UMBILICAL HERNIA REPAIR      There were no vitals filed for this visit.   Subjective Assessment - 11/17/18 0842    Subjective  Patient has noticed shortness of breath over the past 6 months. He has been noticing some lightheadness over the past 6 months. He walks a few miles a day.     Currently in Pain?  No/denies         [facility] PT Assessment - 11/17/18 0001      Assessment   Medical Diagnosis   Severe Aortic Stenosis    Referring Provider (PT)  Dr Lauree Chandler     Onset Date/Surgical Date  --   26 months   Prior Therapy  none       Precautions   Precautions  None      Restrictions   Weight Bearing Restrictions  No      Balance Screen   Has the patient fallen in the past 6 months  No    Has the patient had a decrease in activity level because of a fear of falling?   No    Is the patient reluctant to leave their home because of a fear of falling?   No      Home Environment   Additional Comments  No steps       Prior Function   Level of Independence  Independent    Vocation  Retired    Leisure  likes to walk       Cognition   Overall Cognitive Status  Within Functional Limits for tasks assessed    Attention  Focused    Focused Attention  Appears intact    Memory  Appears intact    Awareness  Appears intact    Problem Solving  Appears intact      Sensation  and O2 was 84% ( after 1 min of rest increased to 94%) on room air. Pt reported 2/10 shortness of breath on modified scale for dyspnea. Blood pressure increased significantly with 6 minute walk test. Based on the Short Physical Performance Battery, patient has a frailty rating of 12/12 with </= 5/12 considered frail.     Patient will benefit from skilled therapeutic intervention in order to improve the following deficits and impairments:     Visit Diagnosis: Other abnormalities of gait  and mobility     Problem List Patient Active Problem List   Diagnosis Date Noted  . Severe aortic stenosis   . Essential hypertension 08/10/2017  . Hypothyroidism 08/10/2017  . Hypercholesterolemia 08/10/2017  . Neck pain 08/10/2017  . Aortic stenosis 08/10/2017  . Penis disorder 08/10/2017  . Vitamin D deficiency 08/10/2017  . Low testosterone in male 08/10/2017  . Low vitamin B12 level 08/10/2017    Carney Living  PT DPT  11/17/2018, 9:35 AM  [facility] [address] [place], [place], [address] Phone: [phone]   Fax:  [phone]  Name: Aaron Foley MRN: [id number] Date of Birth: Jun 22, 1941  [place], [place], [address] Phone: [phone]   Fax:  [phone]  Physical Therapy Evaluation  Patient Details  Name: Aaron Foley MRN: [id number] Date of Birth: 1940/11/20 Referring Provider (PT): Dr Lauree Chandler    Encounter Date: 11/17/2018  PT End of Session - 11/17/18 0929    Visit Number  1    Number of Visits  1    Date for PT Re-Evaluation  11/17/18    Authorization Type  [employer]etna     PT Start Time  0845    PT Stop Time  0915    PT Time Calculation (min)  30 min    Activity Tolerance  Patient tolerated treatment well    Behavior During Therapy  [facility] for tasks assessed/performed       Past Medical History:  Diagnosis Date  . Closed left ankle fracture 2014  . Disc disease, degenerative, cervical   . Diverticulosis    mild left colonic  . Hypercholesterolemia 08/10/2017  . Hypertension 08/10/2017  . Hypothyroidism 08/10/2017  . Low testosterone in male 08/10/2017  . Low vitamin B12 level 08/10/2017  . Mild aortic stenosis 08/10/2017  . Neck pain 08/10/2017  . 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 Skin cancer   . Vitamin D deficiency 08/10/2017    Past Surgical History:  Procedure Laterality Date  . MENISCUS REPAIR Left   . RIGHT/LEFT HEART CATH AND CORONARY ANGIOGRAPHY N/A 10/12/2018   Procedure: RIGHT/LEFT HEART CATH AND CORONARY ANGIOGRAPHY;  Surgeon: Burnell Blanks, MD;  Location: [facility];  Service: Cardiovascular;  Laterality: N/A;  . UMBILICAL HERNIA REPAIR      There were no vitals filed for this visit.   Subjective Assessment - 11/17/18 0842    Subjective  Patient has noticed shortness of breath over the past 6 months. He has been noticing some lightheadness over the past 6 months. He walks a few miles a day.     Currently in Pain?  No/denies         [facility] PT Assessment - 11/17/18 0001      Assessment   Medical Diagnosis   Severe Aortic Stenosis    Referring Provider (PT)  Dr Lauree Chandler     Onset Date/Surgical Date  --   26 months   Prior Therapy  none       Precautions   Precautions  None      Restrictions   Weight Bearing Restrictions  No      Balance Screen   Has the patient fallen in the past 6 months  No    Has the patient had a decrease in activity level because of a fear of falling?   No    Is the patient reluctant to leave their home because of a fear of falling?   No      Home Environment   Additional Comments  No steps       Prior Function   Level of Independence  Independent    Vocation  Retired    Leisure  likes to walk       Cognition   Overall Cognitive Status  Within Functional Limits for tasks assessed    Attention  Focused    Focused Attention  Appears intact    Memory  Appears intact    Awareness  Appears intact    Problem Solving  Appears intact      Sensation

## 2018-11-17 NOTE — Progress Notes (Addendum)
[place], [place] [address] Phone: [phone] Fax: [phone]      Your procedure is scheduled on Tuesday, 11/21/2018.  Report to [facility] Main Entrance "A" Then check in at Admitting at [place].M.  Call this number if you have problems the morning of surgery:  [phone]  Call [phone] if you have any questions prior to your surgery date Monday-Friday 8am-4pm    Remember:  Do not eat or drink after midnight Monday 11/20/18   Take these medicines the morning of surgery with A SIP OF WATER: NONE    7 days prior to surgery STOP taking any Aspirin (unless otherwise instructed by your surgeon), Aleve, Naproxen, Ibuprofen, Motrin, Advil, Goody's, BC's, all herbal medications, fish oil, and all vitamins.    The Morning of Surgery  Do not wear jewelry, make-up or nail polish.  Do not wear lotions, powders, or colognes, or deodorant  Men may shave face and neck.  Do not bring valuables to the hospital.  [facility] is not responsible for any belongings or valuables.  If you are a smoker, DO NOT Smoke 24 hours prior to surgery  REMEMBER THAT YOU MUST SOMEONE TO TRANSPORT YOU HOME AFTER SURGERY IF YOU ARE DISCHARGED THE SAME DAY OF SURGERY  YOU WILL ALSO NEED SOMEONE TO STAY WITH YOU FOR 24 HOURS AFTER SURGERY IF YOU ARE DISCHARGED THE SAME DAY OF SURGERY   Contacts, glasses, hearing aids, dentures or bridgework may not be worn into surgery.   If you wear a CPAP at night please bring your mask, tubing, and machine the morning of surgery   Leave your suitcase in the car.  After surgery it may be brought to your room.  For patients admitted to the hospital, discharge time will be determined by your treatment team.  Patients discharged the day of surgery will not be allowed to drive home.    Special instructions:   [place]- Preparing For Surgery  Before surgery, you can play an important role. Because skin is  not sterile, your skin needs to be as free of germs as possible. You can reduce the number of germs on your skin by washing with CHG (chlorahexidine gluconate) Soap before surgery.  CHG is an antiseptic cleaner which kills germs and bonds with the skin to continue killing germs even after washing.    Oral Hygiene is also important to reduce your risk of infection.  Remember - BRUSH YOUR TEETH THE MORNING OF SURGERY WITH YOUR REGULAR TOOTHPASTE  Please do not use if you have an allergy to CHG or antibacterial soaps. If your skin becomes reddened/irritated stop using the CHG.  Do not shave (including legs and underarms) for at least 48 hours prior to first CHG shower. It is OK to shave your face.  Please follow these instructions carefully.   1. Shower the NIGHT BEFORE SURGERY(MON) and the MORNING OF SURGERY(TUES) with CHG Soap.   2. If you chose to wash your hair, wash your hair first as usual with your normal shampoo.  3. After you shampoo, rinse your hair and body thoroughly to remove the shampoo.  4. Use CHG as you would any other liquid soap. You can apply CHG directly to the skin and wash gently with a scrungie or a clean washcloth.   5. Apply the CHG Soap to your body ONLY FROM THE NECK DOWN.  Do not use on open wounds or open sores. Avoid  contact with your eyes, ears, mouth and genitals (private parts). Wash Face and genitals (private parts)  with your normal soap.   6. Wash thoroughly, paying special attention to the area where your surgery will be performed.  7. Thoroughly rinse your body with warm water from the neck down.  8. DO NOT shower/wash with your normal soap after using and rinsing off the CHG Soap.  9. Pat yourself dry with a CLEAN TOWEL.  10. Wear CLEAN PAJAMAS to bed the night before surgery, wear comfortable clothes the morning of surgery  11. Place CLEAN SHEETS on your bed the night of your first shower and DO NOT SLEEP WITH PETS.    Day of Surgery:  SHOWER  AS INSTRUCTED ABOVE Do not apply any deodorants/lotions.  Please wear clean clothes to the hospital/surgery center.   Remember to brush your teeth WITH YOUR REGULAR TOOTHPASTE.   Please read over the following fact sheets that you were given.  Patient informed of the hospital visitor restriction policy that is now currently in effect.

## 2018-11-17 NOTE — Progress Notes (Signed)
Patient denies shortness of breath, fever, n/v, cough and chest pain at PAT appointment.   PCP - Dr Ernestene Kiel, [place] Cardiologist -   Chest x-ray - 11/17/2018 EKG - 11/17/2018 Stress Test - Denies ECHO - 09/27/2018 Cardiac Cath - 10/12/2018  Sleep Study - Denies CPAP - n/a  Positive Staph results, rx called into [facility] in [place], patient notified.  Anesthesia review: Yes-Cardiac   Coronavirus Screening  Have you experienced the following symptoms:  Cough yes/no: No Fever (>100.54F)  yes/no: No Runny nose yes/no: No Sore throat yes/no: No Difficulty breathing/shortness of breath  yes/no: No  Have you or your wife Delores traveled in the last 14 days and where? yes/no: No  Patient verbalized understanding of instructions that were given to them at the PAT appointment. Patient was also instructed that they will need to review over the PAT instructions again at home before surgery.  Patient informed of the hospital visitation restriction policy that is now currently in effect.

## 2018-11-17 NOTE — Patient Instructions (Addendum)
   Continue taking all current medications without change through the day before surgery.  Have nothing to eat or drink after midnight the night before surgery.  On the morning of surgery take only armour thyroid with a sip of water.

## 2018-11-20 ENCOUNTER — Encounter: Payer: Medicare HMO | Admitting: Thoracic Surgery (Cardiothoracic Vascular Surgery)

## 2018-11-20 ENCOUNTER — Ambulatory Visit: Payer: Medicare HMO | Admitting: Physical Therapy

## 2018-11-20 MED ORDER — DEXMEDETOMIDINE HCL IN NACL 400 MCG/100ML IV SOLN
0.1000 ug/kg/h | INTRAVENOUS | Status: DC
  Filled 2018-11-20: qty 100

## 2018-11-20 MED ORDER — NOREPINEPHRINE BITARTRATE 1 MG/ML IV SOLN
0.0000 ug/min | INTRAVENOUS | Status: DC
  Filled 2018-11-20: qty 4

## 2018-11-20 MED ORDER — SODIUM CHLORIDE 0.9 % IV SOLN
INTRAVENOUS | Status: DC
  Filled 2018-11-20: qty 30

## 2018-11-20 MED ORDER — LEVOFLOXACIN IN D5W 500 MG/100ML IV SOLN
500.0000 mg | INTRAVENOUS | Status: AC
  Administered 2018-11-21: 08:00:00 500 mg via INTRAVENOUS
  Filled 2018-11-20: qty 100

## 2018-11-20 MED ORDER — DEXMEDETOMIDINE HCL IN NACL 400 MCG/100ML IV SOLN
0.1000 ug/kg/h | INTRAVENOUS | Status: AC
  Administered 2018-11-21: 1 ug/kg/h via INTRAVENOUS
  Filled 2018-11-20: qty 100

## 2018-11-20 MED ORDER — LEVOFLOXACIN IN D5W 500 MG/100ML IV SOLN
500.0000 mg | INTRAVENOUS | Status: DC
  Filled 2018-11-20: qty 100

## 2018-11-20 MED ORDER — MAGNESIUM SULFATE 50 % IJ SOLN
40.0000 meq | INTRAMUSCULAR | Status: DC
  Filled 2018-11-20: qty 9.85

## 2018-11-20 MED ORDER — POTASSIUM CHLORIDE 2 MEQ/ML IV SOLN
80.0000 meq | INTRAVENOUS | Status: DC
  Filled 2018-11-20: qty 40

## 2018-11-20 MED ORDER — VANCOMYCIN HCL 10 G IV SOLR
1500.0000 mg | INTRAVENOUS | Status: AC
  Administered 2018-11-21: 1500 mg via INTRAVENOUS
  Filled 2018-11-20: qty 1500

## 2018-11-20 MED ORDER — VANCOMYCIN HCL 10 G IV SOLR
1500.0000 mg | INTRAVENOUS | Status: DC
  Filled 2018-11-20: qty 1500

## 2018-11-20 NOTE — Anesthesia Preprocedure Evaluation (Addendum)
Anesthesia Evaluation  Patient identified by MRN, date of birth, ID band Patient awake    Reviewed: Allergy & Precautions  Airway Mallampati: II  TM Distance: >3 FB     Dental   Pulmonary shortness of breath, former smoker,    breath sounds clear to auscultation       Cardiovascular hypertension,  Rhythm:Regular Rate:Normal + Systolic murmurs    Neuro/Psych    GI/Hepatic negative GI ROS, Neg liver ROS,   Endo/Other  Hypothyroidism   Renal/GU negative Renal ROS     Musculoskeletal  (+) Arthritis ,   Abdominal   Peds  Hematology   Anesthesia Other Findings   Reproductive/Obstetrics                           Anesthesia Physical Anesthesia Plan  ASA: III  Anesthesia Plan: General   Post-op Pain Management:    Induction: Intravenous  PONV Risk Score and Plan: 2 and Treatment may vary due to age or medical condition  Airway Management Planned: Simple Face Mask  Additional Equipment:   Intra-op Plan:   Post-operative Plan:   Informed Consent: I have reviewed the patients History and Physical, chart, labs and discussed the procedure including the risks, benefits and alternatives for the proposed anesthesia with the patient or authorized representative who has indicated his/her understanding and acceptance.     Dental advisory given  Plan Discussed with: Anesthesiologist and CRNA  Anesthesia Plan Comments: (PAT note written 11/20/2018 by Myra Gianotti, PA-C. )      Anesthesia Quick Evaluation

## 2018-11-20 NOTE — Progress Notes (Addendum)
Anesthesia Chart Review:  Case:  [id number] Date/Time:  11/21/18 0715   Procedures:      TRANSCATHETER AORTIC VALVE REPLACEMENT, TRANSFEMORAL (N/A Chest)     TRANSESOPHAGEAL ECHOCARDIOGRAM (TEE) (N/A )   Anesthesia type:  General   Pre-op diagnosis:  Severe Aortic Stenosis   Location:  [facility] OR ROOM 16 / [place] [facility]   Surgeon:  Burnell Blanks, MD    CT surgeon: Darylene Price, MD.   DISCUSSION: Patient is a 78 year old male scheduled for the above procedure.  History includes former smoker, severe aortic stenosis, mild non-obstructive CAD (10/2018 cath), HTN, hypothyroidism, hypercholesterolemia, Peyronie's disease, exertional dyspnea.   If no acute changes then I anticipate that he can proceed as planned.   VS: BP 139/77   Pulse 63   Temp (!) 36.4 C (Oral)   Resp 18   Ht 6' (1.829 m)   Wt 100.8 kg   SpO2 100%   BMI 30.13 kg/m     PROVIDERS: Ernestene Kiel, MD is PCP  Shirlee More, MD is primary cardiologist   LABS: Labs reviewed: Acceptable for surgery. (all labs ordered are listed, but only abnormal results are displayed)  Labs Reviewed  SURGICAL PCR SCREEN - Abnormal; Notable for the following components:      Result Value   Staphylococcus aureus POSITIVE (*)    All other components within normal limits  URINALYSIS, ROUTINE W REFLEX MICROSCOPIC - Abnormal; Notable for the following components:   Color, Urine STRAW (*)    All other components within normal limits  COMPREHENSIVE METABOLIC PANEL - Abnormal; Notable for the following components:   CO2 19 (*)    All other components within normal limits  CBC - Abnormal; Notable for the following components:   Hemoglobin 17.7 (*)    All other components within normal limits  APTT  BLOOD GAS, ARTERIAL  BRAIN NATRIURETIC PEPTIDE  HEMOGLOBIN A1C  PROTIME-INR  TYPE AND SCREEN  ABO/RH    PFTs 10/17/18: FVC 4.34 (103%) FEV1 3.39 (112%) DLCO unc 24.57 (98%)   IMAGES: CXR 11/17/18: IMPRESSION: Cardiomegaly  and vascular congestion without pulmonary edema.  CTA chest/abd/pelvis 10/17/18: IMPRESSION: 1. Vascular findings and measurements pertinent to potential TAVR procedure, as detailed above. 2. Severe thickening calcification of the aortic valve, compatible with the reported clinical history of severe aortic stenosis. 3. Aortic atherosclerosis, in addition to left main and 3 vessel coronary artery disease. Assessment for potential risk factor modification, dietary therapy or pharmacologic therapy may be warranted, if clinically indicated. 4. Severe colonic diverticulosis without findings to suggest an acute diverticulitis at this time. 5. Additional incidental findings, as above. [See Full Report under Results Review tab.]   EKG: 11/17/18: NSR   CV: Carotid US 10/17/18: Summary: Right Carotid: Velocities in the right ICA are consistent with a 1-39% stenosis. Left Carotid: Velocities in the left ICA are consistent with a 1-39% stenosis. Vertebrals: Bilateral vertebral arteries demonstrate antegrade flow.  CT cardiac 10/17/18: IMPRESSION: 1. Trileaflet aortic valve with severely thickened and calcified leaflets with severely restricted leaflet opening and mild calcifications extending into the LVOT under the non-coronary leaflet. Annular measurements suitable for delivery of a 26 mm Edwards - SAPIEN 3 valve. 2. Sufficient coronary to annulus distance. 3. Optimum Fluoroscopic Angle for Delivery:  LAO 3 CAU 3. 4. No thrombus in the left atrial appendage.  Cardiac cath 10/12/18:  Prox RCA lesion is 20% stenosed.  Post Atrio lesion is 20% stenosed.  Mid Cx to Dist Cx lesion is 30%  stenosed.  Ost 1st Mrg to 1st Mrg lesion is 40% stenosed.  Prox LAD to Mid LAD lesion is 20% stenosed. 1. Mild non-obstructive CAD 2. Severe aortic stenosis (mean gradient 40.8 mmHg, peak gradient 48 mmHg) Recommendation: Will start workup for TAVR.  Echo 09/27/18: IMPRESSIONS  1. The left  ventricle has normal systolic function, with an ejection fraction of 55-60%. The cavity size was normal. There is moderate concentric left ventricular hypertrophy. Left ventricular diastolic Doppler parameters are consistent with impaired  relaxation Elevated left ventricular end-diastolic pressure No evidence of left ventricular regional wall motion abnormalities.  2. The right ventricle has normal systolic function. The cavity was normal. There is no increase in right ventricular wall thickness.  3. Left atrial size was mildly dilated.  4. The mitral valve is degenerative. There is mild to moderate mitral annular calcification present.  5. The aortic valve is tricuspid Severely thickening of the aortic valve Mild calcification of the aortic valve. Aortic valve regurgitation is mild by color flow Doppler. severe stenosis of the aortic valve. AORTIC VALVE AV Area (Vmax):    0.78 cm AV Area (Vmean):   0.65 cm AV Area (VTI):     0.70 cm AV Vmax:           441.00 cm/s AV Vmean:          330.000 cm/s AV VTI:            1.170 m AV Peak Grad:      77.8 mmHg AV Mean Grad:      49.3 mmHg LVOT Vmax:         109.00 cm/s LVOT Vmean:        68.800 cm/s LVOT VTI:          0.261 m LVOT/AV VTI ratio: 0.22  6. The aortic root, ascending aorta and aortic arch are normal in size and structure.  7. The inferior vena cava was dilated in size with >50% respiratory variability.  8. No evidence of left ventricular regional wall motion abnormalities.   Past Medical History:  Diagnosis Date  . Closed left ankle fracture 2014  . Disc disease, degenerative, cervical   . Diverticulosis    mild left colonic  . Dyspnea    with exertion  . Heart murmur   . Hypercholesterolemia 08/10/2017  . Hypertension 08/10/2017  . Hypothyroidism 08/10/2017  . Low testosterone in male 08/10/2017  . Low vitamin B12 level 08/10/2017  . Mild aortic stenosis 08/10/2017  . Neck pain 08/10/2017  . Penis disorder 08/10/2017   Perione's  disease/penile fibrosis   . 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 Vitamin D deficiency 08/10/2017  . Wears glasses    readers    Past Surgical History:  Procedure Laterality Date  . COLONOSCOPY    . MENISCUS REPAIR Left   . RIGHT/LEFT HEART CATH AND CORONARY ANGIOGRAPHY N/A 10/12/2018   Procedure: RIGHT/LEFT HEART CATH AND CORONARY ANGIOGRAPHY;  Surgeon: Burnell Blanks, MD;  Location: [facility];  Service: Cardiovascular;  Laterality: N/A;  . UMBILICAL HERNIA REPAIR      MEDICATIONS: . anastrozole (ARIMIDEX) 1 MG tablet  . ARMOUR THYROID 30 MG tablet  . cholecalciferol (VITAMIN D3) 25 MCG (1000 UT) tablet  . clindamycin (CLEOCIN) 300 MG capsule  . Glucosamine-MSM-Hyaluronic Acd (JOINT HEALTH PO)  . Melatonin 1 MG TABS  . Menaquinone-7 (VITAMIN K2 PO)  . Multiple Vitamins-Minerals (MULTIVITAMIN WITH MINERALS) tablet  . sildenafil (REVATIO) 20 MG  tablet  . telmisartan (MICARDIS) 40 MG tablet  . Testosterone 20 % CREA  . vitamin C (ASCORBIC ACID) 500 MG tablet   No current facility-administered medications for this encounter.    Derrill Memo ON 11/21/2018] dexmedetomidine (PRECEDEX) 400 MCG/100ML (4 mcg/mL) infusion  . [START ON 11/21/2018] heparin 30,000 units/NS 1000 mL solution for CELLSAVER  . [START ON 11/21/2018] levofloxacin (LEVAQUIN) IVPB 500 mg  . [START ON 11/21/2018] magnesium sulfate (IV Push/IM) injection 40 mEq  . [START ON 11/21/2018] norepinephrine (LEVOPHED) 4 mg in dextrose 5 % 250 mL (0.016 mg/mL) infusion  . [START ON 11/21/2018] potassium chloride injection 80 mEq  . [START ON 11/21/2018] vancomycin (VANCOCIN) 1,500 mg in sodium chloride 0.9 % 250 mL IVPB  Clindamycin is PRN before dental procedures.   Myra Gianotti, PA-C Surgical Short Stay/Anesthesiology [facility] Phone [phone] [facility] Phone [phone] 11/20/2018 10:20 AM

## 2018-11-20 NOTE — Progress Notes (Signed)
Denies fever, cough, shob, COVID-19 exposure, or travel since PAT appointment. 

## 2018-11-21 ENCOUNTER — Inpatient Hospital Stay (HOSPITAL_COMMUNITY): Payer: Medicare HMO | Admitting: Vascular Surgery

## 2018-11-21 ENCOUNTER — Inpatient Hospital Stay (HOSPITAL_COMMUNITY): Payer: Medicare HMO | Admitting: Certified Registered Nurse Anesthetist

## 2018-11-21 ENCOUNTER — Encounter (HOSPITAL_COMMUNITY): Payer: Self-pay

## 2018-11-21 ENCOUNTER — Inpatient Hospital Stay (HOSPITAL_COMMUNITY)
Admission: RE | Admit: 2018-11-21 | Discharge: 2018-11-22 | DRG: 267 | Disposition: A | Discharge status: Home / Self Care | Payer: Medicare HMO | Attending: Thoracic Surgery (Cardiothoracic Vascular Surgery) | Admitting: Thoracic Surgery (Cardiothoracic Vascular Surgery)

## 2018-11-21 ENCOUNTER — Encounter (HOSPITAL_COMMUNITY)
Admission: RE | Disposition: A | Discharge status: Home / Self Care | Payer: Self-pay | Source: Home / Self Care | Attending: Thoracic Surgery (Cardiothoracic Vascular Surgery)

## 2018-11-21 ENCOUNTER — Inpatient Hospital Stay (HOSPITAL_COMMUNITY): Payer: Medicare HMO

## 2018-11-21 ENCOUNTER — Other Ambulatory Visit: Payer: Self-pay | Admitting: Physician Assistant

## 2018-11-21 ENCOUNTER — Other Ambulatory Visit: Payer: Self-pay

## 2018-11-21 DIAGNOSIS — Z952 Presence of prosthetic heart valve: Secondary | ICD-10-CM | POA: Diagnosis not present

## 2018-11-21 DIAGNOSIS — E7800 Pure hypercholesterolemia, unspecified: Secondary | ICD-10-CM | POA: Diagnosis not present

## 2018-11-21 DIAGNOSIS — E039 Hypothyroidism, unspecified: Secondary | ICD-10-CM | POA: Diagnosis present

## 2018-11-21 DIAGNOSIS — Z85828 Personal history of other malignant neoplasm of skin: Secondary | ICD-10-CM

## 2018-11-21 DIAGNOSIS — I10 Essential (primary) hypertension: Secondary | ICD-10-CM | POA: Diagnosis not present

## 2018-11-21 DIAGNOSIS — Z006 Encounter for examination for normal comparison and control in clinical research program: Secondary | ICD-10-CM

## 2018-11-21 DIAGNOSIS — I700 Atherosclerosis of aorta: Secondary | ICD-10-CM | POA: Diagnosis present

## 2018-11-21 DIAGNOSIS — Z79899 Other long term (current) drug therapy: Secondary | ICD-10-CM

## 2018-11-21 DIAGNOSIS — Z8249 Family history of ischemic heart disease and other diseases of the circulatory system: Secondary | ICD-10-CM

## 2018-11-21 DIAGNOSIS — E785 Hyperlipidemia, unspecified: Secondary | ICD-10-CM | POA: Diagnosis present

## 2018-11-21 DIAGNOSIS — Z87891 Personal history of nicotine dependence: Secondary | ICD-10-CM | POA: Diagnosis not present

## 2018-11-21 DIAGNOSIS — R0600 Dyspnea, unspecified: Secondary | ICD-10-CM | POA: Diagnosis not present

## 2018-11-21 DIAGNOSIS — I2510 Atherosclerotic heart disease of native coronary artery without angina pectoris: Secondary | ICD-10-CM | POA: Diagnosis not present

## 2018-11-21 DIAGNOSIS — I350 Nonrheumatic aortic (valve) stenosis: Secondary | ICD-10-CM | POA: Diagnosis not present

## 2018-11-21 DIAGNOSIS — K5730 Diverticulosis of large intestine without perforation or abscess without bleeding: Secondary | ICD-10-CM | POA: Diagnosis present

## 2018-11-21 DIAGNOSIS — R7989 Other specified abnormal findings of blood chemistry: Secondary | ICD-10-CM | POA: Diagnosis present

## 2018-11-21 DIAGNOSIS — Z880 Allergy status to penicillin: Secondary | ICD-10-CM | POA: Diagnosis not present

## 2018-11-21 HISTORY — DX: Nonrheumatic aortic (valve) stenosis: I35.0

## 2018-11-21 HISTORY — PX: TRANSCATHETER AORTIC VALVE REPLACEMENT, TRANSFEMORAL: SHX6400

## 2018-11-21 HISTORY — PX: INTRAOPERATIVE TRANSTHORACIC ECHOCARDIOGRAM: SHX6523

## 2018-11-21 HISTORY — DX: Presence of prosthetic heart valve: Z95.2

## 2018-11-21 LAB — POCT I-STAT 7, (LYTES, BLD GAS, ICA,H+H)
Acid-base deficit: 16 mmol/L — ABNORMAL HIGH (ref 0.0–2.0)
Bicarbonate: 9.6 mmol/L — ABNORMAL LOW (ref 20.0–28.0)
Calcium, Ion: 1.06 mmol/L — ABNORMAL LOW (ref 1.15–1.40)
HCT: 16 % — ABNORMAL LOW (ref 39.0–52.0)
Hemoglobin: 5.4 g/dL — CL (ref 13.0–17.0)
O2 Saturation: 94 %
Potassium: 4 mmol/L (ref 3.5–5.1)
Sodium: 129 mmol/L — ABNORMAL LOW (ref 135–145)
TCO2: 10 mmol/L — ABNORMAL LOW (ref 22–32)
pCO2 arterial: 21.2 mmHg — ABNORMAL LOW (ref 32.0–48.0)
pH, Arterial: 7.263 — ABNORMAL LOW (ref 7.350–7.450)
pO2, Arterial: 78 mmHg — ABNORMAL LOW (ref 83.0–108.0)

## 2018-11-21 LAB — POCT I-STAT 4, (NA,K, GLUC, HGB,HCT)
Glucose, Bld: 120 mg/dL — ABNORMAL HIGH (ref 70–99)
HCT: 44 % (ref 39.0–52.0)
Hemoglobin: 15 g/dL (ref 13.0–17.0)
Potassium: 4.4 mmol/L (ref 3.5–5.1)
Sodium: 137 mmol/L (ref 135–145)

## 2018-11-21 LAB — POCT I-STAT CREATININE: Creatinine, Ser: <0.2 mg/dL — ABNORMAL LOW (ref 0.61–1.24)

## 2018-11-21 SURGERY — IMPLANTATION, AORTIC VALVE, TRANSCATHETER, FEMORAL APPROACH
Anesthesia: General | Site: Chest

## 2018-11-21 MED ORDER — LACTATED RINGERS IV SOLN
INTRAVENOUS | Status: DC | PRN
  Administered 2018-11-21: 07:00:00 via INTRAVENOUS

## 2018-11-21 MED ORDER — PROTAMINE SULFATE 10 MG/ML IV SOLN
INTRAVENOUS | Status: DC | PRN
  Administered 2018-11-21: 40 mg via INTRAVENOUS
  Administered 2018-11-21 (×2): 30 mg via INTRAVENOUS
  Administered 2018-11-21: 40 mg via INTRAVENOUS

## 2018-11-21 MED ORDER — OXYCODONE HCL 5 MG PO TABS: 5.0000 mg | ORAL_TABLET | ORAL | Status: DC | PRN

## 2018-11-21 MED ORDER — MORPHINE SULFATE (PF) 2 MG/ML IV SOLN: 1.0000 mg | INTRAVENOUS | Status: DC | PRN

## 2018-11-21 MED ORDER — SODIUM CHLORIDE 0.9 % IV SOLN
INTRAVENOUS | Status: DC | PRN
  Administered 2018-11-21: 08:00:00

## 2018-11-21 MED ORDER — ACETAMINOPHEN 650 MG RE SUPP: 650.0000 mg | Freq: Four times a day (QID) | RECTAL | Status: DC | PRN

## 2018-11-21 MED ORDER — SODIUM CHLORIDE 0.9 % IV SOLN
INTRAVENOUS | Status: AC
  Administered 2018-11-21: 11:00:00 via INTRAVENOUS

## 2018-11-21 MED ORDER — MIDAZOLAM HCL 2 MG/2ML IJ SOLN
INTRAMUSCULAR | Status: AC
  Filled 2018-11-21: qty 2

## 2018-11-21 MED ORDER — TESTOSTERONE 20 % CREA: 1.0000 "application " | TOPICAL_CREAM | Freq: Every day | Status: DC

## 2018-11-21 MED ORDER — PHENYLEPHRINE HCL-NACL 20-0.9 MG/250ML-% IV SOLN
0.0000 ug/min | INTRAVENOUS | Status: DC
  Filled 2018-11-21: qty 250

## 2018-11-21 MED ORDER — SODIUM CHLORIDE 0.9 % IV BOLUS
500.0000 mL | Freq: Once | INTRAVENOUS | Status: AC
  Administered 2018-11-21: 500 mL via INTRAVENOUS

## 2018-11-21 MED ORDER — CHLORHEXIDINE GLUCONATE 4 % EX LIQD: 60.0000 mL | Freq: Once | CUTANEOUS | Status: DC

## 2018-11-21 MED ORDER — ACETAMINOPHEN 325 MG PO TABS
650.0000 mg | ORAL_TABLET | Freq: Four times a day (QID) | ORAL | Status: DC | PRN
  Administered 2018-11-21: 650 mg via ORAL
  Filled 2018-11-21: qty 2

## 2018-11-21 MED ORDER — SODIUM CHLORIDE 0.9% FLUSH: 3.0000 mL | Freq: Two times a day (BID) | INTRAVENOUS | Status: DC

## 2018-11-21 MED ORDER — ASPIRIN 81 MG PO CHEW: 81.0000 mg | CHEWABLE_TABLET | Freq: Every day | ORAL | Status: DC

## 2018-11-21 MED ORDER — NITROGLYCERIN IN D5W 200-5 MCG/ML-% IV SOLN: 0.0000 ug/min | INTRAVENOUS | Status: DC

## 2018-11-21 MED ORDER — LEVOFLOXACIN IN D5W 750 MG/150ML IV SOLN
750.0000 mg | INTRAVENOUS | Status: DC
  Filled 2018-11-21: qty 150

## 2018-11-21 MED ORDER — CHLORHEXIDINE GLUCONATE 0.12 % MT SOLN
15.0000 mL | Freq: Once | OROMUCOSAL | Status: AC
  Administered 2018-11-21: 15 mL via OROMUCOSAL
  Filled 2018-11-21: qty 15

## 2018-11-21 MED ORDER — ONDANSETRON HCL 4 MG/2ML IJ SOLN: 4.0000 mg | Freq: Four times a day (QID) | INTRAMUSCULAR | Status: DC | PRN

## 2018-11-21 MED ORDER — 0.9 % SODIUM CHLORIDE (POUR BTL) OPTIME
TOPICAL | Status: DC | PRN
  Administered 2018-11-21: 08:00:00 1000 mL

## 2018-11-21 MED ORDER — CHLORHEXIDINE GLUCONATE 4 % EX LIQD: 30.0000 mL | CUTANEOUS | Status: DC

## 2018-11-21 MED ORDER — FENTANYL CITRATE (PF) 100 MCG/2ML IJ SOLN
INTRAMUSCULAR | Status: DC | PRN
  Administered 2018-11-21 (×2): 25 ug via INTRAVENOUS

## 2018-11-21 MED ORDER — FENTANYL CITRATE (PF) 250 MCG/5ML IJ SOLN
INTRAMUSCULAR | Status: AC
  Filled 2018-11-21: qty 5

## 2018-11-21 MED ORDER — SODIUM CHLORIDE 0.9 % IV SOLN
INTRAVENOUS | Status: DC | PRN
  Administered 2018-11-21: 20 ug/min via INTRAVENOUS

## 2018-11-21 MED ORDER — TRAMADOL HCL 50 MG PO TABS: 50.0000 mg | ORAL_TABLET | ORAL | Status: DC | PRN

## 2018-11-21 MED ORDER — IODIXANOL 320 MG/ML IV SOLN
INTRAVENOUS | Status: DC | PRN
  Administered 2018-11-21: 08:00:00 40.4 mg via INTRAVENOUS

## 2018-11-21 MED ORDER — MIDAZOLAM HCL 2 MG/2ML IJ SOLN
INTRAMUSCULAR | Status: DC | PRN
  Administered 2018-11-21: 0.5 mg via INTRAVENOUS

## 2018-11-21 MED ORDER — HEPARIN SODIUM (PORCINE) 1000 UNIT/ML IJ SOLN
INTRAMUSCULAR | Status: DC | PRN
  Administered 2018-11-21: 14000 [IU] via INTRAVENOUS

## 2018-11-21 MED ORDER — LIDOCAINE HCL (PF) 1 % IJ SOLN
INTRAMUSCULAR | Status: AC
  Filled 2018-11-21: qty 30

## 2018-11-21 MED ORDER — VANCOMYCIN HCL IN DEXTROSE 1-5 GM/200ML-% IV SOLN
1000.0000 mg | Freq: Once | INTRAVENOUS | Status: AC
  Administered 2018-11-21: 1000 mg via INTRAVENOUS
  Filled 2018-11-21 (×2): qty 200

## 2018-11-21 MED ORDER — CLOPIDOGREL BISULFATE 75 MG PO TABS
75.0000 mg | ORAL_TABLET | Freq: Every day | ORAL | Status: DC
  Administered 2018-11-22: 75 mg via ORAL
  Filled 2018-11-21: qty 1

## 2018-11-21 MED ORDER — CALCIUM CARBONATE ANTACID 500 MG PO CHEW
2.0000 | CHEWABLE_TABLET | Freq: Three times a day (TID) | ORAL | Status: DC | PRN
  Administered 2018-11-21: 400 mg via ORAL
  Filled 2018-11-21: qty 2

## 2018-11-21 MED ORDER — THYROID 30 MG PO TABS
30.0000 mg | ORAL_TABLET | Freq: Every day | ORAL | Status: DC
  Administered 2018-11-22: 30 mg via ORAL
  Filled 2018-11-21: qty 1

## 2018-11-21 MED ORDER — SODIUM CHLORIDE 0.9 % IV SOLN: 250.0000 mL | INTRAVENOUS | Status: DC | PRN

## 2018-11-21 MED ORDER — LIDOCAINE HCL 1 % IJ SOLN
INTRAMUSCULAR | Status: DC | PRN
  Administered 2018-11-21: 18 mL

## 2018-11-21 MED ORDER — SODIUM CHLORIDE 0.9 % IV SOLN: INTRAVENOUS | Status: DC

## 2018-11-21 MED ORDER — SODIUM CHLORIDE 0.9 % IV SOLN
INTRAVENOUS | Status: AC
  Filled 2018-11-21 (×3): qty 1.2

## 2018-11-21 MED ORDER — PROPOFOL 10 MG/ML IV BOLUS
INTRAVENOUS | Status: AC
  Filled 2018-11-21: qty 20

## 2018-11-21 MED ORDER — SODIUM CHLORIDE 0.9% FLUSH: 3.0000 mL | INTRAVENOUS | Status: DC | PRN

## 2018-11-21 MED FILL — Magnesium Sulfate Inj 50%: INTRAMUSCULAR | Qty: 10 | Status: AC

## 2018-11-21 MED FILL — Potassium Chloride Inj 2 mEq/ML: INTRAVENOUS | Qty: 40 | Status: AC

## 2018-11-21 MED FILL — Heparin Sodium (Porcine) Inj 1000 Unit/ML: INTRAMUSCULAR | Qty: 30 | Status: AC

## 2018-11-21 SURGICAL SUPPLY — 93 items
BAG DECANTER FOR FLEXI CONT (MISCELLANEOUS) ×1 IMPLANT
BAG SNAP BAND KOVER 36X36 (MISCELLANEOUS) ×3 IMPLANT
BLADE CLIPPER SURG (BLADE) IMPLANT
BLADE OSCILLATING /SAGITTAL (BLADE) IMPLANT
BLADE STERNUM SYSTEM 6 (BLADE) IMPLANT
BLADE SURG 10 STRL SS (BLADE) ×3 IMPLANT
CABLE ADAPT CONN TEMP 6FT (ADAPTER) ×3 IMPLANT
CANNULA FEM VENOUS REMOTE 22FR (CANNULA) IMPLANT
CANNULA OPTISITE PERFUSION 16F (CANNULA) IMPLANT
CANNULA OPTISITE PERFUSION 18F (CANNULA) IMPLANT
CATH DIAG EXPO 6F VENT PIG 145 (CATHETERS) ×6 IMPLANT
CATH EXPO 5FR AL1 (CATHETERS) IMPLANT
CATH EXTERNAL FEMALE PUREWICK (CATHETERS) IMPLANT
CATH INFINITI 6F AL2 (CATHETERS) ×1 IMPLANT
CATH S G BIP PACING (CATHETERS) ×3 IMPLANT
CHLORAPREP W/TINT 26ML (MISCELLANEOUS) ×3 IMPLANT
CLIP VESOCCLUDE MED 24/CT (CLIP) IMPLANT
CLIP VESOCCLUDE SM WIDE 24/CT (CLIP) IMPLANT
CLOSURE MYNX CONTROL 6F/7F (Vascular Products) ×1 IMPLANT
CONT SPEC 4OZ CLIKSEAL STRL BL (MISCELLANEOUS) ×6 IMPLANT
COVER BACK TABLE 80X110 HD (DRAPES) ×3 IMPLANT
COVER DOME SNAP 22 D (MISCELLANEOUS) IMPLANT
COVER WAND RF STERILE (DRAPES) ×2 IMPLANT
CRADLE DONUT ADULT HEAD (MISCELLANEOUS) ×3 IMPLANT
DECANTER SPIKE VIAL GLASS SM (MISCELLANEOUS) ×3 IMPLANT
DERMABOND ADVANCED (GAUZE/BANDAGES/DRESSINGS) ×1
DERMABOND ADVANCED .7 DNX12 (GAUZE/BANDAGES/DRESSINGS) ×2 IMPLANT
DEVICE CLOSURE PERCLS PRGLD 6F (VASCULAR PRODUCTS) ×4 IMPLANT
DRAPE INCISE IOBAN 66X45 STRL (DRAPES) IMPLANT
DRSG TEGADERM 4X4.75 (GAUZE/BANDAGES/DRESSINGS) ×6 IMPLANT
ELECT CAUTERY BLADE 6.4 (BLADE) IMPLANT
ELECT REM PT RETURN 9FT ADLT (ELECTROSURGICAL) ×3
ELECTRODE REM PT RTRN 9FT ADLT (ELECTROSURGICAL) ×4 IMPLANT
FELT TEFLON 6X6 (MISCELLANEOUS) IMPLANT
FEMORAL VENOUS CANN RAP (CANNULA) IMPLANT
GAUZE SPONGE 4X4 12PLY STRL (GAUZE/BANDAGES/DRESSINGS) ×3 IMPLANT
GLOVE BIO SURGEON STRL SZ7.5 (GLOVE) ×3 IMPLANT
GLOVE BIO SURGEON STRL SZ8 (GLOVE) IMPLANT
GLOVE EUDERMIC 7 POWDERFREE (GLOVE) IMPLANT
GLOVE ORTHO TXT STRL SZ7.5 (GLOVE) ×1 IMPLANT
GOWN STRL REUS W/ TWL LRG LVL3 (GOWN DISPOSABLE) IMPLANT
GOWN STRL REUS W/ TWL XL LVL3 (GOWN DISPOSABLE) ×2 IMPLANT
GOWN STRL REUS W/TWL LRG LVL3 (GOWN DISPOSABLE)
GOWN STRL REUS W/TWL XL LVL3 (GOWN DISPOSABLE) ×1
GUIDEWIRE SAFE TJ AMPLATZ EXST (WIRE) ×3 IMPLANT
GUIDEWIRE STRAIGHT .035 260CM (WIRE) ×3 IMPLANT
INSERT FOGARTY SM (MISCELLANEOUS) IMPLANT
KIT BASIN OR (CUSTOM PROCEDURE TRAY) ×3 IMPLANT
KIT DILATOR VASC 18G NDL (KITS) IMPLANT
KIT HEART LEFT (KITS) ×3 IMPLANT
KIT SUCTION CATH 14FR (SUCTIONS) IMPLANT
KIT TURNOVER KIT B (KITS) ×3 IMPLANT
LOOP VESSEL MAXI BLUE (MISCELLANEOUS) IMPLANT
LOOP VESSEL MINI RED (MISCELLANEOUS) IMPLANT
NEEDLE 22X1 1/2 (OR ONLY) (NEEDLE) ×3 IMPLANT
NS IRRIG 1000ML POUR BTL (IV SOLUTION) ×3 IMPLANT
PACK ENDO MINOR (CUSTOM PROCEDURE TRAY) ×3 IMPLANT
PAD ARMBOARD 7.5X6 YLW CONV (MISCELLANEOUS) ×6 IMPLANT
PAD ELECT DEFIB RADIOL ZOLL (MISCELLANEOUS) ×3 IMPLANT
PENCIL BUTTON HOLSTER BLD 10FT (ELECTRODE) IMPLANT
PERCLOSE PROGLIDE 6F (VASCULAR PRODUCTS) ×6
SET MICROPUNCTURE 5F STIFF (MISCELLANEOUS) ×3 IMPLANT
SHEATH BRITE TIP 6FR 35CM (SHEATH) ×3 IMPLANT
SHEATH PINNACLE 6F 10CM (SHEATH) ×3 IMPLANT
SHEATH PINNACLE 8F 10CM (SHEATH) ×3 IMPLANT
SLEEVE REPOSITIONING LENGTH 30 (MISCELLANEOUS) ×3 IMPLANT
SPONGE LAP 18X18 RF (DISPOSABLE) ×1 IMPLANT
SPONGE LAP 4X18 RFD (DISPOSABLE) IMPLANT
STOPCOCK MORSE 400PSI 3WAY (MISCELLANEOUS) ×6 IMPLANT
SUT ETHIBOND X763 2 0 SH 1 (SUTURE) IMPLANT
SUT GORETEX CV 4 TH 22 36 (SUTURE) IMPLANT
SUT GORETEX CV4 TH-18 (SUTURE) IMPLANT
SUT MNCRL AB 3-0 PS2 18 (SUTURE) IMPLANT
SUT PROLENE 5 0 C 1 36 (SUTURE) IMPLANT
SUT PROLENE 6 0 C 1 30 (SUTURE) IMPLANT
SUT SILK  1 MH (SUTURE) ×1
SUT SILK 1 MH (SUTURE) ×2 IMPLANT
SUT VIC AB 2-0 CT1 27 (SUTURE)
SUT VIC AB 2-0 CT1 TAPERPNT 27 (SUTURE) IMPLANT
SUT VIC AB 2-0 CTX 36 (SUTURE) IMPLANT
SUT VIC AB 3-0 SH 8-18 (SUTURE) IMPLANT
SYR 50ML LL SCALE MARK (SYRINGE) ×3 IMPLANT
SYR BULB IRRIGATION 50ML (SYRINGE) IMPLANT
SYR CONTROL 10ML LL (SYRINGE) IMPLANT
SYR MEDRAD MARK V 150ML (SYRINGE) ×3 IMPLANT
TOWEL GREEN STERILE (TOWEL DISPOSABLE) ×6 IMPLANT
TRANSDUCER W/STOPCOCK (MISCELLANEOUS) ×6 IMPLANT
TRAY FOLEY SLVR 16FR TEMP STAT (SET/KITS/TRAYS/PACK) IMPLANT
TUBING ART PRESS 72  MALE/FEM (TUBING) ×1
TUBING ART PRESS 72 MALE/FEM (TUBING) IMPLANT
URINAL MALE W/LID DISP 1000CC (MISCELLANEOUS) IMPLANT
VALVE HEART TRANSCATH SZ3 26MM (Valve) ×1 IMPLANT
WIRE .035 3MM-J 145CM (WIRE) ×3 IMPLANT

## 2018-11-21 NOTE — Progress Notes (Signed)
  Echocardiogram 2D Echocardiogram has been performed.  Aaron Foley 11/21/2018, 9:03 AM

## 2018-11-21 NOTE — Discharge Instructions (Signed)
ACTIVITY AND EXERCISE °• Daily activity and exercise are an important part of your recovery. People recover at different rates depending on their general health and type of valve procedure. °• Most people recovering from TAVR feel better relatively quickly  °• No lifting, pushing, pulling more than 10 pounds (examples to avoid: groceries, vacuuming, gardening, golfing): °            - For one week with a procedure through the groin. °            - For six weeks for procedures through the chest wall or neck °NOTE: You will typically see one of our providers 7-14 days after your procedure to discuss WHEN TO RESUME the above activities.  °  °  °DRIVING °• Do not drive for until you are seen for follow up and cleared by a provider. Generally, we ask patient to not drive for 1 week after their procedure. °• If you have been told by your doctor in the past that you may not drive, you must talk with him/her before you begin driving again. °  °  °DRESSING °• Groin site: you may leave the clear dressing over the site for up to one week or until it falls off. °  °  °HYGIENE °• If you had a femoral (leg) procedure, you may take a shower when you return home. After the shower, pat the site dry. Do NOT use powder, oils or lotions in your groin area until the site has completely healed. °• If you had a chest procedure, you may shower when you return home unless specifically instructed not to by your discharging practitioner. °            - DO NOT scrub incision; pat dry with a towel °            - DO NOT apply any lotions, oils, powders to the incision °            - No tub baths / swimming for at least 2 weeks. °• If you notice any fevers, chills, increased pain, swelling, bleeding or pus, please contact your doctor. °  °ADDITIONAL INFORMATION °• If you are going to have an upcoming dental procedure, please contact our office as you will require antibiotics ahead of time to prevent infection on your heart valve.  ° ° °If you  have any questions or concerns you can call the structural heart phone during normal business hours 8am-4pm. If you have an urgent need after hours or weekends please call [phone] to talk to the on call provider for general cardiology. If you have an emergency that requires immediate attention, please call 911.  ° ° °After TAVR Checklist ° °Check  Test Description  ° Follow up appointment in 1-2 weeks  You will see our structural heart physician assistant, Katie Treasure Ochs. Your incision sites will be checked and you will be cleared to drive and resume all normal activities if you are doing well.    ° 1 month echo and follow up  You will have an echo to check on your new heart valve and be seen back in the office by Katie Dmitriy Gair. Many times the echo is not read by your appointment time, but Katie will call you later that day or the following day to report your results.  ° Follow up with your primary cardiologist You will need to be seen by your primary cardiologist in the following 3-6 months after your 1   month appointment in the valve clinic. Often times your Plavix or Aspirin will be discontinued during this time, but this is decided on a case by case basis.    1 year echo and follow up You will have another echo to check on your heart valve after 1 year and be seen back in the office by Nell Range. This your last structural heart visit.   Bacterial endocarditis prophylaxis  You will have to take antibiotics for the rest of your life before all dental procedures (even teeth cleanings) to protect your heart valve. Antibiotics are also required before some surgeries. Please check with your cardiologist before scheduling any surgeries. Also, please make sure to tell us if you have a penicillin allergy as you will require an alternative antibiotic.     YOUR CARDIOLOGY TEAM HAS ARRANGED FOR AN E-VISIT FOR YOUR APPOINTMENT - PLEASE REVIEW IMPORTANT INFORMATION BELOW SEVERAL DAYS PRIOR TO YOUR  APPOINTMENT  Due to the recent COVID-19 pandemic, we are transitioning in-person office visits to tele-medicine visits in an effort to decrease unnecessary exposure to our patients, their families, and staff. These visits are billed to your insurance just like a normal visit is. We also encourage you to sign up for MyChart if you have not already done so. You will need a smartphone if possible. For patients that do not have this, we can still complete the visit using a regular telephone but do prefer a smartphone to enable video when possible. You may have a family member that lives with you that can help. If possible, we also ask that you have a blood pressure cuff and scale at home to measure your blood pressure, heart rate and weight prior to your scheduled appointment. Patients with clinical needs that need an in-person evaluation and testing will still be able to come to the office if absolutely necessary. If you have any questions, feel free to call our office.     YOUR PROVIDER WILL BE USING THE FOLLOWING PLATFORM TO COMPLETE YOUR VISIT: [email or web address] All you need is a smart phone. You will receive a text message from the provider through the [email or web address] app. You will follow the prompts and it will take you to a virtual visit with your provider. Please check your blood pressure, heart rate and weight prior to your scheduled appointment.  CONSENT FOR TELE-HEALTH VISIT - PLEASE REVIEW  I hereby voluntarily request, consent and authorize [facility] and its employed or contracted physicians, physician assistants, nurse practitioners or other licensed health care professionals (the Practitioner), to provide me with telemedicine health care services (the Services") as deemed necessary by the treating Practitioner. I acknowledge and consent to receive the Services by the Practitioner via telemedicine. I understand that the telemedicine visit will involve communicating with the Practitioner through live  audiovisual communication technology and the disclosure of certain medical information by electronic transmission. I acknowledge that I have been given the opportunity to request an in-person assessment or other available alternative prior to the telemedicine visit and am voluntarily participating in the telemedicine visit.  I understand that I have the right to withhold or withdraw my consent to the use of telemedicine in the course of my care at any time, without affecting my right to future care or treatment, and that the Practitioner or I may terminate the telemedicine visit at any time. I understand that I have the right to inspect all information obtained and/or recorded in the course of the telemedicine visit and may receive copies  of available information for a reasonable fee.  I understand that some of the potential risks of receiving the Services via telemedicine include:   Delay or interruption in medical evaluation due to technological equipment failure or disruption;  Information transmitted may not be sufficient (e.g. poor resolution of images) to allow for appropriate medical decision making by the Practitioner; and/or   In rare instances, security protocols could fail, causing a breach of personal health information.  Furthermore, I acknowledge that it is my responsibility to provide information about my medical history, conditions and care that is complete and accurate to the best of my ability. I acknowledge that Practitioner's advice, recommendations, and/or decision may be based on factors not within their control, such as incomplete or inaccurate data provided by me or distortions of diagnostic images or specimens that may result from electronic transmissions. I understand that the practice of medicine is not an exact science and that Practitioner makes no warranties or guarantees regarding treatment outcomes. I acknowledge that I will receive a copy of this consent concurrently upon  execution via email to the email address I last provided but may also request a printed copy by calling the office of [facility].    I understand that my insurance will be billed for this visit.   I have read or had this consent read to me.  I understand the contents of this consent, which adequately explains the benefits and risks of the Services being provided via telemedicine.   I have been provided ample opportunity to ask questions regarding this consent and the Services and have had my questions answered to my satisfaction.  I give my informed consent for the services to be provided through the use of telemedicine in my medical care  By participating in this telemedicine visit I agree to the above.

## 2018-11-21 NOTE — CV Procedure (Signed)
[facility]  TAVR OPERATIVE NOTE   Date of Procedure:  11/21/2018  Preoperative Diagnosis: Severe Aortic Stenosis   Postoperative Diagnosis: Same   Procedure:    Transcatheter Aortic Valve Replacement - Transfemoral Approach  Edwards Sapien 3 THV (size 26 mm, model # [id number], serial # [id number])   Co-Surgeons:  Lauree Chandler, MD and Valentina Gu. Roxy Manns, MD   Anesthesiologist:  Nyoka Cowden  Echocardiographer:  Meda Coffee  Pre-operative Echo Findings:  Severe aortic stenosis  Normal left ventricular systolic function  Post-operative Echo Findings:  No paravalvular leak  Normal left ventricular systolic function  BRIEF CLINICAL NOTE AND INDICATIONS FOR SURGERY  Mr. Aaron Foley is a 78 yo male with a history of HTN, HLD and severe aortic stenosis here today for TAVR. He has been documented to have severe aortic stenosis with mean gradient 49 mmHg by echo. Cardiac cath with mild non-obstructive disease. He reports dizziness and chest pressure as well as exertional dyspnea.   During the course of the patient's preoperative work up they have been evaluated comprehensively by a multidisciplinary team of specialists coordinated through the [facility] in the [facility].  They have been demonstrated to suffer from symptomatic severe aortic stenosis as noted above. The patient has been counseled extensively as to the relative risks and benefits of all options for the treatment of severe aortic stenosis including long term medical therapy, conventional surgery for aortic valve replacement, and transcatheter aortic valve replacement.  The patient has been independently evaluated by Dr. Roxy Manns with CT surgery and they are felt to be at high risk for conventional surgical aortic valve replacement. The surgeon indicated the patient would be a poor candidate for conventional surgery. Based upon review of all of the patient's preoperative  diagnostic tests they are felt to be candidate for transcatheter aortic valve replacement using the transfemoral approach as an alternative to high risk conventional surgery.    Following the decision to proceed with transcatheter aortic valve replacement, a discussion has been held regarding what types of management strategies would be attempted intraoperatively in the event of life-threatening complications, including whether or not the patient would be considered a candidate for the use of cardiopulmonary bypass and/or conversion to open sternotomy for attempted surgical intervention.  The patient has been advised of a variety of complications that might develop peculiar to this approach including but not limited to risks of death, stroke, paravalvular leak, aortic dissection or other major vascular complications, aortic annulus rupture, device embolization, cardiac rupture or perforation, acute myocardial infarction, arrhythmia, heart block or bradycardia requiring permanent pacemaker placement, congestive heart failure, respiratory failure, renal failure, pneumonia, infection, other late complications related to structural valve deterioration or migration, or other complications that might ultimately cause a temporary or permanent loss of functional independence or other long term morbidity.  The patient provides full informed consent for the procedure as described and all questions were answered preoperatively.  DETAILS OF THE OPERATIVE PROCEDURE  PREPARATION:   The patient is brought to the operating room on the above mentioned date and central monitoring was established by the anesthesia team including placement of a radial arterial line. The patient is placed in the supine position on the operating table.  Intravenous antibiotics are administered. Conscious sedation is used.   Baseline transthoracic echocardiogram was performed. The patient's chest, abdomen, both groins, and both lower extremities  are prepared and draped in a sterile manner. A time out procedure is performed.  PERIPHERAL  ACCESS:   Using the modified Seldinger technique, femoral arterial and venous access were obtained with placement of a 6 Fr sheath on the left side in the femoral artery and a 7 French sheath on the left side in the vein with u/s guidance.  A pigtail diagnostic catheter was passed through the femoral arterial sheath under fluoroscopic guidance into the aortic root.  A temporary transvenous pacemaker catheter was passed through the femoral venous sheath under fluoroscopic guidance into the right ventricle.  The pacemaker was tested to ensure stable lead placement and pacemaker capture. Aortic root angiography was performed in order to determine the optimal angiographic angle for valve deployment.  TRANSFEMORAL ACCESS:  A micropuncture kit was used to gain access to the right femoral artery using u/s guidance. Position confirmed with angiography. Pre-closure with double ProGlide closure devices. The patient was heparinized systemically and ACT verified > 250 seconds.    A 14 Fr transfemoral E-sheath was introduced into the right femoral artery after progressively dilating over an Amplatz superstiff wire. An AL-2 catheter was used to direct a straight-tip exchange length wire across the native aortic valve into the left ventricle. This was exchanged out for a pigtail catheter and position was confirmed in the LV apex. Simultaneous LV and Ao pressures were recorded.  The pigtail catheter was then exchanged for an Amplatz Extra-stiff wire in the LV apex.   TRANSCATHETER HEART VALVE DEPLOYMENT:  An Edwards Sapien 3 THV (size 26 mm) was prepared and crimped per manufacturer's guidelines, and the proper orientation of the valve is confirmed on the [employer] delivery system. The valve was advanced through the introducer sheath using normal technique until in an appropriate position in the abdominal aorta beyond the  sheath tip. The balloon was then retracted and using the fine-tuning wheel was centered on the valve. The valve was then advanced across the aortic arch using appropriate flexion of the catheter. The valve was carefully positioned across the aortic valve annulus. The Commander catheter was retracted using normal technique. Once final position of the valve has been confirmed by angiographic assessment, the valve is deployed while temporarily holding ventilation and during rapid ventricular pacing to maintain systolic blood pressure < 50 mmHg and pulse pressure < 10 mmHg. The balloon inflation is held for >3 seconds after reaching full deployment volume. Once the balloon has fully deflated the balloon is retracted into the ascending aorta and valve function is assessed using TTE. There is felt to be no paravalvular leak and no central aortic insufficiency.  The patient's hemodynamic recovery following valve deployment is good.  The deployment balloon and guidewire are both removed. Echo demostrated acceptable post-procedural gradients, stable mitral valve function, and no AI.   PROCEDURE COMPLETION:  The sheath was then removed and closure devices were completed. Protamine was administered once femoral arterial repair was complete. The temporary pacemaker, pigtail catheters and femoral sheaths were removed with a Mynx closure device placed in the left femoral artery. Manual pressure used for venous hemostasis on the left side.    The patient tolerated the procedure well and is transported to the surgical intensive care in stable condition. There were no immediate intraoperative complications. All sponge instrument and needle counts are verified correct at completion of the operation.   No blood products were administered during the operation.  The patient received a total of 40.4 mL of intravenous contrast during the procedure.  Lauree Chandler MD 11/21/2018 9:25 AM

## 2018-11-21 NOTE — Progress Notes (Addendum)
  [facility] TE[facility]  Patient doing well s/p TAVR. His BP is soft but responding well to IV fluids. Groin sites stable. ECG with sinus and no high grade block. I stat labs likely erroneous as pulled off venous line. Repeat labs tomorrow AM. Arterial line discontinued and plan to transfer  to 4E. Plan for early ambulation after bedrest completed and hopeful discharge over the next 24-48 hours.   Angelena Form PA-C  [facility]  Pager [phone]

## 2018-11-21 NOTE — H&P (Signed)
Cardiology Admission History and Physical:   Patient ID: Aaron Foley MRN: [id number]; DOB: March 10, 1941   Admission date: 11/21/2018  Primary Care Provider: Ernestene Kiel, MD Primary Cardiologist: No primary care provider on file. [facility] Primary Electrophysiologist:    Chief Complaint: dizziness, chest pressure, dyspnea  History of Present Illness:   Aaron Foley is a 78 yo male with a history of HTN, HLD and severe aortic stenosis here today for TAVR. He has been documented to have severe aortic stenosis with mean gradient 49 mmHg by echo. Cardiac cath with mild non-obstructive disease. He reports dizziness and chest pressure as well as exertional dyspnea.    Past Medical History:  Diagnosis Date  . Closed left ankle fracture 2014  . Disc disease, degenerative, cervical   . Diverticulosis    mild left colonic  . Dyspnea    with exertion  . Heart murmur   . Hypercholesterolemia 08/10/2017  . Hypertension 08/10/2017  . Hypothyroidism 08/10/2017  . Low testosterone in male 08/10/2017  . Low vitamin B12 level 08/10/2017  . Mild aortic stenosis 08/10/2017  . Neck pain 08/10/2017  . 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 Vitamin D deficiency 08/10/2017  . Wears glasses    readers    Past Surgical History:  Procedure Laterality Date  . COLONOSCOPY    . MENISCUS REPAIR Left   . RIGHT/LEFT HEART CATH AND CORONARY ANGIOGRAPHY N/A 10/12/2018   Procedure: RIGHT/LEFT HEART CATH AND CORONARY ANGIOGRAPHY;  Surgeon: Burnell Blanks, MD;  Location: [facility];  Service: Cardiovascular;  Laterality: N/A;  . UMBILICAL HERNIA REPAIR       Medications Prior to Admission: Prior to Admission medications   Medication Sig Start Date End Date Taking? Authorizing Provider  anastrozole (ARIMIDEX) 1 MG tablet Take 0.25 mg by mouth 2 (two) times a week. Pt states he is taking 1/4 of a tablet 2 times a week  12/11/15  Yes [provider]  ARMOUR THYROID 30 MG tablet Take 30 mg by mouth daily before breakfast.  06/02/17  Yes [provider]  cholecalciferol (VITAMIN D3) 25 MCG (1000 UT) tablet Take 1,000 Units by mouth daily.   Yes [provider]  Glucosamine-MSM-Hyaluronic Acd (JOINT HEALTH PO) Take 1 capsule by mouth daily.   Yes [provider]  Melatonin 1 MG TABS Take 0.5 tablets by mouth at bedtime.    Yes [provider]  Menaquinone-7 (VITAMIN K2 PO) Take 1 tablet by mouth daily.   Yes [provider]  Multiple Vitamins-Minerals (MULTIVITAMIN WITH MINERALS) tablet Take 1 tablet by mouth daily.   Yes [provider]  sildenafil (REVATIO) 20 MG tablet Take 20 mg by mouth daily as needed (ED).    Yes [provider]  telmisartan (MICARDIS) 40 MG tablet Take 40 mg by mouth at bedtime.  10/20/15  Yes [provider]  Testosterone 20 % CREA Apply 1 application topically daily.    Yes [provider]  vitamin C (ASCORBIC ACID) 500 MG tablet Take 500 mg by mouth daily.   Yes [provider]  clindamycin (CLEOCIN) 300 MG capsule TAKE 2 CAPSULES BY MOUTH DAILY *take 30 minutes before appointment* Patient taking differently: Take 600 mg by mouth See admin instructions. FOR DENTAL PROCEDURES 08/24/18   Richardo Priest, MD     Allergies:    Allergies  Allergen Reactions  . Penicillins Rash and  bruits; FA pulses 2+ bilaterally without bruits  Cardiac:  normal S1, S2; RRR; systolic murmur Lungs:  clear to auscultation bilaterally, no wheezing, rhonchi or rales  Abd: soft, nontender, no hepatomegaly  Ext: no LE edema Musculoskeletal:  No deformities, BUE and BLE strength normal and equal Skin: warm and dry  Neuro:  CNs 2-12 intact, no focal abnormalities noted Psych:  Normal affect   Relevant CV Studies:  ECHOCARDIOGRAM REPORT   Patient Name: Aaron Foley Date of Exam: 09/27/2018 Medical Rec #: [id number] Height: 71.0 in Accession #: [id number] Weight: 223.4 lb Date of Birth: 08-Apr-1941 BSA: 2.21 m Patient Age: 49 years BP: 134/84 mmHg Patient Gender: M HR: 64 bpm. Exam Location: [place]   Procedure: 2D Echo  Indications: Aortic Stenosis 424.1 / 135.0  History: Patient has prior history of Echocardiogram examinations, most recent 07/21/2017. Signs/Symptoms: Chest Pain; Risk Factors: Hypertension and Dyslipidemia.  Sonographer: Luane School Referring Phys: [phone] [place]   1. The left ventricle has normal systolic function, with an ejection fraction of 55-60%. The cavity size was normal. There is moderate concentric left ventricular hypertrophy. Left ventricular diastolic Doppler parameters are consistent with impaired  relaxation Elevated left ventricular end-diastolic pressure No evidence of left ventricular regional wall motion abnormalities. 2. The right  ventricle has normal systolic function. The cavity was normal. There is no increase in right ventricular wall thickness. 3. Left atrial size was mildly dilated. 4. The mitral valve is degenerative. There is mild to moderate mitral annular calcification present. 5. The aortic valve is tricuspid Severely thickening of the aortic valve Mild calcification of the aortic valve. Aortic valve regurgitation is mild by color flow Doppler. severe stenosis of the aortic valve. 6. The aortic root, ascending aorta and aortic arch are normal in size and structure. 7. The inferior vena cava was dilated in size with >50% respiratory variability. 8. No evidence of left ventricular regional wall motion abnormalities.  FINDINGS Left Ventricle: The left ventricle has normal systolic function, with an ejection fraction of 55-60%. The cavity size was normal. There is moderate concentric left ventricular hypertrophy. Left ventricular diastolic Doppler parameters are consistent  with impaired relaxation Elevated left ventricular end-diastolic pressure No evidence of left ventricular regional wall motion abnormalities.. Right Ventricle: The right ventricle has normal systolic function. The cavity was normal. There is no increase in right ventricular wall thickness. Left Atrium: left atrial size was mildly dilated Right Atrium: right atrial size was normal in size. Right atrial pressure is estimated at 8 mmHg. Interatrial Septum: No atrial level shunt detected by color flow Doppler. Pericardium: There is no evidence of pericardial effusion. Mitral Valve: The mitral valve is degenerative in appearance. There is mild to moderate mitral annular calcification present. Mitral valve regurgitation is not visualized by color flow Doppler. Tricuspid Valve: The tricuspid valve is normal in structure. Tricuspid valve regurgitation was not visualized by color flow Doppler. Aortic Valve: The aortic valve is tricuspid Severely  thickening of the aortic valve Mild calcification of the aortic valve, with severely decreased cusp excursion. Aortic valve regurgitation is mild by color flow Doppler. There is severe stenosis of the  aortic valve, with a calculated valve area of 0.70 cm. Pulmonic Valve: The pulmonic valve was normal in structure. Pulmonic valve regurgitation is not visualized by color flow Doppler. No evidence of pulmonic stenosis. Aorta: The aortic root, ascending aorta and aortic arch are normal in size and structure. Pulmonary Artery: The pulmonary artery is of normal size and  bruits; FA pulses 2+ bilaterally without bruits  Cardiac:  normal S1, S2; RRR; systolic murmur Lungs:  clear to auscultation bilaterally, no wheezing, rhonchi or rales  Abd: soft, nontender, no hepatomegaly  Ext: no LE edema Musculoskeletal:  No deformities, BUE and BLE strength normal and equal Skin: warm and dry  Neuro:  CNs 2-12 intact, no focal abnormalities noted Psych:  Normal affect   Relevant CV Studies:  ECHOCARDIOGRAM REPORT   Patient Name: Aaron Foley Date of Exam: 09/27/2018 Medical Rec #: [id number] Height: 71.0 in Accession #: [id number] Weight: 223.4 lb Date of Birth: 08-Apr-1941 BSA: 2.21 m Patient Age: 49 years BP: 134/84 mmHg Patient Gender: M HR: 64 bpm. Exam Location: [place]   Procedure: 2D Echo  Indications: Aortic Stenosis 424.1 / 135.0  History: Patient has prior history of Echocardiogram examinations, most recent 07/21/2017. Signs/Symptoms: Chest Pain; Risk Factors: Hypertension and Dyslipidemia.  Sonographer: Luane School Referring Phys: [phone] [place]   1. The left ventricle has normal systolic function, with an ejection fraction of 55-60%. The cavity size was normal. There is moderate concentric left ventricular hypertrophy. Left ventricular diastolic Doppler parameters are consistent with impaired  relaxation Elevated left ventricular end-diastolic pressure No evidence of left ventricular regional wall motion abnormalities. 2. The right  ventricle has normal systolic function. The cavity was normal. There is no increase in right ventricular wall thickness. 3. Left atrial size was mildly dilated. 4. The mitral valve is degenerative. There is mild to moderate mitral annular calcification present. 5. The aortic valve is tricuspid Severely thickening of the aortic valve Mild calcification of the aortic valve. Aortic valve regurgitation is mild by color flow Doppler. severe stenosis of the aortic valve. 6. The aortic root, ascending aorta and aortic arch are normal in size and structure. 7. The inferior vena cava was dilated in size with >50% respiratory variability. 8. No evidence of left ventricular regional wall motion abnormalities.  FINDINGS Left Ventricle: The left ventricle has normal systolic function, with an ejection fraction of 55-60%. The cavity size was normal. There is moderate concentric left ventricular hypertrophy. Left ventricular diastolic Doppler parameters are consistent  with impaired relaxation Elevated left ventricular end-diastolic pressure No evidence of left ventricular regional wall motion abnormalities.. Right Ventricle: The right ventricle has normal systolic function. The cavity was normal. There is no increase in right ventricular wall thickness. Left Atrium: left atrial size was mildly dilated Right Atrium: right atrial size was normal in size. Right atrial pressure is estimated at 8 mmHg. Interatrial Septum: No atrial level shunt detected by color flow Doppler. Pericardium: There is no evidence of pericardial effusion. Mitral Valve: The mitral valve is degenerative in appearance. There is mild to moderate mitral annular calcification present. Mitral valve regurgitation is not visualized by color flow Doppler. Tricuspid Valve: The tricuspid valve is normal in structure. Tricuspid valve regurgitation was not visualized by color flow Doppler. Aortic Valve: The aortic valve is tricuspid Severely  thickening of the aortic valve Mild calcification of the aortic valve, with severely decreased cusp excursion. Aortic valve regurgitation is mild by color flow Doppler. There is severe stenosis of the  aortic valve, with a calculated valve area of 0.70 cm. Pulmonic Valve: The pulmonic valve was normal in structure. Pulmonic valve regurgitation is not visualized by color flow Doppler. No evidence of pulmonic stenosis. Aorta: The aortic root, ascending aorta and aortic arch are normal in size and structure. Pulmonary Artery: The pulmonary artery is of normal size and  bruits; FA pulses 2+ bilaterally without bruits  Cardiac:  normal S1, S2; RRR; systolic murmur Lungs:  clear to auscultation bilaterally, no wheezing, rhonchi or rales  Abd: soft, nontender, no hepatomegaly  Ext: no LE edema Musculoskeletal:  No deformities, BUE and BLE strength normal and equal Skin: warm and dry  Neuro:  CNs 2-12 intact, no focal abnormalities noted Psych:  Normal affect   Relevant CV Studies:  ECHOCARDIOGRAM REPORT   Patient Name: Aaron Foley Date of Exam: 09/27/2018 Medical Rec #: [id number] Height: 71.0 in Accession #: [id number] Weight: 223.4 lb Date of Birth: 08-Apr-1941 BSA: 2.21 m Patient Age: 49 years BP: 134/84 mmHg Patient Gender: M HR: 64 bpm. Exam Location: [place]   Procedure: 2D Echo  Indications: Aortic Stenosis 424.1 / 135.0  History: Patient has prior history of Echocardiogram examinations, most recent 07/21/2017. Signs/Symptoms: Chest Pain; Risk Factors: Hypertension and Dyslipidemia.  Sonographer: Luane School Referring Phys: [phone] [place]   1. The left ventricle has normal systolic function, with an ejection fraction of 55-60%. The cavity size was normal. There is moderate concentric left ventricular hypertrophy. Left ventricular diastolic Doppler parameters are consistent with impaired  relaxation Elevated left ventricular end-diastolic pressure No evidence of left ventricular regional wall motion abnormalities. 2. The right  ventricle has normal systolic function. The cavity was normal. There is no increase in right ventricular wall thickness. 3. Left atrial size was mildly dilated. 4. The mitral valve is degenerative. There is mild to moderate mitral annular calcification present. 5. The aortic valve is tricuspid Severely thickening of the aortic valve Mild calcification of the aortic valve. Aortic valve regurgitation is mild by color flow Doppler. severe stenosis of the aortic valve. 6. The aortic root, ascending aorta and aortic arch are normal in size and structure. 7. The inferior vena cava was dilated in size with >50% respiratory variability. 8. No evidence of left ventricular regional wall motion abnormalities.  FINDINGS Left Ventricle: The left ventricle has normal systolic function, with an ejection fraction of 55-60%. The cavity size was normal. There is moderate concentric left ventricular hypertrophy. Left ventricular diastolic Doppler parameters are consistent  with impaired relaxation Elevated left ventricular end-diastolic pressure No evidence of left ventricular regional wall motion abnormalities.. Right Ventricle: The right ventricle has normal systolic function. The cavity was normal. There is no increase in right ventricular wall thickness. Left Atrium: left atrial size was mildly dilated Right Atrium: right atrial size was normal in size. Right atrial pressure is estimated at 8 mmHg. Interatrial Septum: No atrial level shunt detected by color flow Doppler. Pericardium: There is no evidence of pericardial effusion. Mitral Valve: The mitral valve is degenerative in appearance. There is mild to moderate mitral annular calcification present. Mitral valve regurgitation is not visualized by color flow Doppler. Tricuspid Valve: The tricuspid valve is normal in structure. Tricuspid valve regurgitation was not visualized by color flow Doppler. Aortic Valve: The aortic valve is tricuspid Severely  thickening of the aortic valve Mild calcification of the aortic valve, with severely decreased cusp excursion. Aortic valve regurgitation is mild by color flow Doppler. There is severe stenosis of the  aortic valve, with a calculated valve area of 0.70 cm. Pulmonic Valve: The pulmonic valve was normal in structure. Pulmonic valve regurgitation is not visualized by color flow Doppler. No evidence of pulmonic stenosis. Aorta: The aortic root, ascending aorta and aortic arch are normal in size and structure. Pulmonary Artery: The pulmonary artery is of normal size and  Cardiology Admission History and Physical:   Patient ID: Aaron Foley MRN: [id number]; DOB: March 10, 1941   Admission date: 11/21/2018  Primary Care Provider: Ernestene Kiel, MD Primary Cardiologist: No primary care provider on file. [facility] Primary Electrophysiologist:    Chief Complaint: dizziness, chest pressure, dyspnea  History of Present Illness:   Aaron Foley is a 78 yo male with a history of HTN, HLD and severe aortic stenosis here today for TAVR. He has been documented to have severe aortic stenosis with mean gradient 49 mmHg by echo. Cardiac cath with mild non-obstructive disease. He reports dizziness and chest pressure as well as exertional dyspnea.    Past Medical History:  Diagnosis Date  . Closed left ankle fracture 2014  . Disc disease, degenerative, cervical   . Diverticulosis    mild left colonic  . Dyspnea    with exertion  . Heart murmur   . Hypercholesterolemia 08/10/2017  . Hypertension 08/10/2017  . Hypothyroidism 08/10/2017  . Low testosterone in male 08/10/2017  . Low vitamin B12 level 08/10/2017  . Mild aortic stenosis 08/10/2017  . Neck pain 08/10/2017  . 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 Vitamin D deficiency 08/10/2017  . Wears glasses    readers    Past Surgical History:  Procedure Laterality Date  . COLONOSCOPY    . MENISCUS REPAIR Left   . RIGHT/LEFT HEART CATH AND CORONARY ANGIOGRAPHY N/A 10/12/2018   Procedure: RIGHT/LEFT HEART CATH AND CORONARY ANGIOGRAPHY;  Surgeon: Burnell Blanks, MD;  Location: [facility];  Service: Cardiovascular;  Laterality: N/A;  . UMBILICAL HERNIA REPAIR       Medications Prior to Admission: Prior to Admission medications   Medication Sig Start Date End Date Taking? Authorizing Provider  anastrozole (ARIMIDEX) 1 MG tablet Take 0.25 mg by mouth 2 (two) times a week. Pt states he is taking 1/4 of a tablet 2 times a week  12/11/15  Yes [provider]  ARMOUR THYROID 30 MG tablet Take 30 mg by mouth daily before breakfast.  06/02/17  Yes [provider]  cholecalciferol (VITAMIN D3) 25 MCG (1000 UT) tablet Take 1,000 Units by mouth daily.   Yes [provider]  Glucosamine-MSM-Hyaluronic Acd (JOINT HEALTH PO) Take 1 capsule by mouth daily.   Yes [provider]  Melatonin 1 MG TABS Take 0.5 tablets by mouth at bedtime.    Yes [provider]  Menaquinone-7 (VITAMIN K2 PO) Take 1 tablet by mouth daily.   Yes [provider]  Multiple Vitamins-Minerals (MULTIVITAMIN WITH MINERALS) tablet Take 1 tablet by mouth daily.   Yes [provider]  sildenafil (REVATIO) 20 MG tablet Take 20 mg by mouth daily as needed (ED).    Yes [provider]  telmisartan (MICARDIS) 40 MG tablet Take 40 mg by mouth at bedtime.  10/20/15  Yes [provider]  Testosterone 20 % CREA Apply 1 application topically daily.    Yes [provider]  vitamin C (ASCORBIC ACID) 500 MG tablet Take 500 mg by mouth daily.   Yes [provider]  clindamycin (CLEOCIN) 300 MG capsule TAKE 2 CAPSULES BY MOUTH DAILY *take 30 minutes before appointment* Patient taking differently: Take 600 mg by mouth See admin instructions. FOR DENTAL PROCEDURES 08/24/18   Richardo Priest, MD     Allergies:    Allergies  Allergen Reactions  . Penicillins Rash and  Cardiology Admission History and Physical:   Patient ID: Aaron Foley MRN: [id number]; DOB: March 10, 1941   Admission date: 11/21/2018  Primary Care Provider: Ernestene Kiel, MD Primary Cardiologist: No primary care provider on file. [facility] Primary Electrophysiologist:    Chief Complaint: dizziness, chest pressure, dyspnea  History of Present Illness:   Aaron Foley is a 78 yo male with a history of HTN, HLD and severe aortic stenosis here today for TAVR. He has been documented to have severe aortic stenosis with mean gradient 49 mmHg by echo. Cardiac cath with mild non-obstructive disease. He reports dizziness and chest pressure as well as exertional dyspnea.    Past Medical History:  Diagnosis Date  . Closed left ankle fracture 2014  . Disc disease, degenerative, cervical   . Diverticulosis    mild left colonic  . Dyspnea    with exertion  . Heart murmur   . Hypercholesterolemia 08/10/2017  . Hypertension 08/10/2017  . Hypothyroidism 08/10/2017  . Low testosterone in male 08/10/2017  . Low vitamin B12 level 08/10/2017  . Mild aortic stenosis 08/10/2017  . Neck pain 08/10/2017  . 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 Vitamin D deficiency 08/10/2017  . Wears glasses    readers    Past Surgical History:  Procedure Laterality Date  . COLONOSCOPY    . MENISCUS REPAIR Left   . RIGHT/LEFT HEART CATH AND CORONARY ANGIOGRAPHY N/A 10/12/2018   Procedure: RIGHT/LEFT HEART CATH AND CORONARY ANGIOGRAPHY;  Surgeon: Burnell Blanks, MD;  Location: [facility];  Service: Cardiovascular;  Laterality: N/A;  . UMBILICAL HERNIA REPAIR       Medications Prior to Admission: Prior to Admission medications   Medication Sig Start Date End Date Taking? Authorizing Provider  anastrozole (ARIMIDEX) 1 MG tablet Take 0.25 mg by mouth 2 (two) times a week. Pt states he is taking 1/4 of a tablet 2 times a week  12/11/15  Yes [provider]  ARMOUR THYROID 30 MG tablet Take 30 mg by mouth daily before breakfast.  06/02/17  Yes [provider]  cholecalciferol (VITAMIN D3) 25 MCG (1000 UT) tablet Take 1,000 Units by mouth daily.   Yes [provider]  Glucosamine-MSM-Hyaluronic Acd (JOINT HEALTH PO) Take 1 capsule by mouth daily.   Yes [provider]  Melatonin 1 MG TABS Take 0.5 tablets by mouth at bedtime.    Yes [provider]  Menaquinone-7 (VITAMIN K2 PO) Take 1 tablet by mouth daily.   Yes [provider]  Multiple Vitamins-Minerals (MULTIVITAMIN WITH MINERALS) tablet Take 1 tablet by mouth daily.   Yes [provider]  sildenafil (REVATIO) 20 MG tablet Take 20 mg by mouth daily as needed (ED).    Yes [provider]  telmisartan (MICARDIS) 40 MG tablet Take 40 mg by mouth at bedtime.  10/20/15  Yes [provider]  Testosterone 20 % CREA Apply 1 application topically daily.    Yes [provider]  vitamin C (ASCORBIC ACID) 500 MG tablet Take 500 mg by mouth daily.   Yes [provider]  clindamycin (CLEOCIN) 300 MG capsule TAKE 2 CAPSULES BY MOUTH DAILY *take 30 minutes before appointment* Patient taking differently: Take 600 mg by mouth See admin instructions. FOR DENTAL PROCEDURES 08/24/18   Richardo Priest, MD     Allergies:    Allergies  Allergen Reactions  . Penicillins Rash and  bruits; FA pulses 2+ bilaterally without bruits  Cardiac:  normal S1, S2; RRR; systolic murmur Lungs:  clear to auscultation bilaterally, no wheezing, rhonchi or rales  Abd: soft, nontender, no hepatomegaly  Ext: no LE edema Musculoskeletal:  No deformities, BUE and BLE strength normal and equal Skin: warm and dry  Neuro:  CNs 2-12 intact, no focal abnormalities noted Psych:  Normal affect   Relevant CV Studies:  ECHOCARDIOGRAM REPORT   Patient Name: Aaron Foley Date of Exam: 09/27/2018 Medical Rec #: [id number] Height: 71.0 in Accession #: [id number] Weight: 223.4 lb Date of Birth: 08-Apr-1941 BSA: 2.21 m Patient Age: 49 years BP: 134/84 mmHg Patient Gender: M HR: 64 bpm. Exam Location: [place]   Procedure: 2D Echo  Indications: Aortic Stenosis 424.1 / 135.0  History: Patient has prior history of Echocardiogram examinations, most recent 07/21/2017. Signs/Symptoms: Chest Pain; Risk Factors: Hypertension and Dyslipidemia.  Sonographer: Luane School Referring Phys: [phone] [place]   1. The left ventricle has normal systolic function, with an ejection fraction of 55-60%. The cavity size was normal. There is moderate concentric left ventricular hypertrophy. Left ventricular diastolic Doppler parameters are consistent with impaired  relaxation Elevated left ventricular end-diastolic pressure No evidence of left ventricular regional wall motion abnormalities. 2. The right  ventricle has normal systolic function. The cavity was normal. There is no increase in right ventricular wall thickness. 3. Left atrial size was mildly dilated. 4. The mitral valve is degenerative. There is mild to moderate mitral annular calcification present. 5. The aortic valve is tricuspid Severely thickening of the aortic valve Mild calcification of the aortic valve. Aortic valve regurgitation is mild by color flow Doppler. severe stenosis of the aortic valve. 6. The aortic root, ascending aorta and aortic arch are normal in size and structure. 7. The inferior vena cava was dilated in size with >50% respiratory variability. 8. No evidence of left ventricular regional wall motion abnormalities.  FINDINGS Left Ventricle: The left ventricle has normal systolic function, with an ejection fraction of 55-60%. The cavity size was normal. There is moderate concentric left ventricular hypertrophy. Left ventricular diastolic Doppler parameters are consistent  with impaired relaxation Elevated left ventricular end-diastolic pressure No evidence of left ventricular regional wall motion abnormalities.. Right Ventricle: The right ventricle has normal systolic function. The cavity was normal. There is no increase in right ventricular wall thickness. Left Atrium: left atrial size was mildly dilated Right Atrium: right atrial size was normal in size. Right atrial pressure is estimated at 8 mmHg. Interatrial Septum: No atrial level shunt detected by color flow Doppler. Pericardium: There is no evidence of pericardial effusion. Mitral Valve: The mitral valve is degenerative in appearance. There is mild to moderate mitral annular calcification present. Mitral valve regurgitation is not visualized by color flow Doppler. Tricuspid Valve: The tricuspid valve is normal in structure. Tricuspid valve regurgitation was not visualized by color flow Doppler. Aortic Valve: The aortic valve is tricuspid Severely  thickening of the aortic valve Mild calcification of the aortic valve, with severely decreased cusp excursion. Aortic valve regurgitation is mild by color flow Doppler. There is severe stenosis of the  aortic valve, with a calculated valve area of 0.70 cm. Pulmonic Valve: The pulmonic valve was normal in structure. Pulmonic valve regurgitation is not visualized by color flow Doppler. No evidence of pulmonic stenosis. Aorta: The aortic root, ascending aorta and aortic arch are normal in size and structure. Pulmonary Artery: The pulmonary artery is of normal size and  bruits; FA pulses 2+ bilaterally without bruits  Cardiac:  normal S1, S2; RRR; systolic murmur Lungs:  clear to auscultation bilaterally, no wheezing, rhonchi or rales  Abd: soft, nontender, no hepatomegaly  Ext: no LE edema Musculoskeletal:  No deformities, BUE and BLE strength normal and equal Skin: warm and dry  Neuro:  CNs 2-12 intact, no focal abnormalities noted Psych:  Normal affect   Relevant CV Studies:  ECHOCARDIOGRAM REPORT   Patient Name: Aaron Foley Date of Exam: 09/27/2018 Medical Rec #: [id number] Height: 71.0 in Accession #: [id number] Weight: 223.4 lb Date of Birth: 08-Apr-1941 BSA: 2.21 m Patient Age: 49 years BP: 134/84 mmHg Patient Gender: M HR: 64 bpm. Exam Location: [facility]   Procedure: 2D Echo  Indications: Aortic Stenosis 424.1 / 135.0  History: Patient has prior history of Echocardiogram examinations, most recent 07/21/2017. Signs/Symptoms: Chest Pain; Risk Factors: Hypertension and Dyslipidemia.  Sonographer: Luane School Referring Phys: [phone] [place]   1. The left ventricle has normal systolic function, with an ejection fraction of 55-60%. The cavity size was normal. There is moderate concentric left ventricular hypertrophy. Left ventricular diastolic Doppler parameters are consistent with impaired  relaxation Elevated left ventricular end-diastolic pressure No evidence of left ventricular regional wall motion abnormalities. 2. The right  ventricle has normal systolic function. The cavity was normal. There is no increase in right ventricular wall thickness. 3. Left atrial size was mildly dilated. 4. The mitral valve is degenerative. There is mild to moderate mitral annular calcification present. 5. The aortic valve is tricuspid Severely thickening of the aortic valve Mild calcification of the aortic valve. Aortic valve regurgitation is mild by color flow Doppler. severe stenosis of the aortic valve. 6. The aortic root, ascending aorta and aortic arch are normal in size and structure. 7. The inferior vena cava was dilated in size with >50% respiratory variability. 8. No evidence of left ventricular regional wall motion abnormalities.  FINDINGS Left Ventricle: The left ventricle has normal systolic function, with an ejection fraction of 55-60%. The cavity size was normal. There is moderate concentric left ventricular hypertrophy. Left ventricular diastolic Doppler parameters are consistent  with impaired relaxation Elevated left ventricular end-diastolic pressure No evidence of left ventricular regional wall motion abnormalities.. Right Ventricle: The right ventricle has normal systolic function. The cavity was normal. There is no increase in right ventricular wall thickness. Left Atrium: left atrial size was mildly dilated Right Atrium: right atrial size was normal in size. Right atrial pressure is estimated at 8 mmHg. Interatrial Septum: No atrial level shunt detected by color flow Doppler. Pericardium: There is no evidence of pericardial effusion. Mitral Valve: The mitral valve is degenerative in appearance. There is mild to moderate mitral annular calcification present. Mitral valve regurgitation is not visualized by color flow Doppler. Tricuspid Valve: The tricuspid valve is normal in structure. Tricuspid valve regurgitation was not visualized by color flow Doppler. Aortic Valve: The aortic valve is tricuspid Severely  thickening of the aortic valve Mild calcification of the aortic valve, with severely decreased cusp excursion. Aortic valve regurgitation is mild by color flow Doppler. There is severe stenosis of the  aortic valve, with a calculated valve area of 0.70 cm. Pulmonic Valve: The pulmonic valve was normal in structure. Pulmonic valve regurgitation is not visualized by color flow Doppler. No evidence of pulmonic stenosis. Aorta: The aortic root, ascending aorta and aortic arch are normal in size and structure. Pulmonary Artery: The pulmonary artery is of normal size and

## 2018-11-21 NOTE — Transfer of Care (Signed)
Immediate Anesthesia Transfer of Care Note  Patient: Aaron Foley  Procedure(s) Performed: TRANSCATHETER AORTIC VALVE REPLACEMENT, TRANSFEMORAL (N/A Chest) Intraoperative Transthoracic Echocardiogram (N/A )  Patient Location: Cath Lab  Anesthesia Type:MAC  Level of Consciousness: awake, alert  and oriented  Airway & Oxygen Therapy: Patient Spontanous Breathing and Patient connected to nasal cannula oxygen  Post-op Assessment: Report given to RN, Post -op Vital signs reviewed and stable and Patient moving all extremities  Post vital signs: Reviewed and stable  Last Vitals:  Vitals Value Taken Time  BP 92/53 11/21/2018  9:41 AM  Temp    Pulse 59 11/21/2018  9:44 AM  Resp 11 11/21/2018  9:44 AM  SpO2 97 % 11/21/2018  9:44 AM  Vitals shown include unvalidated device data.  Last Pain:  Vitals:   11/21/18 0620  TempSrc:   PainSc: 0-No pain      Patients Stated Pain Goal: 3 (00/86/76 1950)  Complications: No apparent anesthesia complications

## 2018-11-21 NOTE — Anesthesia Postprocedure Evaluation (Signed)
Anesthesia Post Note  Patient: Aaron Foley  Procedure(s) Performed: TRANSCATHETER AORTIC VALVE REPLACEMENT, TRANSFEMORAL (N/A Chest) Intraoperative Transthoracic Echocardiogram (N/A )     Patient location during evaluation: PACU Anesthesia Type: General Level of consciousness: awake Pain management: pain level controlled Vital Signs Assessment: post-procedure vital signs reviewed and stable Respiratory status: spontaneous breathing Cardiovascular status: stable Postop Assessment: no apparent nausea or vomiting Anesthetic complications: no    Last Vitals:  Vitals:   11/21/18 1319 11/21/18 1400  BP: (!) 103/53 113/65  Pulse: 60 63  Resp: 12 15  Temp:  (!) 36.4 C  SpO2:  97%    Last Pain:  Vitals:   11/21/18 1400  TempSrc: Oral  PainSc: 0-No pain                 Marixa Mellott

## 2018-11-21 NOTE — Interval H&P Note (Signed)
History and Physical Interval Note:  11/21/2018 7:19 AM  Aaron Foley  has presented today for surgery, with the diagnosis of Severe Aortic Stenosis.  The various methods of treatment have been discussed with the patient and family. After consideration of risks, benefits and other options for treatment, the patient has consented to  Procedure(s): TRANSCATHETER AORTIC VALVE REPLACEMENT, TRANSFEMORAL (N/A) TRANSESOPHAGEAL ECHOCARDIOGRAM (TEE) (N/A) as a surgical intervention.  The patient's history has been reviewed, patient examined, no change in status, stable for surgery.  I have reviewed the patient's chart and labs.  Questions were answered to the patient's satisfaction.     [name]

## 2018-11-21 NOTE — Op Note (Addendum)
[facility]   MULTIDISCIPLINARY HEART VALVE TEAM   TAVR OPERATIVE NOTE   Date of Procedure:  11/21/2018  Preoperative Diagnosis: Severe Aortic Stenosis   Postoperative Diagnosis: Same   Procedure:    Transcatheter Aortic Valve Replacement - Percutaneous Right Transfemoral Approach  Edwards Sapien 3 THV (size 26 mm, model # 9600TFX, serial # [id number])   Co-Surgeons:  Valentina Gu. Roxy Manns, MD and Lauree Chandler, MD  Anesthesiologist:  Belinda Block, MD  Echocardiographer:  Ena Dawley, MD  Pre-operative Echo Findings:  Severe aortic stenosis  Normal left ventricular systolic function  Post-operative Echo Findings:  No paravalvular leak  Normal left ventricular systolic function   BRIEF CLINICAL NOTE AND INDICATIONS FOR SURGERY  Patient is a 78 year old male with history of aortic stenosis, hypertension, hypercholesterolemia, and hypothyroidism who has been referred for surgical consultation to discuss treatment options for management of severe symptomatic aortic stenosis.  Patient has remained physically active and relatively healthy all of his adult life.  Approximately 6 years ago he was noted to have a heart murmur on physical exam by his primary care physician.  He was diagnosed with aortic stenosis based on transthoracic echocardiogram and referred to Dr. Bettina Gavia who has been following him carefully for the past several years.  Follow-up echocardiograms have documented gradual progression of aortic stenosis with preserved left ventricular systolic function.  Most recent follow-up echocardiogram was performed September 27, 2018 and revealed severe aortic stenosis with peak velocity across aortic valve measured 4.4 m/s corresponding to mean transvalvular gradient estimated 49 mmHg and aortic valve area calculated 0.70 cm.  Left ventricular systolic function remain normal.  The patient was referred to the multidisciplinary heart valve clinic and  underwent diagnostic cardiac catheterization by Dr. Angelena Form on October 12, 2018.  Catheterization revealed mild nonobstructive coronary artery disease and confirmed the presence of severe aortic stenosis with peak to peak and mean transvalvular gradients measured 48 and 40.8 mmHg, respectively.  Right heart pressures were normal.    The patient is married and lives with his wife in [place].  He is retired from the as per police department.  He has remained physically active throughout retirement.  Both he and his wife enjoy hiking and kayaking.  Beginning last summer the patient began to experience intermittent dizzy spells, exertional chest tightness, and exertional shortness of breath.  The patient was referred for surgical consultation several weeks ago but initially his consultation was postponed because of the ongoing COVID-19 pandemic.  However, symptoms have accelerated considerably over the past 6 weeks.  The patient reports frequent dizzy spells that can occur at anytime.  He has not had a syncopal episode.  He now gets chest tightness with moderate level activity.  He gets shortness of breath with moderate level activity.  Several days ago he was awoke from sleep with chest tightness that took 10 to 15 minutes to gradually resolve.  He has not had resting shortness of breath, PND, orthopnea, or lower extremity edema.  He telephoned Dr. Joya Gaskins office and a decision was made to accelerate his referral for definitive surgical intervention.  During the course of the patient's preoperative work up they have been evaluated comprehensively by a multidisciplinary team of specialists coordinated through the [facility] in the [facility].  They have been demonstrated to suffer from symptomatic severe aortic stenosis as noted above. The patient has been counseled extensively as to the relative risks and benefits of all options for the  treatment of severe  aortic stenosis including long term medical therapy, conventional surgery for aortic valve replacement, and transcatheter aortic valve replacement.  All questions have been answered, and the patient provides full informed consent for the operation as described.   DETAILS OF THE OPERATIVE PROCEDURE  PREPARATION:    The patient is brought to the operating room on the above mentioned date and monitoring was established by the anesthesia team. The patient is placed in the supine position on the operating table.  Intravenous antibiotics are administered. The patient is monitored closely throughout the procedure under conscious sedation.  Baseline transthoracic echocardiogram was performed. The patient's chest, abdomen, both groins, and both lower extremities are prepared and draped in a sterile manner. A time out procedure is performed.   PERIPHERAL ACCESS:    Using the modified Seldinger technique, femoral arterial and venous access was obtained with placement of 6 Fr sheaths on the left side.  A pigtail diagnostic catheter was passed through the left arterial sheath under fluoroscopic guidance into the aortic root.  A temporary transvenous pacemaker catheter was passed through the left femoral venous sheath under fluoroscopic guidance into the right ventricle.  The pacemaker was tested to ensure stable lead placement and pacemaker capture. Aortic root angiography was performed in order to determine the optimal angiographic angle for valve deployment.   TRANSFEMORAL ACCESS:   Percutaneous transfemoral access and sheath placement was performed using ultrasound guidance.  The right common femoral artery was cannulated using a micropuncture needle and appropriate location was verified using hand injection angiogram.  A pair of Abbott Perclose percutaneous closure devices were placed and a 6 French sheath replaced into the femoral artery.  The patient was heparinized systemically and ACT verified > 250  seconds.    A 14 Fr transfemoral E-sheath was introduced into the right common femoral artery after progressively dilating over an Amplatz superstiff wire. An AL-2 catheter was used to direct a straight-tip exchange length wire across the native aortic valve into the left ventricle. This was exchanged out for a pigtail catheter and position was confirmed in the LV apex. Simultaneous LV and Ao pressures were recorded.  The pigtail catheter was exchanged for an Amplatz Extra-stiff wire in the LV apex.  Echocardiography was utilized to confirm appropriate wire position and no sign of entanglement in the mitral subvalvular apparatus.   TRANSCATHETER HEART VALVE DEPLOYMENT:   An Edwards Sapien 3 transcatheter heart valve (size 26 mm, model #9600TFX, serial #[id number]) was prepared and crimped per manufacturer's guidelines, and the proper orientation of the valve is confirmed on the [employer] delivery system. The valve was advanced through the introducer sheath using normal technique until in an appropriate position in the abdominal aorta beyond the sheath tip. The balloon was then retracted and using the fine-tuning wheel was centered on the valve. The valve was then advanced across the aortic arch using appropriate flexion of the catheter. The valve was carefully positioned across the aortic valve annulus. The Commander catheter was retracted using normal technique. Once final position of the valve has been confirmed by angiographic assessment, the valve is deployed while temporarily holding ventilation and during rapid ventricular pacing to maintain systolic blood pressure < 50 mmHg and pulse pressure < 10 mmHg. The balloon inflation is held for >3 seconds after reaching full deployment volume. Once the balloon has fully deflated the balloon is retracted into the ascending aorta and valve function is assessed using echocardiography. There is felt to be no paravalvular leak  and no central aortic  insufficiency.  The patient's hemodynamic recovery following valve deployment is good.  The deployment balloon and guidewire are both removed.    PROCEDURE COMPLETION:   The sheath was removed and femoral artery closure performed.  Protamine was administered once femoral arterial repair was complete. The temporary pacemaker, pigtail catheters and femoral sheaths were removed with manual pressure used for hemostasis. A Mynx closure device was used for removal of the left femoral artery sheath.  The patient tolerated the procedure well and is transported to the surgical intensive care in stable condition. There were no immediate intraoperative complications. All sponge instrument and needle counts are verified correct at completion of the operation.   No blood products were administered during the operation.  The patient received a total of 42 mL of intravenous contrast during the procedure.   [name], MD 11/21/2018 9:01 AM

## 2018-11-22 ENCOUNTER — Inpatient Hospital Stay (HOSPITAL_COMMUNITY): Payer: Medicare HMO

## 2018-11-22 ENCOUNTER — Encounter (HOSPITAL_COMMUNITY): Payer: Self-pay | Admitting: Cardiovascular Disease

## 2018-11-22 DIAGNOSIS — Z952 Presence of prosthetic heart valve: Secondary | ICD-10-CM

## 2018-11-22 LAB — ECHOCARDIOGRAM COMPLETE
Height: 72 in
Weight: 3548.52 oz

## 2018-11-22 LAB — BASIC METABOLIC PANEL
Anion gap: 8 (ref 5–15)
BUN: 17 mg/dL (ref 8–23)
CO2: 29 mmol/L (ref 22–32)
Calcium: 9.2 mg/dL (ref 8.9–10.3)
Chloride: 99 mmol/L (ref 98–111)
Creatinine, Ser: 1.25 mg/dL — ABNORMAL HIGH (ref 0.61–1.24)
GFR calc Af Amer: >60 mL/min (ref >60)
GFR calc non Af Amer: 56 mL/min — ABNORMAL LOW (ref >60)
Glucose, Bld: 118 mg/dL — ABNORMAL HIGH (ref 70–99)
Potassium: 4.6 mmol/L (ref 3.5–5.1)
Sodium: 136 mmol/L (ref 135–145)

## 2018-11-22 LAB — CBC
HCT: 46.7 % (ref 39.0–52.0)
Hemoglobin: 16.4 g/dL (ref 13.0–17.0)
MCH: 33.2 pg (ref 26.0–34.0)
MCHC: 35.1 g/dL (ref 30.0–36.0)
MCV: 94.5 fL (ref 80.0–100.0)
Platelets: 164 10*3/uL (ref 150–400)
RBC: 4.94 MIL/uL (ref 4.22–5.81)
RDW: 11.5 % (ref 11.5–15.5)
WBC: 9.5 10*3/uL (ref 4.0–10.5)
nRBC: 0 % (ref 0.0–0.2)

## 2018-11-22 LAB — MAGNESIUM: Magnesium: 1.8 mg/dL (ref 1.7–2.4)

## 2018-11-22 MED ORDER — ASPIRIN 81 MG PO CHEW: 81.0000 mg | CHEWABLE_TABLET | Freq: Every day | ORAL | Status: DC

## 2018-11-22 MED ORDER — PERFLUTREN LIPID MICROSPHERE
1.0000 mL | INTRAVENOUS | Status: AC | PRN
  Administered 2018-11-22: 4 mL via INTRAVENOUS
  Filled 2018-11-22: qty 10

## 2018-11-22 MED ORDER — CLOPIDOGREL BISULFATE 75 MG PO TABS: 75.0000 mg | ORAL_TABLET | Freq: Every day | ORAL | 1 refills | Status: DC

## 2018-11-22 NOTE — Progress Notes (Signed)
Cardiac Rehab Advisory Cardiac Rehab Phase I is not seeing pts face to face at this time due to Covid 19 restrictions. Ambulation is occurring through nursing, PT, and mobility teams. We will help facilitate that process as needed. We are calling pts in their rooms and discussing education. We will then deliver education materials to pts RN for delivery to pt.   [phone] Read nurse's note that pt walked this morning 800 ft without device. Pt stated he tolerated well. Discussed walking for ex. Offered CRP 2 [place] but pt would prefer to ex on his own. Tries to remain very active. Will give heart healthy diet for reference. Pt voiced understanding. Graylon Good RN BSN 11/22/2018 8:01 AM

## 2018-11-22 NOTE — Progress Notes (Signed)
PT provided discharge instructions and educated. IV's removed and intact. Vitals stable. Pt denies any complaints. PT has all belongings. Volunteers called to tx pt via wheelchair to [place].  Jerald Kief, RN

## 2018-11-22 NOTE — Progress Notes (Signed)
Pt ambulated 800 feet in hallways without any supportive device. Tolerated well. Vitals stable. Will continue to monitor.

## 2018-11-22 NOTE — Progress Notes (Signed)
Pt ambulated 800 feet in hallways without any supportive device. Tolerated well. Will continue to monitor.

## 2018-11-22 NOTE — Progress Notes (Signed)
  Echocardiogram 2D Echocardiogram has been performed.  Aaron Foley 11/22/2018, 10:38 AM

## 2018-11-22 NOTE — Discharge Summary (Signed)
[facility]   MULTIDISCIPLINARY HEART VALVE TEAM  Discharge Summary    Patient ID: Aaron Foley MRN: [id number]; DOB: [birth year]  Admit date: 11/21/2018 Discharge date: 11/22/2018  Primary Care Provider: Philemon Kingdom, MD  Primary Cardiologist: Norman Herrlich, MD / Dr. Clifton James & Dr. Cornelius Moras (TAVR)  Discharge Diagnoses    Principal Problem:   S/P TAVR (transcatheter aortic valve replacement) Active Problems:   Essential hypertension   Hypothyroidism   Hypercholesterolemia   Low testosterone in male   Severe aortic stenosis   Allergies Allergies  Allergen Reactions  . Penicillins Rash and Other (See Comments)    Did it involve swelling of the face/tongue/throat, SOB, or low BP? No Did it involve sudden or SEVERE rash/hives, skin peeling, or any reaction on the inside of your mouth or nose? #  #  #  YES  #  #  #  Did you need to seek medical attention at a hospital or doctor's office? #  #  #  YES  #  #  #  When did it last happen?Long time ago      Diagnostic Studies/Procedures     TAVR OPERATIVE NOTE   Date of Procedure:                11/21/2018  Preoperative Diagnosis:      Severe Aortic Stenosis   Postoperative Diagnosis:    Same   Procedure:        Transcatheter Aortic Valve Replacement - Percutaneous Right Transfemoral Approach             Edwards Sapien 3 THV (size 26 mm, model # 9600TFX, serial # [id number])              Co-Surgeons:                        Salvatore Decent. Cornelius Moras, MD and Verne Carrow, MD  Anesthesiologist:                  Dorris Singh, MD  Echocardiographer:              Tobias Alexander, MD  Pre-operative Echo Findings: ? Severe aortic stenosis ? Normal left ventricular systolic function  Post-operative Echo Findings: ? No paravalvular leak ? Normal left ventricular systolic function  _____________   Echo 11/22/2018: pending formal read at the time of discharge.   History of Present  Illness     Aaron Foley is a 78 y.o. male with a history of HTN, HLD, hypothyroidism, and severe AS who presented to [facility] on 11/21/18 for planned TAVR.   Patient has remained physically active and relatively healthy all of his adult life. Approximately 6 years ago he was noted to have a heart murmur on physical exam by his primary care physician. He was diagnosed with aortic stenosis based on transthoracic echocardiogram and referred to Dr. Dulce Sellar who has been following him carefully for the past several years. Follow-up echocardiograms have documented gradual progression of aortic stenosis with preserved left ventricular systolic function. Most recent follow-up echocardiogram was performed September 27, 2018 and revealed severe aortic stenosis with peak velocity across aortic valve measured 4.4 m/s corresponding to mean transvalvular gradient estimated 49 mmHg and aortic valve area calculated 0.70 cm. Left ventricular systolic function remain normal. The patient was referred to the multidisciplinary heart valve clinic and underwent diagnostic cardiac catheterization by Dr. Clifton James on October 12, 2018. Catheterization revealed mild nonobstructive coronary artery  disease and confirmed the presence of severe aortic stenosis with peak to peak and mean transvalvular gradients measured 48 and 40.8 mmHg, respectively. Right heart pressures were normal.   The patient has been evaluated by the multidisciplinary valve team and felt to have severe, symptomatic aortic stenosis and to be a suitable candidate for TAVR, which was set up for 11/21/18.  Hospital Course     Consultants: none  Severe AS: s/p successful TAVR with a 26 mm Edwards Sapien 3 THV via the TF approach on 11/21/18. Post operative echo completed but pending formal read. Groin sites are stable. ECG with sinus and no high grade heart block. Continue Asprin and plavix. I will see him next week for a structural heart virtual visit.    HTN: BP well controlled. Creat with mild bump today (0.79--> 1.25) . Resume home ARB tomorrow.   Hypothyroidism: continue synthroid.  _____________  Discharge Vitals Blood pressure (!) 134/53, pulse 74, temperature 98.4 F (36.9 C), temperature source Oral, resp. rate (!) 21, height 6' (1.829 m), weight 100.6 kg, SpO2 95 %.  Filed Weights   11/21/18 0603 11/22/18 0650  Weight: 98.8 kg 100.6 kg   VS:  BP (!) 134/53 (BP Location: Right Arm)   Pulse 74   Temp 98.4 F (36.9 C) (Oral)   Resp (!) 21   Ht 6' (1.829 m)   Wt 100.6 kg   SpO2 95%   BMI 30.08 kg/m    GEN: Well nourished, well developed, in no acute distress, tan  HEENT: normal Neck: no JVD or masses Cardiac: RRR; soft flow murmur. No rubs, or gallops,no edema  Respiratory:  clear to auscultation bilaterally, normal work of breathing GI: soft, nontender, nondistended, + BS MS: no deformity or atrophy Skin: warm and dry, no rash  Groin sites clear without hematoma or ecchymosis  Neuro:  Alert and Oriented x 3, Strength and sensation are intact Psych: euthymic mood, full affect   Labs & Radiologic Studies    CBC Recent Labs    11/21/18 1038 11/22/18 0321  WBC  --  9.5  HGB 5.4* 16.4  HCT 16.0* 46.7  MCV  --  94.5  PLT  --  164   Basic Metabolic Panel Recent Labs    60/45/40 0841 11/21/18 1038 11/21/18 1039 11/22/18 0321  NA 137 129*  --  136  K 4.4 4.0  --  4.6  CL  --   --   --  99  CO2  --   --   --  29  GLUCOSE 120*  --   --  118*  BUN  --   --   --  17  CREATININE  --   --  <0.20* 1.25*  CALCIUM  --   --   --  9.2  MG  --   --   --  1.8   Liver Function Tests No results for input(s): AST, ALT, ALKPHOS, BILITOT, PROT, ALBUMIN in the last 72 hours. No results for input(s): LIPASE, AMYLASE in the last 72 hours. Cardiac Enzymes No results for input(s): CKTOTAL, CKMB, CKMBINDEX, TROPONINI in the last 72 hours. BNP Invalid input(s): POCBNP D-Dimer No results for input(s): DDIMER in the last 72  hours. Hemoglobin A1C No results for input(s): HGBA1C in the last 72 hours. Fasting Lipid Panel No results for input(s): CHOL, HDL, LDLCALC, TRIG, CHOLHDL, LDLDIRECT in the last 72 hours. Thyroid Function Tests No results for input(s): TSH, T4TOTAL, T3FREE, THYROIDAB in the last 72 hours.  Invalid input(s): FREET3 _____________  Dg Chest 2 View  Result Date: 11/17/2018 CLINICAL DATA:  Mitral valve replacement preop. EXAM: CHEST - 2 VIEW COMPARISON:  09/28/2018 FINDINGS: The heart is moderately enlarged. Lungs are under aerated with bibasilar atelectasis. Vascular congestion. No Kerley B lines to suggest interstitial edema. No pneumothorax or pleural effusion. IMPRESSION: Cardiomegaly and vascular congestion without pulmonary edema. Electronically Signed   By: Jolaine Click M.D.   On: 11/17/2018 15:21   Dg Chest Port 1 View  Result Date: 11/21/2018 CLINICAL DATA:  Status post transcatheter aortic valve replacement. EXAM: PORTABLE CHEST 1 VIEW COMPARISON:  Radiographs of November 17, 2018. FINDINGS: Stable cardiomegaly. Status post transcatheter aortic valve replacement. No pneumothorax or pleural effusion is noted. No acute pulmonary disease is noted. Bony thorax is unremarkable. IMPRESSION: No active disease. Electronically Signed   By: Lupita Raider M.D.   On: 11/21/2018 15:00   Disposition   Pt is being discharged home today in good condition.  Follow-up Plans & Appointments    Follow-up Information    Janetta Hora, PA-C Follow up on 11/29/2018.   Specialties:  Cardiology, Radiology Why:  You have a virtial visit at 12 noon on 4/29. Please see discharge instructions for telehealth consent and how to prepare for your visit Contact information: [address] [place] [address] [phone]            Discharge Medications   Allergies as of 11/22/2018      Reactions   Penicillins Rash, Other (See Comments)   Did it involve swelling of the  face/tongue/throat, SOB, or low BP? No Did it involve sudden or SEVERE rash/hives, skin peeling, or any reaction on the inside of your mouth or nose? #  #  #  YES  #  #  #  Did you need to seek medical attention at a hospital or doctor's office? #  #  #  YES  #  #  #  When did it last happen?Long time ago       Medication List    TAKE these medications   anastrozole 1 MG tablet Commonly known as:  ARIMIDEX Take 0.25 mg by mouth 2 (two) times a week. Pt states he is taking 1/4 of a tablet 2 times a week   Armour Thyroid 30 MG tablet Generic drug:  thyroid Take 30 mg by mouth daily before breakfast.   aspirin 81 MG chewable tablet Chew 1 tablet (81 mg total) by mouth daily.   cholecalciferol 25 MCG (1000 UT) tablet Commonly known as:  VITAMIN D3 Take 1,000 Units by mouth daily.   clindamycin 300 MG capsule Commonly known as:  CLEOCIN TAKE 2 CAPSULES BY MOUTH DAILY *take 30 minutes before appointment* What changed:  See the new instructions.   clopidogrel 75 MG tablet Commonly known as:  PLAVIX Take 1 tablet (75 mg total) by mouth daily with breakfast. Start taking on:  November 23, 2018   JOINT HEALTH PO Take 1 capsule by mouth daily.   Melatonin 1 MG Tabs Take 0.5 tablets by mouth at bedtime.   multivitamin with minerals tablet Take 1 tablet by mouth daily.   sildenafil 20 MG tablet Commonly known as:  REVATIO Take 20 mg by mouth daily as needed (ED).   telmisartan 40 MG tablet Commonly known as:  MICARDIS Take 40 mg by mouth at bedtime. Notes to patient:  Hold until 11/23/18.   Testosterone 20 % Crea Apply 1 application  topically daily.   vitamin C 500 MG tablet Commonly known as:  ASCORBIC ACID Take 500 mg by mouth daily.   VITAMIN K2 PO Take 1 tablet by mouth daily.        Outstanding Labs/Studies   none  Duration of Discharge Encounter   Greater than 30 minutes including physician time.  SignedCline Crock, PA-C 11/22/2018, 10:35 AM  [phone]

## 2018-11-23 ENCOUNTER — Telehealth: Payer: Self-pay | Admitting: Physician Assistant

## 2018-11-23 NOTE — Telephone Encounter (Signed)
  [facility] VALVE TEAM   Patient contacted regarding discharge from [facility] on 11/22/18  Patient understands to follow up with provider Nell Range on 4/29 for a virtual visit Patient understands discharge instructions? yes Patient understands medications and regiment? yes Patient understands to bring all medications to this visit? yes  Angelena Form PA-C  [facility]

## 2018-11-29 ENCOUNTER — Telehealth (INDEPENDENT_AMBULATORY_CARE_PROVIDER_SITE_OTHER): Payer: Medicare HMO | Admitting: Physician Assistant

## 2018-11-29 ENCOUNTER — Other Ambulatory Visit: Payer: Self-pay

## 2018-11-29 VITALS — BP 130/70 | HR 60 | Wt 218.0 lb

## 2018-11-29 DIAGNOSIS — Z952 Presence of prosthetic heart valve: Secondary | ICD-10-CM | POA: Diagnosis not present

## 2018-11-29 DIAGNOSIS — E039 Hypothyroidism, unspecified: Secondary | ICD-10-CM

## 2018-11-29 DIAGNOSIS — I10 Essential (primary) hypertension: Secondary | ICD-10-CM

## 2018-11-29 DIAGNOSIS — Z7189 Other specified counseling: Secondary | ICD-10-CM

## 2018-11-29 NOTE — Progress Notes (Signed)
HEART AND VASCULAR CENTER   MULTIDISCIPLINARY HEART VALVE TEAM     Virtual Visit via Video Note   This visit type was conducted due to national recommendations for restrictions regarding the COVID-19 Pandemic (e.g. social distancing) in an effort to limit this patient's exposure and mitigate transmission in our community.  Due to his co-morbid illnesses, this patient is at least at moderate risk for complications without adequate follow up.  This format is felt to be most appropriate for this patient at this time.  All issues noted in this document were discussed and addressed.  A limited physical exam was performed with this format.  Please refer to the patient's chart for his consent to telehealth for [facility].   Evaluation Performed:  Follow-up visit  Date:  11/29/2018   ID:  Aaron Foley, DOB 26-May-1941, MRN [id number]  Patient Location: Home Provider Location: Office  PCP:  Philemon Kingdom, MD  Cardiologist:  Norman Herrlich, MD / Dr. Clifton James & Dr. Cornelius Moras (TAVR)  Chief Complaint:  [facility] s/p TAVR  History of Present Illness:    Aaron Foley is a 78 y.o. male with HTN, HLD, hypothyroidism, and severe AS s/p TAVR (11/21/18) who presents for follow up.   The patient does not have symptoms concerning for COVID-19 infection (fever, chills, cough, or new shortness of breath).   Patient has remained physically active and relatively healthy all of his adult life. Approximately 6 years ago he was noted to have a heart murmur on physical exam by his primary care physician. He was diagnosed with aortic stenosis based on transthoracic echocardiogram and referred to Dr. Dulce Sellar who has been following him carefully for the past several years. Follow-up echocardiograms have documented gradual progression of aortic stenosis with preserved left ventricular systolic function. Most recent follow-up echocardiogram was performed September 27, 2018 and revealed severe aortic stenosis with  peak velocity across aortic valve measured 4.4 m/s corresponding to mean transvalvular gradient estimated 49 mmHg and aortic valve area calculated 0.70 cm. Left ventricular systolic function remain normal. The patient was referred to the multidisciplinary heart valve clinic and underwent diagnostic cardiac catheterization by Dr. Clifton James on October 12, 2018. Catheterization revealed mild nonobstructive coronary artery disease and confirmed the presence of severe aortic stenosis with peak to peak and mean transvalvular gradients measured 48 and 40.8 mmHg, respectively. Right heart pressures were normal.   He was evaluated by the multidisciplinary valve team and felt to have severe, symptomatic aortic stenosis. He underwent successful TAVR with a 26 mm Edwards Sapien 3 THV via the TF approach on 11/21/18. Post operative echo showed EF 65%, poorly visualized TAVR with elevated mean gradient of 24 mm Hg and no PVL. He was discharged on POD1 with aspirin and plavix.   Today he presents for a telehealth visit. No CP or SOB. No LE edema, orthopnea or PND. No dizziness or syncope. No blood in stool or urine. No palpitations. He feels much better in terms of his breathing. Has been out walking everyday with his wife.    Past Medical History:  Diagnosis Date   Closed left ankle fracture 2014   Disc disease, degenerative, cervical    Diverticulosis    mild left colonic   Hypercholesterolemia 08/10/2017   Hypertension 08/10/2017   Hypothyroidism 08/10/2017   Low testosterone in male 08/10/2017   Low vitamin B12 level 08/10/2017   Mild aortic stenosis 08/10/2017   Penis disorder 08/10/2017   Perione's disease/penile fibrosis    S/P TAVR (transcatheter  aortic valve replacement)    26 mm Edwards Sapien 3 transcatheter heart valve placed via percutaneous right transfemoral approach    Severe aortic stenosis    Skin cancer    on face/forehead basil cell   Vitamin D deficiency 08/10/2017   Wears glasses      readers   Past Surgical History:  Procedure Laterality Date   COLONOSCOPY     INTRAOPERATIVE TRANSTHORACIC ECHOCARDIOGRAM N/A 11/21/2018   Procedure: Intraoperative Transthoracic Echocardiogram;  Surgeon: Kathleene Hazel, MD;  Location: [facility] OR;  Service: Open Heart Surgery;  Laterality: N/A;   MENISCUS REPAIR Left    RIGHT/LEFT HEART CATH AND CORONARY ANGIOGRAPHY N/A 10/12/2018   Procedure: RIGHT/LEFT HEART CATH AND CORONARY ANGIOGRAPHY;  Surgeon: Kathleene Hazel, MD;  Location: [facility] INVASIVE CV LAB;  Service: Cardiovascular;  Laterality: N/A;   TRANSCATHETER AORTIC VALVE REPLACEMENT, TRANSFEMORAL N/A 11/21/2018   Procedure: TRANSCATHETER AORTIC VALVE REPLACEMENT, TRANSFEMORAL;  Surgeon: Kathleene Hazel, MD;  Location: [facility] OR;  Service: Open Heart Surgery;  Laterality: N/A;   UMBILICAL HERNIA REPAIR       No outpatient medications have been marked as taking for the 11/29/18 encounter (Telemedicine) with Janetta Hora, PA-C.     Allergies:   Penicillins   Social History   Tobacco Use   Smoking status: Former Smoker    Packs/day: 1.00    Years: 10.00    Pack years: 10.00    Types: Cigarettes    Last attempt to quit: 1969    Years since quitting: 51.3   Smokeless tobacco: Never Used  Substance Use Topics   Alcohol use: Not Currently   Drug use: No     Family Hx: The patient's family history includes CAD in his paternal grandfather; Diabetes in his maternal grandmother; Heart disease in his father; Hypertension in his maternal grandmother; Leukemia in his mother.  ROS:   Please see the history of present illness.    All other systems reviewed and are negative.   Prior CV studies:   The following studies were reviewed today:  TAVR OPERATIVE NOTE   Date of Procedure:11/21/2018  Preoperative Diagnosis:Severe Aortic Stenosis   Postoperative Diagnosis:Same   Procedure:   Transcatheter Aortic Valve  Replacement - PercutaneousRightTransfemoral Approach Edwards Sapien 3 THV (size 26mm, model # 9600TFX, serial # [id number])  Co-Surgeons:Clarence H. Cornelius Moras, MD and Verne Carrow, MD  Anesthesiologist:CharleneGreen, MD  Delano Metz, MD  Pre-operative Echo Findings: ? Severe aortic stenosis ? Normalleft ventricular systolic function  Post-operative Echo Findings: ? Noparavalvular leak ? Normalleft ventricular systolic function  _____________   Echo 11/22/2018 IMPRESSIONS  1. The left ventricle has hyperdynamic systolic function, with an ejection fraction of >65%. The cavity size was normal. There is moderate concentric left ventricular hypertrophy. Left ventricular diastolic function could not be evaluated due to  indeterminate diastolic function. Elevated left ventricular end-diastolic pressure.  2. The right ventricle has normal systolic function. The cavity was normal. There is no increase in right ventricular wall thickness.  3. There is moderate mitral annular calcification present.  4. S/P Edwards Sapien 3 THV (size 26mm). TAVR but poorly visualized. Gradient is consistent with moderate aortic stenosis. AV Mean Grad: 24.71mmHg. AV Area (VTI): 1.07 cm. LVOT/AV VTI ratio: 0.42. There is no perivavular AI.  Labs/Other Tests and Data Reviewed:    EKG:  No ECG reviewed.  Recent Labs: 11/17/2018: ALT 29; B Natriuretic Peptide 41.6 11/22/2018: BUN 17; Creatinine, Ser 1.25; Hemoglobin 16.4; Magnesium 1.8; Platelets 164; Potassium 4.6; Sodium 136  Recent Lipid Panel No results found for: CHOL, TRIG, HDL, CHOLHDL, LDLCALC, LDLDIRECT  Wt Readings from Last 3 Encounters:  11/29/18 218 lb (98.9 kg)  11/22/18 221 lb 12.5 oz (100.6 kg)  11/17/18 218 lb (98.9 kg)     Objective:    Vital Signs:  BP 130/70    Pulse 60    Wt 218 lb (98.9 kg)    BMI 29.57 kg/m    Well nourished,  well developed male in no acute distress. No LE edema. Mild bruising in right groin otherwise healing well.    ASSESSMENT & PLAN:    Severe AS s/p TAVR:  Doing great. Feeling much better in terms of his breathing. Groin sites are healing well. No s/s HAVB. HR in 60s by home BP monitor. Continue on aspirin and plavix. He has clindamycin for SBE prophylaxis.   HTN: BP well controlled. Continue telmisartan 40mg  daily.   Hypothyroidism: continue synthroid   COVID-19 Education: the signs and symptoms of COVID-19 were discussed with the patient and how to seek care for testing (follow up with PCP or arrange E-visit).  The importance of social distancing was discussed today.  Time:   Today, I have spent 25 minutes with the patient with telehealth technology discussing the above problems.     Medication Adjustments/Labs and Tests Ordered: Current medicines are reviewed at length with the patient today.  Concerns regarding medicines are outlined above.   Tests Ordered: No orders of the defined types were placed in this encounter.   Medication Changes: No orders of the defined types were placed in this encounter.   Disposition:  Follow up in 1 month(s)  Signed, Cline Crock, PA-C  11/29/2018 12:34 PM    [facility]

## 2018-11-29 NOTE — Patient Instructions (Signed)
Hello Mr. Aaron Foley,   It was so nice to talk to you on the phone today. I am so glad you are doing so well. I just wanted to send you a recap of our discussion.   Please continue taking antibiotics prior to any dental work including cleanings.Continue all your same medications.   I will see you back next month virtually and you will have an echo in the office the following week.  All appointment details are in upcoming appointments in [place].   Please call us with any questions or concerns you may have and please stay safe during these uncertain times.  Nell Range [phone]

## 2018-12-07 DIAGNOSIS — L578 Other skin changes due to chronic exposure to nonionizing radiation: Secondary | ICD-10-CM | POA: Diagnosis not present

## 2018-12-07 DIAGNOSIS — L821 Other seborrheic keratosis: Secondary | ICD-10-CM | POA: Diagnosis not present

## 2018-12-07 DIAGNOSIS — L570 Actinic keratosis: Secondary | ICD-10-CM | POA: Diagnosis not present

## 2018-12-14 DIAGNOSIS — M9901 Segmental and somatic dysfunction of cervical region: Secondary | ICD-10-CM | POA: Diagnosis not present

## 2018-12-14 DIAGNOSIS — M62830 Muscle spasm of back: Secondary | ICD-10-CM | POA: Diagnosis not present

## 2018-12-14 DIAGNOSIS — M542 Cervicalgia: Secondary | ICD-10-CM | POA: Diagnosis not present

## 2018-12-14 DIAGNOSIS — M9902 Segmental and somatic dysfunction of thoracic region: Secondary | ICD-10-CM | POA: Diagnosis not present

## 2018-12-14 DIAGNOSIS — M9903 Segmental and somatic dysfunction of lumbar region: Secondary | ICD-10-CM | POA: Diagnosis not present

## 2018-12-14 DIAGNOSIS — M9905 Segmental and somatic dysfunction of pelvic region: Secondary | ICD-10-CM | POA: Diagnosis not present

## 2018-12-20 ENCOUNTER — Other Ambulatory Visit: Payer: Self-pay

## 2018-12-20 ENCOUNTER — Telehealth (INDEPENDENT_AMBULATORY_CARE_PROVIDER_SITE_OTHER): Payer: Medicare HMO | Admitting: Physician Assistant

## 2018-12-20 ENCOUNTER — Encounter: Payer: Self-pay | Admitting: Physician Assistant

## 2018-12-20 ENCOUNTER — Other Ambulatory Visit: Payer: Self-pay | Admitting: Physician Assistant

## 2018-12-20 VITALS — BP 128/70 | HR 58 | Ht 72.0 in | Wt 220.0 lb

## 2018-12-20 DIAGNOSIS — Z952 Presence of prosthetic heart valve: Secondary | ICD-10-CM

## 2018-12-20 DIAGNOSIS — Z7189 Other specified counseling: Secondary | ICD-10-CM | POA: Diagnosis not present

## 2018-12-20 DIAGNOSIS — I10 Essential (primary) hypertension: Secondary | ICD-10-CM

## 2018-12-20 DIAGNOSIS — E039 Hypothyroidism, unspecified: Secondary | ICD-10-CM | POA: Diagnosis not present

## 2018-12-20 NOTE — Patient Instructions (Addendum)
Medication Instructions:  Your physician recommends that you continue on your current medications as directed. Please refer to the Current Medication list given to you today. You can stop Clopidogrel in mid October when you run out of tablets.  If you need a refill on your cardiac medications before your next appointment, please call your pharmacy.   Lab work: none If you have labs (blood work) drawn today and your tests are completely normal, you will receive your results only by: Marland Kitchen MyChart Message (if you have MyChart) OR . A paper copy in the mail If you have any lab test that is abnormal or we need to change your treatment, we will call you to review the results.  Testing/Procedures: Your physician has requested that you have an echocardiogram. Echocardiography is a painless test that uses sound waves to create images of your heart. It provides your doctor with information about the size and shape of your heart and how well your heart's chambers and valves are working. This procedure takes approximately one hour. There are no restrictions for this procedure.  This is scheduled for May 27,2020.  Follow-Up: Follow up on April 21,2021 with K. Grandville Silos, [place].  You will have an echocardiogram the same day.

## 2018-12-20 NOTE — Progress Notes (Signed)
HEART AND VASCULAR CENTER   MULTIDISCIPLINARY HEART VALVE TEAM     Virtual Visit via Video Note   This visit type was conducted due to national recommendations for restrictions regarding the COVID-19 Pandemic (e.g. social distancing) in an effort to limit this patient's exposure and mitigate transmission in our community.  Due to his co-morbid illnesses, this patient is at least at moderate risk for complications without adequate follow up.  This format is felt to be most appropriate for this patient at this time.  All issues noted in this document were discussed and addressed.  A limited physical exam was performed with this format.  Please refer to the patient's chart for his consent to telehealth for [facility].   Evaluation Performed:  Follow-up visit  Date:  12/20/2018   ID:  Aaron Foley, DOB 1940-10-16, MRN [id number]  Patient Location: Home Provider Location: Office  PCP:  Philemon Kingdom, MD  Cardiologist:  Norman Herrlich, MD / Dr. Clifton James & Dr. Cornelius Moras (TAVR)   Chief Complaint:  1 month s/p TAVR  History of Present Illness:    Aaron Foley is a 78 y.o. male with a history of HTN, HLD, hypothyroidism, and severe AS s/p TAVR (11/21/18) who presents for follow up.   The patient does not have symptoms concerning for COVID-19 infection (fever, chills, cough, or new shortness of breath).   Patient has remained physically active and relatively healthy all of his adult life. Approximately 6 years ago he was noted to have a heart murmur on physical exam by his primary care physician. He was diagnosed with aortic stenosis based on transthoracic echocardiogram and referred to Dr. Dulce Sellar who has been following him carefully for the past several years. Follow-up echocardiograms have documented gradual progression of aortic stenosis with preserved left ventricular systolic function. Most recent follow-up echocardiogram was performed September 27, 2018 and revealed severe  aortic stenosis with peak velocity across aortic valve measured 4.4 m/s corresponding to mean transvalvular gradient estimated 49 mmHg and aortic valve area calculated 0.70 cm. Left ventricular systolic function remain normal. The patient was referred to the multidisciplinary heart valve clinic and underwent diagnostic cardiac catheterization by Dr. Clifton James on October 12, 2018. Catheterization revealed mild nonobstructive coronary artery disease and confirmed the presence of severe aortic stenosis with peak to peak and mean transvalvular gradients measured 48 and 40.8 mmHg, respectively. Right heart pressures were normal.   He was evaluated by the multidisciplinary valve team and felt to have severe, symptomatic aortic stenosis. He underwent successful TAVR with a77mm Edwards Sapien 3 THV via the TF approach on4/21/20. Post operative echo showed EF 65%, poorly visualized TAVR with elevated mean gradient of 24 mm Hg and no PVL. He was discharged on POD1 with aspirin and plavix.   He has done well since TAVR. Today he presents for 1 month follow up. He is doing excellent walking three miles each day. No CP or SOB. No LE edema, orthopnea or PND. No dizziness or syncope. No blood in stool or urine. No palpitations. He is looking forward to going to [place] for 7 months.     Past Medical History:  Diagnosis Date  . Closed left ankle fracture 2014  . Disc disease, degenerative, cervical   . Diverticulosis    mild left colonic  . Hypercholesterolemia 08/10/2017  . Hypertension 08/10/2017  . Hypothyroidism 08/10/2017  . Low testosterone in male 08/10/2017  . Low vitamin B12 level 08/10/2017  . Mild aortic stenosis 08/10/2017  . Penis  disorder 08/10/2017   Perione's disease/penile fibrosis   . S/P TAVR (transcatheter aortic valve replacement)    26 mm Edwards Sapien 3 transcatheter heart valve placed via percutaneous right transfemoral approach   . Severe aortic stenosis   . Skin cancer    on  face/forehead basil cell  . Vitamin D deficiency 08/10/2017  . Wears glasses    readers   Past Surgical History:  Procedure Laterality Date  . COLONOSCOPY    . INTRAOPERATIVE TRANSTHORACIC ECHOCARDIOGRAM N/A 11/21/2018   Procedure: Intraoperative Transthoracic Echocardiogram;  Surgeon: Kathleene Hazel, MD;  Location: [facility];  Service: Open Heart Surgery;  Laterality: N/A;  . MENISCUS REPAIR Left   . RIGHT/LEFT HEART CATH AND CORONARY ANGIOGRAPHY N/A 10/12/2018   Procedure: RIGHT/LEFT HEART CATH AND CORONARY ANGIOGRAPHY;  Surgeon: Kathleene Hazel, MD;  Location: [facility] INVASIVE CV LAB;  Service: Cardiovascular;  Laterality: N/A;  . TRANSCATHETER AORTIC VALVE REPLACEMENT, TRANSFEMORAL N/A 11/21/2018   Procedure: TRANSCATHETER AORTIC VALVE REPLACEMENT, TRANSFEMORAL;  Surgeon: Kathleene Hazel, MD;  Location: [facility] OR;  Service: Open Heart Surgery;  Laterality: N/A;  . UMBILICAL HERNIA REPAIR       Current Meds  Medication Sig  . anastrozole (ARIMIDEX) 1 MG tablet Take 0.25 mg by mouth 2 (two) times a week. Pt states he is taking 1/4 of a tablet 2 times a week  . ARMOUR THYROID 30 MG tablet Take 30 mg by mouth daily before breakfast.   . aspirin 81 MG chewable tablet Chew 1 tablet (81 mg total) by mouth daily.  . cholecalciferol (VITAMIN D3) 25 MCG (1000 UT) tablet Take 1,000 Units by mouth daily.  . clindamycin (CLEOCIN) 300 MG capsule TAKE 2 CAPSULES BY MOUTH DAILY *take 30 minutes before appointment*  . clopidogrel (PLAVIX) 75 MG tablet Take 1 tablet (75 mg total) by mouth daily with breakfast.  . Glucosamine-MSM-Hyaluronic Acd (JOINT HEALTH PO) Take 1 capsule by mouth daily.  . Melatonin 1 MG TABS Take 0.5 tablets by mouth at bedtime.   . Menaquinone-7 (VITAMIN K2 PO) Take 1 tablet by mouth daily.  . Multiple Vitamins-Minerals (MULTIVITAMIN WITH MINERALS) tablet Take 1 tablet by mouth daily.  . sildenafil (REVATIO) 20 MG tablet Take 20 mg by mouth daily as needed (ED).   Marland Kitchen  telmisartan (MICARDIS) 40 MG tablet Take 40 mg by mouth at bedtime.   . Testosterone 20 % CREA Apply 1 application topically daily.   . vitamin C (ASCORBIC ACID) 500 MG tablet Take 1,000 mg by mouth daily.      Allergies:   Penicillins   Social History   Tobacco Use  . Smoking status: Former Smoker    Packs/day: 1.00    Years: 10.00    Pack years: 10.00    Types: Cigarettes    Last attempt to quit: 1969    Years since quitting: 51.4  . Smokeless tobacco: Never Used  Substance Use Topics  . Alcohol use: Not Currently  . Drug use: No     Family Hx: The patient's family history includes CAD in his paternal grandfather; Diabetes in his maternal grandmother; Heart disease in his father; Hypertension in his maternal grandmother; Leukemia in his mother.  ROS:   Please see the history of present illness.    All other systems reviewed and are negative.   Prior CV studies:   The following studies were reviewed today:  TAVR OPERATIVE NOTE   Date of Procedure:11/21/2018  Preoperative Diagnosis:Severe Aortic Stenosis   Postoperative Diagnosis:Same  Procedure:   Transcatheter Aortic Valve Replacement - PercutaneousRightTransfemoral Approach Edwards Sapien 3 THV (size 26mm, model # 9600TFX, serial # [id number])  Co-Surgeons:Clarence H. Cornelius Moras, MD and Verne Carrow, MD  Anesthesiologist:CharleneGreen, MD  Delano Metz, MD  Pre-operative Echo Findings: ? Severe aortic stenosis ? Normalleft ventricular systolic function  Post-operative Echo Findings: ? Noparavalvular leak ? Normalleft ventricular systolic function  _____________  Echo 11/22/2018 IMPRESSIONS 1. The left ventricle has hyperdynamic systolic function, with an ejection fraction of >65%. The cavity size was normal. There is moderate concentric left ventricular  hypertrophy. Left ventricular diastolic function could not be evaluated due to  indeterminate diastolic function. Elevated left ventricular end-diastolic pressure. 2. The right ventricle has normal systolic function. The cavity was normal. There is no increase in right ventricular wall thickness. 3. There is moderate mitral annular calcification present. 4. S/P Edwards Sapien 3 THV (size 26mm). TAVR but poorly visualized. Gradient is consistent with moderate aortic stenosis. AV Mean Grad: 24.11mmHg. AV Area (VTI): 1.07 cm. LVOT/AV VTI ratio: 0.42. There is no perivavular AI.  Labs/Other Tests and Data Reviewed:    EKG:  No ECG reviewed.  Recent Labs: 11/17/2018: ALT 29; B Natriuretic Peptide 41.6 11/22/2018: BUN 17; Creatinine, Ser 1.25; Hemoglobin 16.4; Magnesium 1.8; Platelets 164; Potassium 4.6; Sodium 136   Recent Lipid Panel No results found for: CHOL, TRIG, HDL, CHOLHDL, LDLCALC, LDLDIRECT  Wt Readings from Last 3 Encounters:  12/20/18 220 lb (99.8 kg)  11/29/18 218 lb (98.9 kg)  11/22/18 221 lb 12.5 oz (100.6 kg)     Objective:    Vital Signs:  BP 128/70 (BP Location: Left Arm, Patient Position: Sitting, Cuff Size: Normal)   Pulse (!) 58   Ht 6' (1.829 m)   Wt 220 lb (99.8 kg)   BMI 29.84 kg/m    Well nourished, well developed male in no acute distress.   ASSESSMENT & PLAN:    Severe AS s/p TAVR:  doing excellent with NYHA class I symptoms. Echocardiogram has been scheduled for next week. He has clindamycin for SBE prophylaxis. He can discontinued plavix after 6 months of therapy. He is leaving early June for [place] for 7-9 months. He will make an appointment with Dr Dulce Sellar for when he gets back unless it is close to his 1 year visit in which case he will just come see me.   HTN: BP well controlled. Continue same medications.   Hypothyroidism: continue synthroid  COVID-19 Education: the s/s of COVID-10 were discussed with thte patient and how ot see care for  testing (follow up with PCP or arrange E-visit).  The importance of social distancing was discussed today.  Time:   Today, I have spent 7 minutes with the patient with telehealth technology discussing the above problems.     Medication Adjustments/Labs and Tests Ordered: Current medicines are reviewed at length with the patient today.  Concerns regarding medicines are outlined above.   Tests Ordered: No orders of the defined types were placed in this encounter.   Medication Changes: No orders of the defined types were placed in this encounter.   Disposition:  Follow up in 1 year(s)  Signed, Cline Crock, PA-C  12/20/2018 1:18 PM    [facility]

## 2018-12-21 DIAGNOSIS — M9902 Segmental and somatic dysfunction of thoracic region: Secondary | ICD-10-CM | POA: Diagnosis not present

## 2018-12-21 DIAGNOSIS — M9903 Segmental and somatic dysfunction of lumbar region: Secondary | ICD-10-CM | POA: Diagnosis not present

## 2018-12-21 DIAGNOSIS — M9905 Segmental and somatic dysfunction of pelvic region: Secondary | ICD-10-CM | POA: Diagnosis not present

## 2018-12-21 DIAGNOSIS — M9901 Segmental and somatic dysfunction of cervical region: Secondary | ICD-10-CM | POA: Diagnosis not present

## 2018-12-21 DIAGNOSIS — M542 Cervicalgia: Secondary | ICD-10-CM | POA: Diagnosis not present

## 2018-12-21 DIAGNOSIS — M62830 Muscle spasm of back: Secondary | ICD-10-CM | POA: Diagnosis not present

## 2018-12-26 ENCOUNTER — Telehealth (HOSPITAL_COMMUNITY): Payer: Self-pay | Admitting: Radiology

## 2018-12-26 NOTE — Telephone Encounter (Signed)

## 2018-12-27 ENCOUNTER — Other Ambulatory Visit: Payer: Self-pay

## 2018-12-27 ENCOUNTER — Ambulatory Visit (HOSPITAL_COMMUNITY): Payer: Medicare HMO | Attending: Internal Medicine

## 2018-12-27 DIAGNOSIS — Z952 Presence of prosthetic heart valve: Secondary | ICD-10-CM | POA: Insufficient documentation

## 2018-12-29 DIAGNOSIS — L570 Actinic keratosis: Secondary | ICD-10-CM | POA: Diagnosis not present

## 2019-01-01 ENCOUNTER — Encounter: Payer: Medicare HMO | Admitting: Thoracic Surgery (Cardiothoracic Vascular Surgery)

## 2019-01-01 ENCOUNTER — Ambulatory Visit: Payer: Medicare HMO | Admitting: Physical Therapy

## 2019-01-01 DIAGNOSIS — M7061 Trochanteric bursitis, right hip: Secondary | ICD-10-CM | POA: Diagnosis not present

## 2019-01-23 ENCOUNTER — Encounter: Payer: Self-pay | Admitting: Thoracic Surgery (Cardiothoracic Vascular Surgery)

## 2019-03-05 ENCOUNTER — Other Ambulatory Visit (HOSPITAL_COMMUNITY): Payer: Medicare HMO

## 2019-03-14 DIAGNOSIS — M25551 Pain in right hip: Secondary | ICD-10-CM | POA: Diagnosis not present

## 2019-03-14 DIAGNOSIS — M7061 Trochanteric bursitis, right hip: Secondary | ICD-10-CM | POA: Diagnosis not present

## 2019-10-02 DIAGNOSIS — L578 Other skin changes due to chronic exposure to nonionizing radiation: Secondary | ICD-10-CM | POA: Diagnosis not present

## 2019-10-02 DIAGNOSIS — L570 Actinic keratosis: Secondary | ICD-10-CM | POA: Diagnosis not present

## 2019-10-02 DIAGNOSIS — L821 Other seborrheic keratosis: Secondary | ICD-10-CM | POA: Diagnosis not present

## 2019-10-02 DIAGNOSIS — B356 Tinea cruris: Secondary | ICD-10-CM | POA: Diagnosis not present

## 2019-10-18 DIAGNOSIS — Z0100 Encounter for examination of eyes and vision without abnormal findings: Secondary | ICD-10-CM | POA: Diagnosis not present

## 2019-10-25 DIAGNOSIS — L98499 Non-pressure chronic ulcer of skin of other sites with unspecified severity: Secondary | ICD-10-CM | POA: Diagnosis not present

## 2019-10-25 DIAGNOSIS — B356 Tinea cruris: Secondary | ICD-10-CM | POA: Diagnosis not present

## 2019-10-25 DIAGNOSIS — C4492 Squamous cell carcinoma of skin, unspecified: Secondary | ICD-10-CM | POA: Diagnosis not present

## 2019-10-25 DIAGNOSIS — L570 Actinic keratosis: Secondary | ICD-10-CM | POA: Diagnosis not present

## 2019-11-06 DIAGNOSIS — E559 Vitamin D deficiency, unspecified: Secondary | ICD-10-CM | POA: Diagnosis not present

## 2019-11-06 DIAGNOSIS — Z125 Encounter for screening for malignant neoplasm of prostate: Secondary | ICD-10-CM | POA: Diagnosis not present

## 2019-11-06 DIAGNOSIS — E538 Deficiency of other specified B group vitamins: Secondary | ICD-10-CM | POA: Diagnosis not present

## 2019-11-06 DIAGNOSIS — R790 Abnormal level of blood mineral: Secondary | ICD-10-CM | POA: Diagnosis not present

## 2019-11-06 DIAGNOSIS — E291 Testicular hypofunction: Secondary | ICD-10-CM | POA: Diagnosis not present

## 2019-11-06 DIAGNOSIS — R7301 Impaired fasting glucose: Secondary | ICD-10-CM | POA: Diagnosis not present

## 2019-11-06 DIAGNOSIS — E785 Hyperlipidemia, unspecified: Secondary | ICD-10-CM | POA: Diagnosis not present

## 2019-11-06 DIAGNOSIS — E039 Hypothyroidism, unspecified: Secondary | ICD-10-CM | POA: Diagnosis not present

## 2019-11-06 DIAGNOSIS — N529 Male erectile dysfunction, unspecified: Secondary | ICD-10-CM | POA: Diagnosis not present

## 2019-11-06 DIAGNOSIS — I10 Essential (primary) hypertension: Secondary | ICD-10-CM | POA: Diagnosis not present

## 2019-11-08 DIAGNOSIS — D485 Neoplasm of uncertain behavior of skin: Secondary | ICD-10-CM | POA: Diagnosis not present

## 2019-11-08 DIAGNOSIS — B079 Viral wart, unspecified: Secondary | ICD-10-CM | POA: Diagnosis not present

## 2019-11-08 DIAGNOSIS — L570 Actinic keratosis: Secondary | ICD-10-CM | POA: Diagnosis not present

## 2019-11-13 DIAGNOSIS — Z1339 Encounter for screening examination for other mental health and behavioral disorders: Secondary | ICD-10-CM | POA: Diagnosis not present

## 2019-11-13 DIAGNOSIS — M9901 Segmental and somatic dysfunction of cervical region: Secondary | ICD-10-CM | POA: Diagnosis not present

## 2019-11-13 DIAGNOSIS — K5790 Diverticulosis of intestine, part unspecified, without perforation or abscess without bleeding: Secondary | ICD-10-CM | POA: Diagnosis not present

## 2019-11-13 DIAGNOSIS — Z6832 Body mass index (BMI) 32.0-32.9, adult: Secondary | ICD-10-CM | POA: Diagnosis not present

## 2019-11-13 DIAGNOSIS — I350 Nonrheumatic aortic (valve) stenosis: Secondary | ICD-10-CM | POA: Diagnosis not present

## 2019-11-13 DIAGNOSIS — E039 Hypothyroidism, unspecified: Secondary | ICD-10-CM | POA: Diagnosis not present

## 2019-11-13 DIAGNOSIS — N481 Balanitis: Secondary | ICD-10-CM | POA: Diagnosis not present

## 2019-11-13 DIAGNOSIS — M542 Cervicalgia: Secondary | ICD-10-CM | POA: Diagnosis not present

## 2019-11-13 DIAGNOSIS — M62830 Muscle spasm of back: Secondary | ICD-10-CM | POA: Diagnosis not present

## 2019-11-13 DIAGNOSIS — M9903 Segmental and somatic dysfunction of lumbar region: Secondary | ICD-10-CM | POA: Diagnosis not present

## 2019-11-13 DIAGNOSIS — M9905 Segmental and somatic dysfunction of pelvic region: Secondary | ICD-10-CM | POA: Diagnosis not present

## 2019-11-13 DIAGNOSIS — Z1331 Encounter for screening for depression: Secondary | ICD-10-CM | POA: Diagnosis not present

## 2019-11-13 DIAGNOSIS — D582 Other hemoglobinopathies: Secondary | ICD-10-CM | POA: Diagnosis not present

## 2019-11-13 DIAGNOSIS — M9902 Segmental and somatic dysfunction of thoracic region: Secondary | ICD-10-CM | POA: Diagnosis not present

## 2019-11-13 DIAGNOSIS — I10 Essential (primary) hypertension: Secondary | ICD-10-CM | POA: Diagnosis not present

## 2019-11-13 DIAGNOSIS — Z0000 Encounter for general adult medical examination without abnormal findings: Secondary | ICD-10-CM | POA: Diagnosis not present

## 2019-11-19 NOTE — Progress Notes (Deleted)
HEART AND VASCULAR CENTER   MULTIDISCIPLINARY HEART VALVE CLINIC                                       Cardiology Office Note    Date:  11/19/2019   ID:  [name], DOB 12/25/1940, MRN [id number]  PCP:  Philemon Kingdom, MD  Cardiologist: Norman Herrlich, MD / Dr. Clifton James & Dr. Cornelius Moras (TAVR)   CC: 1 year s/p TAVR  History of Present Illness:  Aaron Foley is a 79 y.o. male with a history of HTN, HLD, hypothyroidism, and severe ASs/p TAVR (11/21/18) who presents for follow up.  He underwentsuccessful TAVR with a37mm Edwards Sapien 3 THV via the TF approach on4/21/20. Post operative echoshowed EF 65%, poorly visualized TAVR with elevated mean gradient of 24 mm Hg and no PVL. He was discharged on POD1 with aspirin and plavix. Pre op catheterization revealed mild nonobstructive coronary artery disease and confirmed the presence of severe aortic stenosis.  He has done well since TAVR and travelled to [place]. *** jewelry  Today he presents to clinic for follow up.   Past Medical History:  Diagnosis Date  . Closed left ankle fracture 2014  . Disc disease, degenerative, cervical   . Diverticulosis    mild left colonic  . Hypercholesterolemia 08/10/2017  . Hypertension 08/10/2017  . Hypothyroidism 08/10/2017  . Low testosterone in male 08/10/2017  . Low vitamin B12 level 08/10/2017  . Mild aortic stenosis 08/10/2017  . 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 S/P TAVR (transcatheter aortic valve replacement)    26 mm Edwards Sapien 3 transcatheter heart valve placed via percutaneous right transfemoral approach   . Severe aortic stenosis   . 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 Vitamin D deficiency 08/10/2017  . Wears glasses    readers    Past Surgical History:  Procedure Laterality Date  . COLONOSCOPY    . INTRAOPERATIVE TRANSTHORACIC ECHOCARDIOGRAM N/A 11/21/2018   Procedure: Intraoperative Transthoracic Echocardiogram;  Surgeon:  Kathleene Hazel, MD;  Location: [facility] OR;  Service: Open Heart Surgery;  Laterality: N/A;  . MENISCUS REPAIR Left   . RIGHT/LEFT HEART CATH AND CORONARY ANGIOGRAPHY N/A 10/12/2018   Procedure: RIGHT/LEFT HEART CATH AND CORONARY ANGIOGRAPHY;  Surgeon: Kathleene Hazel, MD;  Location: [facility] INVASIVE CV LAB;  Service: Cardiovascular;  Laterality: N/A;  . TRANSCATHETER AORTIC VALVE REPLACEMENT, TRANSFEMORAL N/A 11/21/2018   Procedure: TRANSCATHETER AORTIC VALVE REPLACEMENT, TRANSFEMORAL;  Surgeon: Kathleene Hazel, MD;  Location: [facility] OR;  Service: Open Heart Surgery;  Laterality: N/A;  . UMBILICAL HERNIA REPAIR      Current Medications: Outpatient Medications Prior to Visit  Medication Sig Dispense Refill  . anastrozole (ARIMIDEX) 1 MG tablet Take 0.25 mg by mouth 2 (two) times a week. Pt states he is taking 1/4 of a tablet 2 times a week    . ARMOUR THYROID 30 MG tablet Take 30 mg by mouth daily before breakfast.   4  . aspirin 81 MG chewable tablet Chew 1 tablet (81 mg total) by mouth daily.    . cholecalciferol (VITAMIN D3) 25 MCG (1000 UT) tablet Take 1,000 Units by mouth daily.    . clindamycin (CLEOCIN) 300 MG capsule TAKE 2 CAPSULES BY MOUTH DAILY *take 30 minutes before appointment* 10 capsule 2  . clopidogrel (PLAVIX) 75 MG tablet Take 1  tablet (75 mg total) by mouth daily with breakfast. 90 tablet 1  . Glucosamine-MSM-Hyaluronic Acd (JOINT HEALTH PO) Take 1 capsule by mouth daily.    . Melatonin 1 MG TABS Take 0.5 tablets by mouth at bedtime.     . Menaquinone-7 (VITAMIN K2 PO) Take 1 tablet by mouth daily.    . Multiple Vitamins-Minerals (MULTIVITAMIN WITH MINERALS) tablet Take 1 tablet by mouth daily.    . sildenafil (REVATIO) 20 MG tablet Take 20 mg by mouth daily as needed (ED).     Marland Kitchen telmisartan (MICARDIS) 40 MG tablet Take 40 mg by mouth at bedtime.     . Testosterone 20 % CREA Apply 1 application topically daily.     . vitamin C (ASCORBIC ACID) 500 MG tablet Take  1,000 mg by mouth daily.      No facility-administered medications prior to visit.     Allergies:   Penicillins   Social History   Socioeconomic History  . Marital status: Married    Spouse name: Not on file  . Number of children: Not on file  . Years of education: Not on file  . Highest education level: Not on file  Occupational History  . Not on file  Tobacco Use  . Smoking status: Former Smoker    Packs/day: 1.00    Years: 10.00    Pack years: 10.00    Types: Cigarettes    Quit date: 1969    Years since quitting: 52.3  . Smokeless tobacco: Never Used  Substance and Sexual Activity  . Alcohol use: Not Currently  . Drug use: No  . Sexual activity: Not on file  Other Topics Concern  . Not on file  Social History Narrative  . Not on file   Social Determinants of Health   Financial Resource Strain:   . Difficulty of Paying Living Expenses:   Food Insecurity:   . Worried About Programme researcher, broadcasting/film/video in the Last Year:   . Barista in the Last Year:   Transportation Needs:   . Freight forwarder (Medical):   Marland Kitchen Lack of Transportation (Non-Medical):   Physical Activity:   . Days of Exercise per Week:   . Minutes of Exercise per Session:   Stress:   . Feeling of Stress :   Social Connections:   . Frequency of Communication with Friends and Family:   . Frequency of Social Gatherings with Friends and Family:   . Attends Religious Services:   . Active Member of Clubs or Organizations:   . Attends Banker Meetings:   Marland Kitchen Marital Status:      Family History:  The patient's family history includes CAD in his paternal grandfather; Diabetes in his maternal grandmother; Heart disease in his father; Hypertension in his maternal grandmother; Leukemia in his mother.     ROS:   Please see the history of present illness.    ROS All other systems reviewed and are negative.   PHYSICAL EXAM:   VS:  There were no vitals taken for this visit.   GEN: Well  nourished, well developed, in no acute distress HEENT: normal Neck: no JVD or masses Cardiac: ***RRR; no murmurs, rubs, or gallops,no edema  Respiratory:  clear to auscultation bilaterally, normal work of breathing GI: soft, nontender, nondistended, + BS MS: no deformity or atrophy Skin: warm and dry, no rash Neuro:  Alert and Oriented x 3, Strength and sensation are intact Psych: euthymic mood, full affect  Wt Readings from Last 3 Encounters:  12/20/18 220 lb (99.8 kg)  11/29/18 218 lb (98.9 kg)  11/22/18 221 lb 12.5 oz (100.6 kg)      Studies/Labs Reviewed:   EKG:  EKG is*** ordered today.  The ekg ordered today demonstrates ***  Recent Labs: 11/22/2018: BUN 17; Creatinine, Ser 1.25; Hemoglobin 16.4; Magnesium 1.8; Platelets 164; Potassium 4.6; Sodium 136   Lipid Panel No results found for: CHOL, TRIG, HDL, CHOLHDL, VLDL, LDLCALC, LDLDIRECT  Additional studies/ records that were reviewed today include:  TAVR OPERATIVE NOTE   Date of Procedure:11/21/2018  Preoperative Diagnosis:Severe Aortic Stenosis   Postoperative Diagnosis:Same   Procedure:   Transcatheter Aortic Valve Replacement - PercutaneousRightTransfemoral Approach Edwards Sapien 3 THV (size 26mm, model # 9600TFX, serial # [id number])  Co-Surgeons:Clarence H. Cornelius Moras, MD and Verne Carrow, MD  Anesthesiologist:CharleneGreen, MD  Delano Metz, MD  Pre-operative Echo Findings: ? Severe aortic stenosis ? Normalleft ventricular systolic function  Post-operative Echo Findings: ? Noparavalvular leak ? Normalleft ventricular systolic function  _____________  Echo 11/22/2018 IMPRESSIONS 1. The left ventricle has hyperdynamic systolic function, with an ejection fraction of >65%. The cavity size was normal. There is moderate concentric left ventricular hypertrophy.  Left ventricular diastolic function could not be evaluated due to  indeterminate diastolic function. Elevated left ventricular end-diastolic pressure. 2. The right ventricle has normal systolic function. The cavity was normal. There is no increase in right ventricular wall thickness. 3. There is moderate mitral annular calcification present. 4. S/P Edwards Sapien 3 THV (size 26mm). TAVR but poorly visualized. Gradient is consistent with moderate aortic stenosis. AV Mean Grad: 24.76mmHg. AV Area (VTI): 1.07 cm. LVOT/AV VTI ratio: 0.42. There is no perivavular AI.  _____________  Echo 11/21/19 ***   ASSESSMENT & PLAN:   Severe AS s/p TAVR: doing excellent with NYHA class I symptoms. Echocardiogram has been scheduled for next week. He has clindamycin for SBE prophylaxis. He can discontinued plavix after 6 months of therapy. He is leaving early June for [place] for 7-9 months. He will make an appointment with Dr Dulce Sellar for when he gets back unless it is close to his 1 year visit in which case he will just come see me.    Medication Adjustments/Labs and Tests Ordered: Current medicines are reviewed at length with the patient today.  Concerns regarding medicines are outlined above.  Medication changes, Labs and Tests ordered today are listed in the Patient Instructions below. There are no Patient Instructions on file for this visit.   Signed, Cline Crock, PA-C  11/19/2019 12:14 PM    [facility] [address] [place], [place], [place]  [address] Phone: [phone]; Fax: [phone]

## 2019-11-21 ENCOUNTER — Other Ambulatory Visit: Payer: Self-pay

## 2019-11-21 ENCOUNTER — Ambulatory Visit (HOSPITAL_COMMUNITY): Payer: Medicare HMO | Attending: Cardiology

## 2019-11-21 ENCOUNTER — Ambulatory Visit: Payer: Medicare HMO | Admitting: Physician Assistant

## 2019-11-21 DIAGNOSIS — Z952 Presence of prosthetic heart valve: Secondary | ICD-10-CM

## 2019-11-21 NOTE — Progress Notes (Signed)
[facility]   MULTIDISCIPLINARY HEART VALVE TEAM    Virtual Visit via Telephone Note   This visit type was conducted due to national recommendations for restrictions regarding the COVID-19 Pandemic (e.g. social distancing) in an effort to limit this patient's exposure and mitigate transmission in our community.  Due to his co-morbid illnesses, this patient is at least at moderate risk for complications without adequate follow up.  This format is felt to be most appropriate for this patient at this time.  The patient did not have access to video technology/had technical difficulties with video requiring transitioning to audio format only (telephone).  All issues noted in this document were discussed and addressed.  No physical exam could be performed with this format.  Please refer to the patient's chart for his  consent to telehealth for [facility].   The patient was identified using 2 identifiers.  Date:  11/29/2019   ID:  Aaron Foley, DOB 1940-11-24, MRN [id number]  Patient Location: Home Provider Location: Office  PCP:  Philemon Kingdom, MD  Cardiologist:  Norman Herrlich, MD/Dr. Clifton James & Dr. Cornelius Moras (TAVR)  Evaluation Performed:  Follow-Up Visit  CC: 1 year s/p TAVR  History of Present Illness:    Aaron Foley is a 79 y.o. male with HTN, HLD, hypothyroidism, and severe ASs/p TAVR (11/21/18) who presents for follow up.  He underwentsuccessful TAVR with a35mm Edwards Sapien 3 THV via the TF approach on4/21/20. Post operative echoshowed EF 65%, poorly visualized TAVR with elevated mean gradient of 24 mm Hg and no PVL. He was discharged on POD1 with aspirin and plavix.Pre op catheterization revealed mild nonobstructive coronary artery disease and confirmed the presence of severe aortic stenosis.  He has done well since TAVR and travelled to [place]. Today he presents to clinic for follow up. The patient does not have symptoms concerning for COVID-19  infection (fever, chills, cough, or new shortness of breath). He does feel more shortness of breath in [place] than here from the altitude. He walks 3 miles and does push ups everyday. No CP or SOB. No LE edema, orthopnea or PND. No dizziness or syncope. No blood in stool or urine. No palpitations. Got his vaccine out in [place] and plans to go back next Monday.    Past Medical History:  Diagnosis Date  . Closed left ankle fracture 2014  . Disc disease, degenerative, cervical   . Diverticulosis    mild left colonic  . Hypercholesterolemia 08/10/2017  . Hypertension 08/10/2017  . Hypothyroidism 08/10/2017  . Low testosterone in male 08/10/2017  . Low vitamin B12 level 08/10/2017  . Mild aortic stenosis 08/10/2017  . 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 S/P TAVR (transcatheter aortic valve replacement)    26 mm Edwards Sapien 3 transcatheter heart valve placed via percutaneous right transfemoral approach   . Severe aortic stenosis   . 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 Vitamin D deficiency 08/10/2017  . Wears glasses    readers   Past Surgical History:  Procedure Laterality Date  . COLONOSCOPY    . INTRAOPERATIVE TRANSTHORACIC ECHOCARDIOGRAM N/A 11/21/2018   Procedure: Intraoperative Transthoracic Echocardiogram;  Surgeon: Kathleene Hazel, MD;  Location: [facility];  Service: Open Heart Surgery;  Laterality: N/A;  . MENISCUS REPAIR Left   . RIGHT/LEFT HEART CATH AND CORONARY ANGIOGRAPHY N/A 10/12/2018   Procedure: RIGHT/LEFT HEART CATH AND CORONARY ANGIOGRAPHY;  Surgeon: Kathleene Hazel, MD;  [facility] INVAS[facility] LAB;  Service: Cardiovascular;  Laterality: N/A;  . TRANSCATHETER AORTIC VALVE REPLACEMENT, TRANSFEMORAL N/A 11/21/2018   Procedure: TRANSCATHETER AORTIC VALVE REPLACEMENT, TRANSFEMORAL;  Surgeon: Kathleene Hazel, MD;  Location: [facility] OR;  Service: Open Heart Surgery;  Laterality: N/A;  . UMBILICAL HERNIA REPAIR       Current  Meds  Medication Sig  . anastrozole (ARIMIDEX) 1 MG tablet Take 0.25 mg by mouth 2 (two) times a week. Pt states he is taking 1/4 of a tablet 2 times a week  . ARMOUR THYROID 30 MG tablet Take 30 mg by mouth daily before breakfast.   . aspirin 81 MG chewable tablet Chew 1 tablet (81 mg total) by mouth daily.  . Glucosamine-MSM-Hyaluronic Acd (JOINT HEALTH PO) Take 1 capsule by mouth daily.  . Menaquinone-7 (VITAMIN K2 PO) Take 1 tablet by mouth daily.  . Multiple Vitamins-Minerals (MULTIVITAMIN WITH MINERALS) tablet Take 1 tablet by mouth daily.  . sildenafil (REVATIO) 20 MG tablet Take 20 mg by mouth daily as needed (ED).   Marland Kitchen telmisartan (MICARDIS) 40 MG tablet Take 60 mg by mouth at bedtime.   . Testosterone 20 % CREA Apply 1 application topically daily.   . vitamin C (ASCORBIC ACID) 500 MG tablet Take 1,000 mg by mouth daily.      Allergies:   Penicillins   Social History   Tobacco Use  . Smoking status: Former Smoker    Packs/day: 1.00    Years: 10.00    Pack years: 10.00    Types: Cigarettes    Quit date: 1969    Years since quitting: 52.3  . Smokeless tobacco: Never Used  Substance Use Topics  . Alcohol use: Not Currently  . Drug use: No     Family Hx: The patient's family history includes CAD in his paternal grandfather; Diabetes in his maternal grandmother; Heart disease in his father; Hypertension in his maternal grandmother; Leukemia in his mother.  ROS:   Please see the history of present illness.    All other systems reviewed and are negative.   Prior CV studies:   The following studies were reviewed today:  TAVR OPERATIVE NOTE   Date of Procedure:11/21/2018  Preoperative Diagnosis:Severe Aortic Stenosis   Postoperative Diagnosis:Same   Procedure:   Transcatheter Aortic Valve Replacement - PercutaneousRightTransfemoral Approach Edwards Sapien 3 THV (size 26mm, model # 9600TFX, serial #  [id number])  Co-Surgeons:Clarence H. Cornelius Moras, MD and Verne Carrow, MD  Anesthesiologist:CharleneGreen, MD  Delano Metz, MD  Pre-operative Echo Findings: ? Severe aortic stenosis ? Normalleft ventricular systolic function  Post-operative Echo Findings: ? Noparavalvular leak ? Normalleft ventricular systolic function  _____________  Echo 11/22/2018 IMPRESSIONS 1. The left ventricle has hyperdynamic systolic function, with an ejection fraction of >65%. The cavity size was normal. There is moderate concentric left ventricular hypertrophy. Left ventricular diastolic function could not be evaluated due to  indeterminate diastolic function. Elevated left ventricular end-diastolic pressure. 2. The right ventricle has normal systolic function. The cavity was normal. There is no increase in right ventricular wall thickness. 3. There is moderate mitral annular calcification present. 4. S/P Edwards Sapien 3 THV (size 26mm). TAVR but poorly visualized. Gradient is consistent with moderate aortic stenosis. AV Mean Grad: 24.34mmHg. AV Area (VTI): 1.07 cm. LVOT/AV VTI ratio: 0.42. There is no perivavular AI.   _____________   Echo 11/21/19 IMPRESSIONS  1. Left ventricular ejection fraction, by estimation, is 65 to 70%. The  left ventricle has normal function. The left ventricle  has no regional  wall motion abnormalities. Left ventricular diastolic parameters are  indeterminate.  2. Right ventricular systolic function is normal. The right ventricular  size is normal.  3. Left atrial size was mildly dilated.  4. The mitral valve is normal in structure. Trivial mitral valve  regurgitation. No evidence of mitral stenosis.  5. TAVR well seated, trivial perivalvular leak. Prior mean gradient 23  mmHg, today 19 mmHg. The aortic valve has been repaired/replaced. Aortic  valve regurgitation is  trivial. There is a 26 mm Edwards Edwards Sapien  prosthetic (TAVR) valve present in the aortic position. Procedure Date: 11/22/18. Echo findings are consistent with perivalvular leak of the aortic prosthesis.  6. The inferior vena cava is normal in size with greater than 50%  respiratory variability, suggesting right atrial pressure of 3 mmHg.   Comparison(s): No significant change from prior study.    Labs/Other Tests and Data Reviewed:    EKG:  No ECG reviewed.  Recent Labs: No results found for requested labs within last 8760 hours.   Recent Lipid Panel No results found for: CHOL, TRIG, HDL, CHOLHDL, LDLCALC, LDLDIRECT  Wt Readings from Last 3 Encounters:  11/29/19 224 lb (101.6 kg)  12/20/18 220 lb (99.8 kg)  11/29/18 218 lb (98.9 kg)     Objective:    Vital Signs:  BP 122/72   Pulse 73   Ht 5\' 11"  (1.803 m)   Wt 224 lb (101.6 kg)   BMI 31.24 kg/m    VITAL SIGNS:  reviewed  ASSESSMENT & PLAN:    Severe AS s/p TAVR: echo 11/21/19 showed EF 65%, normally functioning TAVR with a mean gradient of 19 mmHg (improved from 24 mm Hg) and trivial PVL. He is doing quite well with NYHA class I symtpoms. Continue on aspirin. She has clindamycin for SBE prophylaxis. He will continue regular follow up with Dr. Dulce Sellar.   COVID-19 Education: The signs and symptoms of COVID-19 were discussed with the patient and how to seek care for testing (follow up with PCP or arrange E-visit). The importance of social distancing was discussed today.  Time:   Today, I have spent 11 minutes with the patient with telehealth technology discussing the above problems.     Medication Adjustments/Labs and Tests Ordered: Current medicines are reviewed at length with the patient today.  Concerns regarding medicines are outlined above.   Tests Ordered: No orders of the defined types were placed in this encounter.   Medication Changes: No orders of the defined types were placed in this  encounter.   Follow Up:  In Person in 1 year(s)  Signed, Cline Crock, PA-C  11/29/2019 1:21 PM    [facility]

## 2019-11-23 DIAGNOSIS — R69 Illness, unspecified: Secondary | ICD-10-CM | POA: Diagnosis not present

## 2019-11-27 DIAGNOSIS — M9905 Segmental and somatic dysfunction of pelvic region: Secondary | ICD-10-CM | POA: Diagnosis not present

## 2019-11-27 DIAGNOSIS — M9903 Segmental and somatic dysfunction of lumbar region: Secondary | ICD-10-CM | POA: Diagnosis not present

## 2019-11-27 DIAGNOSIS — M9902 Segmental and somatic dysfunction of thoracic region: Secondary | ICD-10-CM | POA: Diagnosis not present

## 2019-11-27 DIAGNOSIS — M62830 Muscle spasm of back: Secondary | ICD-10-CM | POA: Diagnosis not present

## 2019-11-27 DIAGNOSIS — M9901 Segmental and somatic dysfunction of cervical region: Secondary | ICD-10-CM | POA: Diagnosis not present

## 2019-11-27 DIAGNOSIS — M542 Cervicalgia: Secondary | ICD-10-CM | POA: Diagnosis not present

## 2019-11-29 ENCOUNTER — Telehealth (INDEPENDENT_AMBULATORY_CARE_PROVIDER_SITE_OTHER): Payer: Medicare HMO | Admitting: Physician Assistant

## 2019-11-29 ENCOUNTER — Other Ambulatory Visit: Payer: Self-pay

## 2019-11-29 VITALS — BP 122/72 | HR 73 | Ht 71.0 in | Wt 224.0 lb

## 2019-11-29 DIAGNOSIS — Z952 Presence of prosthetic heart valve: Secondary | ICD-10-CM

## 2020-06-23 DIAGNOSIS — J029 Acute pharyngitis, unspecified: Secondary | ICD-10-CM | POA: Diagnosis not present

## 2020-07-08 DIAGNOSIS — I10 Essential (primary) hypertension: Secondary | ICD-10-CM | POA: Diagnosis not present

## 2020-07-08 DIAGNOSIS — K1379 Other lesions of oral mucosa: Secondary | ICD-10-CM | POA: Diagnosis not present

## 2020-08-01 DIAGNOSIS — R079 Chest pain, unspecified: Secondary | ICD-10-CM | POA: Diagnosis not present

## 2020-08-01 DIAGNOSIS — R7401 Elevation of levels of liver transaminase levels: Secondary | ICD-10-CM | POA: Diagnosis not present

## 2020-08-01 DIAGNOSIS — Z87891 Personal history of nicotine dependence: Secondary | ICD-10-CM | POA: Diagnosis not present

## 2020-08-01 DIAGNOSIS — D751 Secondary polycythemia: Secondary | ICD-10-CM | POA: Diagnosis not present

## 2020-08-01 DIAGNOSIS — Z8249 Family history of ischemic heart disease and other diseases of the circulatory system: Secondary | ICD-10-CM | POA: Diagnosis not present

## 2020-08-01 DIAGNOSIS — Z954 Presence of other heart-valve replacement: Secondary | ICD-10-CM | POA: Diagnosis not present

## 2020-08-01 DIAGNOSIS — I10 Essential (primary) hypertension: Secondary | ICD-10-CM | POA: Diagnosis not present

## 2020-08-01 DIAGNOSIS — I519 Heart disease, unspecified: Secondary | ICD-10-CM | POA: Diagnosis not present

## 2020-08-01 DIAGNOSIS — Z743 Need for continuous supervision: Secondary | ICD-10-CM | POA: Diagnosis not present

## 2020-08-01 DIAGNOSIS — Z79899 Other long term (current) drug therapy: Secondary | ICD-10-CM | POA: Diagnosis not present

## 2020-08-01 DIAGNOSIS — Z880 Allergy status to penicillin: Secondary | ICD-10-CM | POA: Diagnosis not present

## 2020-08-01 DIAGNOSIS — Z7982 Long term (current) use of aspirin: Secondary | ICD-10-CM | POA: Diagnosis not present

## 2020-08-01 DIAGNOSIS — I214 Non-ST elevation (NSTEMI) myocardial infarction: Secondary | ICD-10-CM

## 2020-08-01 DIAGNOSIS — E039 Hypothyroidism, unspecified: Secondary | ICD-10-CM | POA: Diagnosis not present

## 2020-08-01 HISTORY — DX: Non-ST elevation (NSTEMI) myocardial infarction: I21.4

## 2020-08-02 DIAGNOSIS — I2510 Atherosclerotic heart disease of native coronary artery without angina pectoris: Secondary | ICD-10-CM | POA: Diagnosis not present

## 2020-08-02 DIAGNOSIS — I061 Rheumatic aortic insufficiency: Secondary | ICD-10-CM | POA: Diagnosis not present

## 2020-08-02 DIAGNOSIS — Z743 Need for continuous supervision: Secondary | ICD-10-CM | POA: Diagnosis not present

## 2020-08-02 DIAGNOSIS — I472 Ventricular tachycardia: Secondary | ICD-10-CM | POA: Diagnosis not present

## 2020-08-02 DIAGNOSIS — I350 Nonrheumatic aortic (valve) stenosis: Secondary | ICD-10-CM | POA: Diagnosis not present

## 2020-08-02 DIAGNOSIS — Z953 Presence of xenogenic heart valve: Secondary | ICD-10-CM | POA: Diagnosis not present

## 2020-08-02 DIAGNOSIS — I10 Essential (primary) hypertension: Secondary | ICD-10-CM | POA: Diagnosis not present

## 2020-08-02 DIAGNOSIS — Z952 Presence of prosthetic heart valve: Secondary | ICD-10-CM | POA: Diagnosis not present

## 2020-08-02 DIAGNOSIS — I493 Ventricular premature depolarization: Secondary | ICD-10-CM | POA: Diagnosis not present

## 2020-08-02 DIAGNOSIS — Z6832 Body mass index (BMI) 32.0-32.9, adult: Secondary | ICD-10-CM | POA: Diagnosis not present

## 2020-08-02 DIAGNOSIS — R9431 Abnormal electrocardiogram [ECG] [EKG]: Secondary | ICD-10-CM | POA: Diagnosis not present

## 2020-08-02 DIAGNOSIS — R7401 Elevation of levels of liver transaminase levels: Secondary | ICD-10-CM | POA: Diagnosis not present

## 2020-08-02 DIAGNOSIS — D751 Secondary polycythemia: Secondary | ICD-10-CM | POA: Diagnosis not present

## 2020-08-02 DIAGNOSIS — I517 Cardiomegaly: Secondary | ICD-10-CM | POA: Diagnosis not present

## 2020-08-02 DIAGNOSIS — K760 Fatty (change of) liver, not elsewhere classified: Secondary | ICD-10-CM | POA: Diagnosis not present

## 2020-08-02 DIAGNOSIS — E039 Hypothyroidism, unspecified: Secondary | ICD-10-CM | POA: Diagnosis not present

## 2020-08-02 DIAGNOSIS — I214 Non-ST elevation (NSTEMI) myocardial infarction: Secondary | ICD-10-CM | POA: Diagnosis not present

## 2020-08-03 DIAGNOSIS — E668 Other obesity: Secondary | ICD-10-CM | POA: Diagnosis not present

## 2020-08-03 DIAGNOSIS — T8209XA Other mechanical complication of heart valve prosthesis, initial encounter: Secondary | ICD-10-CM | POA: Diagnosis not present

## 2020-08-03 DIAGNOSIS — R0902 Hypoxemia: Secondary | ICD-10-CM | POA: Diagnosis not present

## 2020-08-03 DIAGNOSIS — D751 Secondary polycythemia: Secondary | ICD-10-CM

## 2020-08-03 DIAGNOSIS — I214 Non-ST elevation (NSTEMI) myocardial infarction: Secondary | ICD-10-CM | POA: Diagnosis not present

## 2020-08-03 DIAGNOSIS — I2510 Atherosclerotic heart disease of native coronary artery without angina pectoris: Secondary | ICD-10-CM | POA: Diagnosis not present

## 2020-08-03 DIAGNOSIS — E669 Obesity, unspecified: Secondary | ICD-10-CM

## 2020-08-03 DIAGNOSIS — I25110 Atherosclerotic heart disease of native coronary artery with unstable angina pectoris: Secondary | ICD-10-CM | POA: Diagnosis not present

## 2020-08-03 DIAGNOSIS — E785 Hyperlipidemia, unspecified: Secondary | ICD-10-CM | POA: Diagnosis not present

## 2020-08-03 DIAGNOSIS — R0989 Other specified symptoms and signs involving the circulatory and respiratory systems: Secondary | ICD-10-CM | POA: Diagnosis not present

## 2020-08-03 DIAGNOSIS — E66811 Obesity, class 1: Secondary | ICD-10-CM

## 2020-08-03 DIAGNOSIS — Z789 Other specified health status: Secondary | ICD-10-CM

## 2020-08-03 DIAGNOSIS — E039 Hypothyroidism, unspecified: Secondary | ICD-10-CM | POA: Diagnosis not present

## 2020-08-03 DIAGNOSIS — R591 Generalized enlarged lymph nodes: Secondary | ICD-10-CM | POA: Diagnosis not present

## 2020-08-03 DIAGNOSIS — Z79890 Hormone replacement therapy (postmenopausal): Secondary | ICD-10-CM | POA: Insufficient documentation

## 2020-08-03 DIAGNOSIS — I10 Essential (primary) hypertension: Secondary | ICD-10-CM | POA: Diagnosis not present

## 2020-08-03 DIAGNOSIS — E7800 Pure hypercholesterolemia, unspecified: Secondary | ICD-10-CM | POA: Diagnosis not present

## 2020-08-03 DIAGNOSIS — K219 Gastro-esophageal reflux disease without esophagitis: Secondary | ICD-10-CM | POA: Insufficient documentation

## 2020-08-03 DIAGNOSIS — Z8679 Personal history of other diseases of the circulatory system: Secondary | ICD-10-CM

## 2020-08-03 DIAGNOSIS — Z952 Presence of prosthetic heart valve: Secondary | ICD-10-CM | POA: Diagnosis not present

## 2020-08-03 DIAGNOSIS — N400 Enlarged prostate without lower urinary tract symptoms: Secondary | ICD-10-CM | POA: Insufficient documentation

## 2020-08-03 HISTORY — DX: Personal history of other diseases of the circulatory system: Z86.79

## 2020-08-03 HISTORY — DX: Obesity, unspecified: E66.9

## 2020-08-03 HISTORY — DX: Gastro-esophageal reflux disease without esophagitis: K21.9

## 2020-08-03 HISTORY — DX: Obesity, class 1: E66.811

## 2020-08-03 HISTORY — DX: Other specified health status: Z78.9

## 2020-08-03 HISTORY — DX: Secondary polycythemia: D75.1

## 2020-08-03 HISTORY — DX: Hormone replacement therapy: Z79.890

## 2020-08-03 HISTORY — DX: Benign prostatic hyperplasia without lower urinary tract symptoms: N40.0

## 2020-08-04 DIAGNOSIS — R778 Other specified abnormalities of plasma proteins: Secondary | ICD-10-CM | POA: Insufficient documentation

## 2020-08-04 DIAGNOSIS — I2510 Atherosclerotic heart disease of native coronary artery without angina pectoris: Secondary | ICD-10-CM

## 2020-08-04 DIAGNOSIS — R7989 Other specified abnormal findings of blood chemistry: Secondary | ICD-10-CM

## 2020-08-04 HISTORY — DX: Atherosclerotic heart disease of native coronary artery without angina pectoris: I25.10

## 2020-08-04 HISTORY — DX: Other specified abnormal findings of blood chemistry: R79.89

## 2020-08-06 DIAGNOSIS — I214 Non-ST elevation (NSTEMI) myocardial infarction: Secondary | ICD-10-CM | POA: Diagnosis not present

## 2020-08-06 DIAGNOSIS — T82857A Stenosis of cardiac prosthetic devices, implants and grafts, initial encounter: Secondary | ICD-10-CM | POA: Diagnosis not present

## 2020-08-08 ENCOUNTER — Telehealth: Payer: Self-pay | Admitting: Cardiology

## 2020-08-08 ENCOUNTER — Telehealth (HOSPITAL_COMMUNITY): Payer: Self-pay

## 2020-08-08 NOTE — Telephone Encounter (Signed)
Delores is calling stating Aaron Foley was hospitalized while in [place]. She states she had the hospital fax over the notes and results to the [facility] office. A f/u has been scheduled for the [place] office for when they return back home. She is requesting the notes be faxed to the [facility] office for the appointment. Please advise.

## 2020-08-08 NOTE — Telephone Encounter (Signed)
Pt wife returned phone call and stated that they are currently still in [place] and will not be needing the cardiac rehab referral here at [facility]. Will discard the paper referral we received from [place].

## 2020-08-08 NOTE — Telephone Encounter (Signed)
Attempted to call pt a 2nd time- LM ON VM ° °Mailed letter out °

## 2020-08-11 NOTE — Telephone Encounter (Signed)
Records received from [place] / Rafael Gonzalez in file for appointment scheduled 09-18-2020 with Dr Bettina Gavia

## 2020-08-19 ENCOUNTER — Telehealth: Payer: Self-pay | Admitting: Cardiology

## 2020-08-19 DIAGNOSIS — I25110 Atherosclerotic heart disease of native coronary artery with unstable angina pectoris: Secondary | ICD-10-CM | POA: Diagnosis not present

## 2020-08-19 DIAGNOSIS — I10 Essential (primary) hypertension: Secondary | ICD-10-CM | POA: Diagnosis not present

## 2020-08-19 DIAGNOSIS — I350 Nonrheumatic aortic (valve) stenosis: Secondary | ICD-10-CM | POA: Diagnosis not present

## 2020-08-19 DIAGNOSIS — I214 Non-ST elevation (NSTEMI) myocardial infarction: Secondary | ICD-10-CM | POA: Diagnosis not present

## 2020-08-19 DIAGNOSIS — E785 Hyperlipidemia, unspecified: Secondary | ICD-10-CM | POA: Diagnosis not present

## 2020-08-19 NOTE — Telephone Encounter (Signed)
Patient's wife states the patient was seen by a cardiologist in [place]. She states his test results should ave been sent to the office and would like to know if they were received.

## 2020-09-01 ENCOUNTER — Encounter: Payer: Self-pay | Admitting: *Deleted

## 2020-09-04 IMAGING — DX DG CHEST 2V
2 series · 2 of 2 positions shown · non-contrast
Comparison: Chest x-ray 10/16/2015

CLINICAL DATA: Preop cardiac catheterization.

EXAM:
CHEST - 2 VIEW

[chest pa]
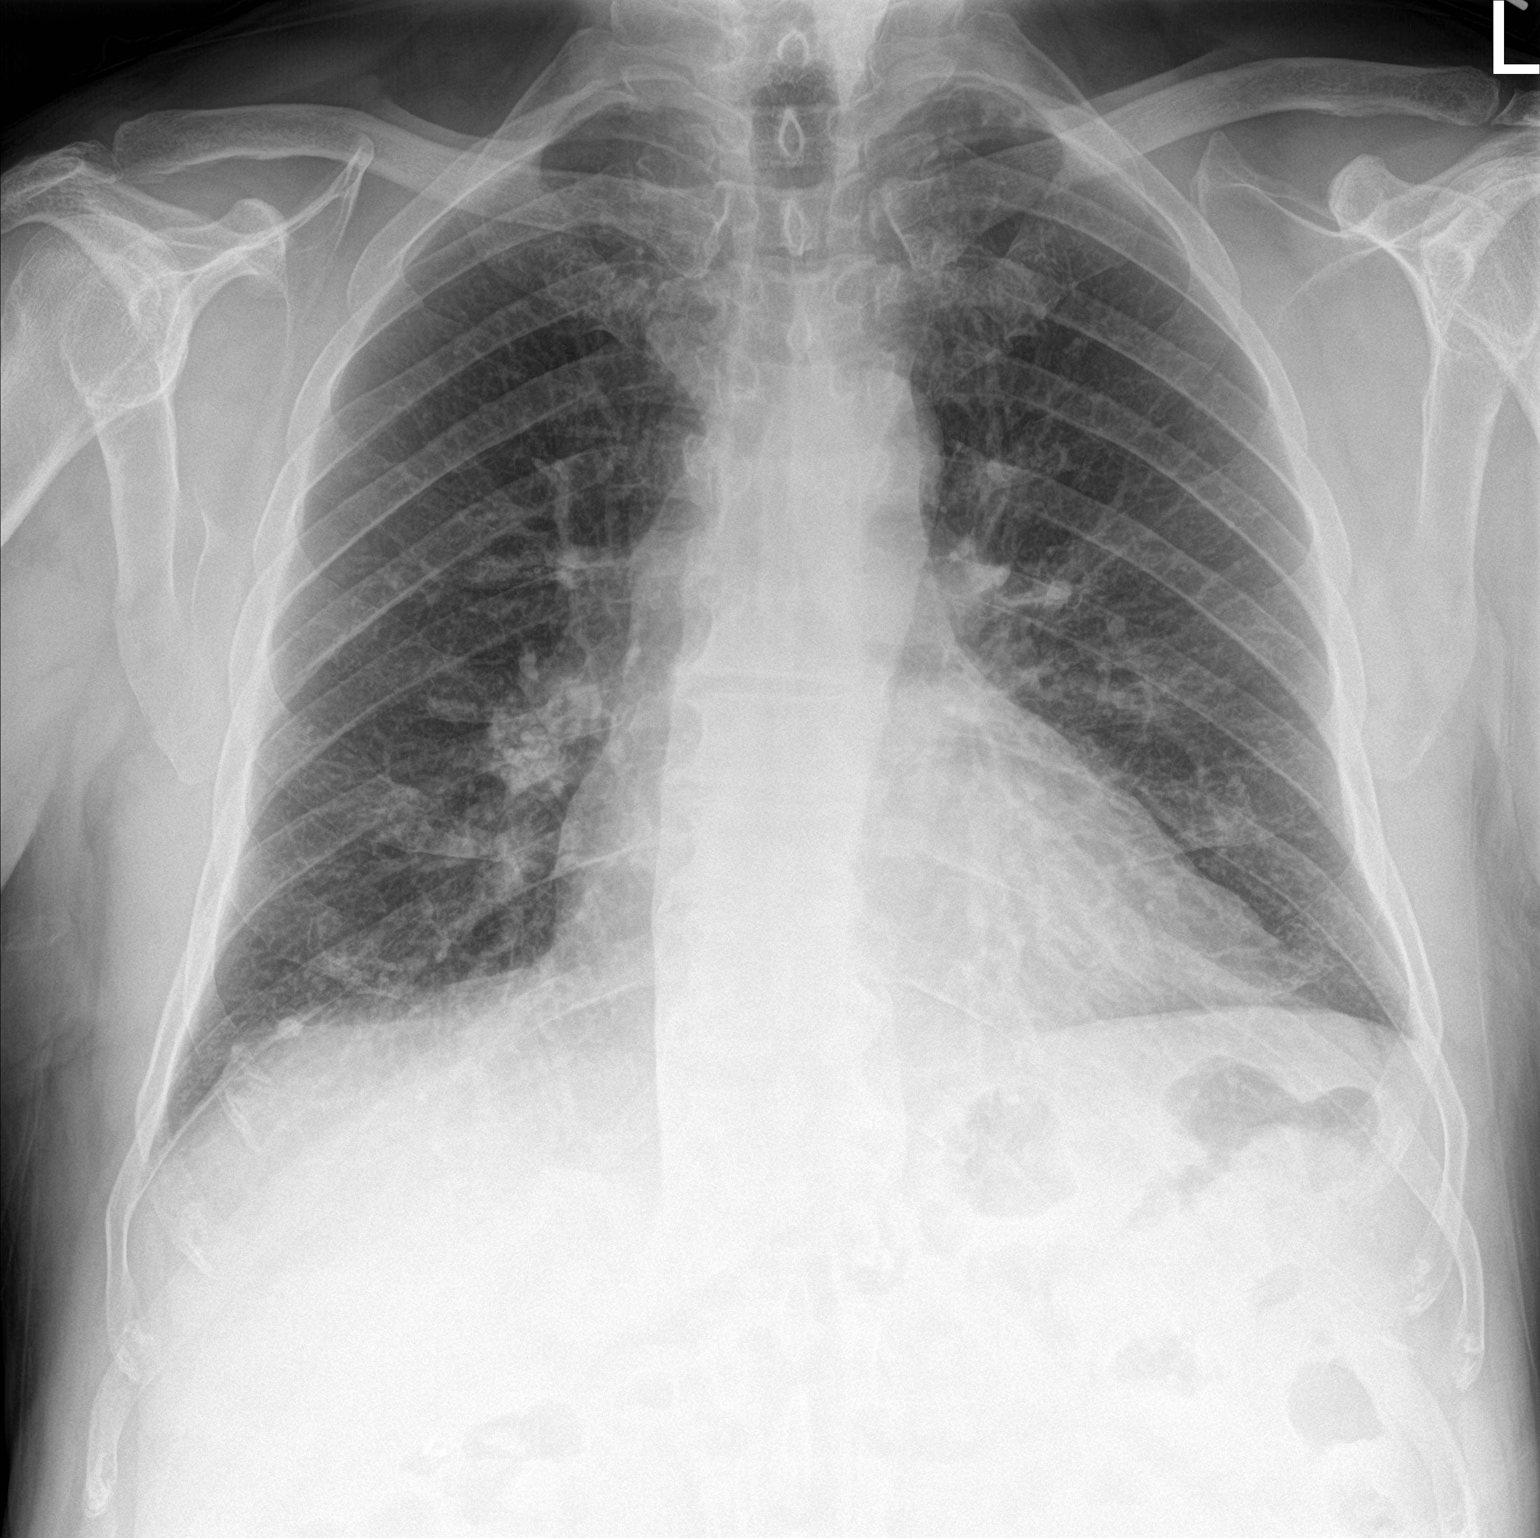

[chest lat]
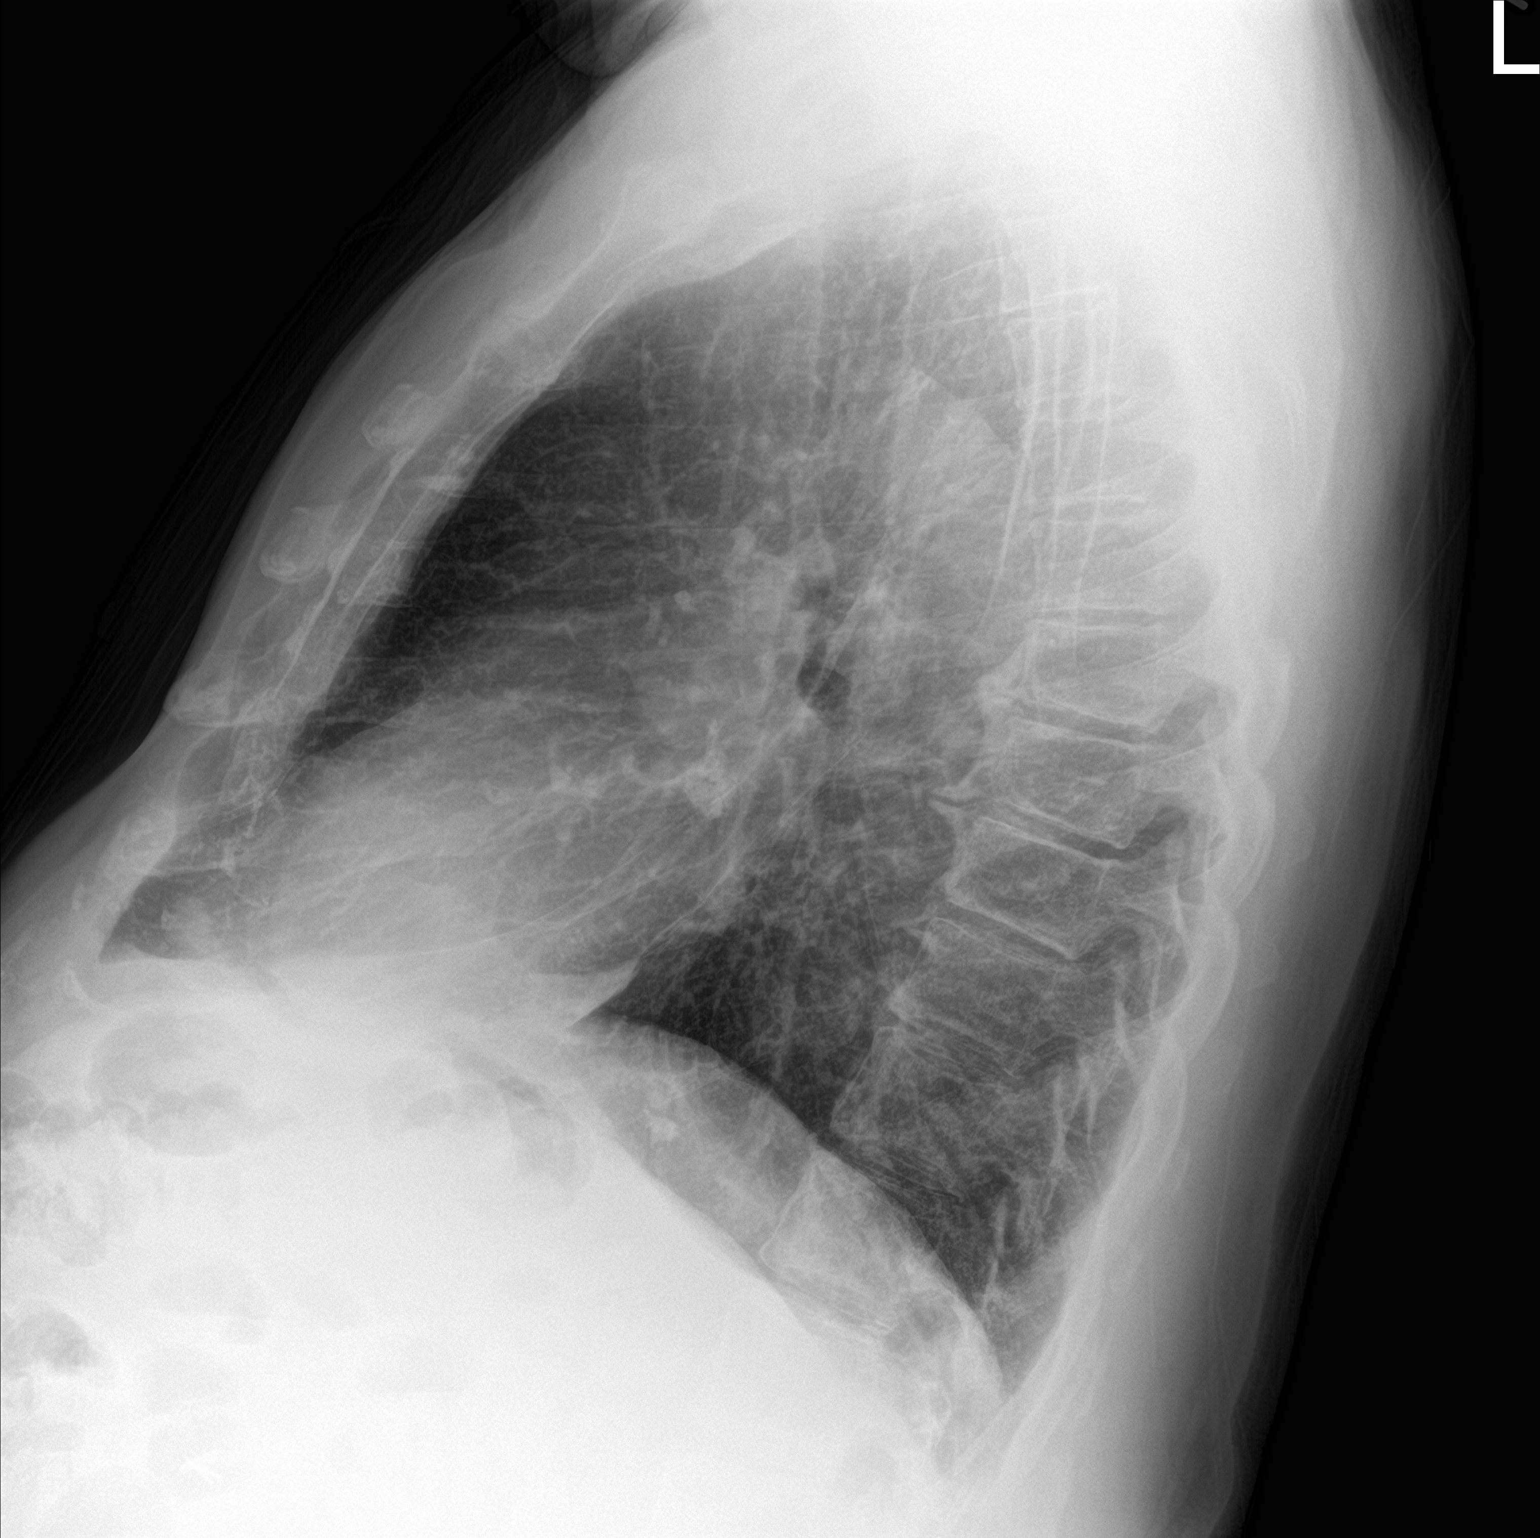

[2 of 2 positions shown; findings below may reference images not displayed]

FINDINGS: The heart is mildly enlarged but stable. The mediastinal and hilar
contours are within normal limits and stable. Calcified right hilar
lymph nodes are again noted along with a right lower lobe calcified
granuloma. No infiltrates, edema or effusions. No worrisome
pulmonary lesions. The bony thorax is intact.
IMPRESSION: No acute cardiopulmonary findings.

Stable mild cardiac enlargement and evidence of remote granulomatous
disease.

## 2020-09-17 ENCOUNTER — Other Ambulatory Visit: Payer: Self-pay | Admitting: Physician Assistant

## 2020-09-17 ENCOUNTER — Ambulatory Visit: Payer: Medicare HMO | Admitting: Cardiology

## 2020-09-17 DIAGNOSIS — Z953 Presence of xenogenic heart valve: Secondary | ICD-10-CM

## 2020-09-17 NOTE — Progress Notes (Unsigned)
Cardiology Office Note:    Date:  09/18/2020   ID:  Aaron Foley, DOB Jan 18, 1941, MRN [id number]  PCP:  Philemon Kingdom, MD  Cardiologist:  Norman Herrlich, MD    Referring MD: Philemon Kingdom, MD    ASSESSMENT:    1. S/p TAVR (transcatheter aortic valve replacement), bioprosthetic   2. NSTEMI (non-ST elevated myocardial infarction) (HCC)   3. Coronary artery disease of native artery of native heart with stable angina pectoris (HCC)   4. Essential hypertension   5. Hypercholesterolemia    PLAN:    In order of problems listed above:  1. He has leaflet thrombosis with increased gradient is anticoagulated presently asymptomatic and has arrangements to follow-up with the structural heart team. 2. Stable CAD had progression between 2 catheterizations agrees to a statin and if not tolerated PCSK9 inhibitor 3. BP at target continue ARB 4. Initiate lipid-lowering therapy with a statin   Next appointment: Follow-up with the structural heart team I will see him before he returns to [place]   Medication Adjustments/Labs and Tests Ordered: Current medicines are reviewed at length with the patient today.  Concerns regarding medicines are outlined above.  No orders of the defined types were placed in this encounter.  No orders of the defined types were placed in this encounter.   Chief Complaint  Patient presents with  . Follow-up    Recent admission [place] non-ST elevation MI and TAVR leaflet thrombosis    History of Present Illness:    Aaron Foley is a 80 y.o. male with a hx of aortic stenosis and hypertension last seen by me 09/28/2018.  Was referred to structural heart and underwent TAVR 11/21/2018 coronary angiography showed mild nonobstructive CAD.  Compliance with diet, lifestyle and medications: Yes  Records reviewed in care everywhere shows an ejection fraction of 60% on echocardiogram 08/02/2020 indeterminate diastolic filling pressures moderate  concentric LVH and abnormal aortic valve velocity peak 50 mmHg mean 30.  He underwent a TEE 08/06/2020 showed no evidence of left atrial appendage clot abnormal aortic valve with restricted leaflet excursion and severe bioprosthetic aortic valve stenosis.  He was initiated on anticoagulant therapy Eliquis 08/06/2020. Left heart catheterization was performed 08/05/2020 and it showed 40 to 50% diffuse left main stenosis with minimal luminal area 6.2 mm 60% ostial LAD stenosis 60% ostial right coronary artery stenosis.  He was admitted to [facility] 08/03/2020 in [place] for non-ST elevation MI he had chest pain troponin was elevated.  He did not have PCI he was found to have leaflet thrombosis TAVR and was anticoagulated and felt to be safe for discharge and follow-up in [place].  His previous echocardiogram performed 11/21/2018 done by the structural heart team showed a mean gradient of 19 mmHg.  In comparison to his coronary angiogram performed 10/12/2018 there appears to be progression in his CAD.  Send a secure chat to the structural heart team said they would reach out to him and arrange for follow-up and recommended he continue oral anticoagulation.  Researching my records 08/15/2017 lipid panel had a cholesterol 189 LDL 110 and HDL 32.  I reviewed his records with him he is taking both aspirin and Eliquis with late for thrombosis and ACS and this is 1 the few cases I would continue both he had progression of CAD and he agrees to lipid-lowering therapy he was intolerant of a atorvastatin and place him on rosuvastatin check lipids and LP(a) in a month and is not tolerated PCSK9 inhibitor is  appropriate.  Since discharge he is done well he is done several hikes 1 9 miles and has had no cardiovascular symptoms. No further chest pain shortness of breath palpitation or syncope. At diagnosis he had typical exertional angina when he tried to go for a hike and was on relieved with rest. Past  Medical History:  Diagnosis Date  . Aortic stenosis   . BPH (benign prostatic hyperplasia) 08/03/2020   Last Assessment & Plan:  Formatting of this note might be different from the original. Chronic S/p urological intervention  On no medications chronically  Plan: -Continue to monitor -Consider flomax if evidence of urinary retention  . Class 1 obesity in adult 08/03/2020   Last Assessment & Plan:  Formatting of this note might be different from the original. BMI 32.42 Complicates all aspects of care  Plan: -Continue to encourage diet and lifestyle modifications resulting in long term weight loss  . Closed left ankle fracture 2014  . Coronary artery disease involving native coronary artery 08/04/2020   Formatting of this note is different from the original. Left heart cath with Dr. Micheal Likens, 08/05/20: LEFT MAIN coronary artery: Large-caliber artery which divides into LAD and circumflex. It has diffuse up to 40-50% stenosis. LEFT ANTERIOR DESCENDING coronary artery: Large caliber vessel. It gives rise to multiple septal perforators and diagonal arteries. There is dual LAD system. After the origin   . Disc disease, degenerative, cervical   . Diverticulosis    mild left colonic  . Essential hypertension 08/10/2017  . GERD (gastroesophageal reflux disease) 08/03/2020   Last Assessment & Plan:  Formatting of this note might be different from the original. Pt reports chronic States he takes a digestive enzyme capsule TID with meals since his GB removal, states it is not a powder Also takes antacids regularly  Plan: -Daily PPI -ZenPep TID -Tums and Maalox available  . History of aortic stenosis 08/03/2020   Last Assessment & Plan:  Formatting of this note might be different from the original. Post TAVR -  TEE tomorrow  For  reeval  Of  Val - ?  Valve  Dysfunction  Source  Of Sx's - DR Denny Peon evaluating today  . Hormone replacement therapy 08/03/2020   Last Assessment & Plan:  Formatting of this note might be different  from the original. Patient reports he uses testosterone cream topically BID Duration of use 7-8 years Initial symptoms: fatigue, low energy  Plan: -Hold HRT at present  . Hypercholesterolemia 08/10/2017  . Hypertension 08/10/2017  . Hypothyroidism 08/10/2017  . Low testosterone in male 08/10/2017  . Low vitamin B12 level 08/10/2017  . Mild aortic stenosis 08/10/2017  . NSTEMI (non-ST elevated myocardial infarction) (HCC) 08/01/2020   Last Assessment & Plan:  Formatting of this note is different from the original. Pt initially presented to [facility] in [place] complaining of tightness in chest with walking EKG non-ischemic Trop: 0.189 ---> 1195 --->1039 S/p plavix load 600mg  po , lovenox 1mg /kg, metoprolol, asa Then taken for heart cath 08/02/20, which revealed 3 vessel disease CTS consulted and recommended transfer to [place] for CAB  . 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 Polycythemia 08/03/2020   Last Assessment & Plan:  Formatting of this note might be different from the original. Chronic - Likely secondary  To  Chronic  Hypoxemia /   Fe  Level - nl so  Unlikely hemachromatosis  -  Home  noc  Pulse oxy  needed  Plan: -Continue to  monitor -Consider therapeutic phlebotomy for Hct >60  . S/P TAVR (transcatheter aortic valve replacement)    26 mm Edwards Sapien 3 transcatheter heart valve placed via percutaneous right transfemoral approach   . Severe aortic stenosis   . 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 Vitamin D deficiency 08/10/2017  . Wears glasses    readers    Past Surgical History:  Procedure Laterality Date  . COLONOSCOPY    . INTRAOPERATIVE TRANSTHORACIC ECHOCARDIOGRAM N/A 11/21/2018   Procedure: Intraoperative Transthoracic Echocardiogram;  Surgeon: Kathleene Hazel, MD;  Location: [facility] OR;  Service: Open Heart Surgery;  Laterality: N/A;  . MENISCUS REPAIR Left   . RIGHT/LEFT HEART CATH AND CORONARY ANGIOGRAPHY N/A 10/12/2018   Procedure: RIGHT/LEFT HEART CATH  AND CORONARY ANGIOGRAPHY;  Surgeon: Kathleene Hazel, MD;  Location: [facility] INVASIVE CV LAB;  Service: Cardiovascular;  Laterality: N/A;  . TRANSCATHETER AORTIC VALVE REPLACEMENT, TRANSFEMORAL N/A 11/21/2018   Procedure: TRANSCATHETER AORTIC VALVE REPLACEMENT, TRANSFEMORAL;  Surgeon: Kathleene Hazel, MD;  Location: [facility] OR;  Service: Open Heart Surgery;  Laterality: N/A;  . UMBILICAL HERNIA REPAIR      Current Medications: Current Meds  Medication Sig  . anastrozole (ARIMIDEX) 1 MG tablet Take 0.25 mg by mouth 2 (two) times a week. Pt states he is taking 1/4 of a tablet 2 times a week  . ARMOUR THYROID 30 MG tablet Take 30 mg by mouth daily before breakfast.   . aspirin 81 MG chewable tablet Chew 1 tablet (81 mg total) by mouth daily.  . cholecalciferol (VITAMIN D3) 25 MCG (1000 UT) tablet Take by mouth daily.   . clindamycin (CLEOCIN) 300 MG capsule TAKE 2 CAPSULES BY MOUTH DAILY *take 30 minutes before appointment*  . ELIQUIS 5 MG TABS tablet Take 5 mg by mouth 2 (two) times daily.  . Glucosamine-MSM-Hyaluronic Acd (JOINT HEALTH PO) Take 1 capsule by mouth daily.  . Multiple Vitamins-Minerals (MULTIVITAMIN WITH MINERALS) tablet Take 1 tablet by mouth daily.  . nitroGLYCERIN (NITROSTAT) 0.4 MG SL tablet Place 0.4 mg under the tongue as directed.  . sildenafil (REVATIO) 20 MG tablet Take 20 mg by mouth daily as needed (ED).   Marland Kitchen telmisartan (MICARDIS) 40 MG tablet Take 60 mg by mouth at bedtime.   . Testosterone 20 % CREA Apply 1 application topically daily.   . vitamin C (ASCORBIC ACID) 500 MG tablet Take 1,000 mg by mouth daily.      Allergies:   Penicillins   Social History   Socioeconomic History  . Marital status: Married    Spouse name: Not on file  . Number of children: Not on file  . Years of education: Not on file  . Highest education level: Not on file  Occupational History  . Not on file  Tobacco Use  . Smoking status: Former Smoker    Packs/day: 1.00     Years: 10.00    Pack years: 10.00    Types: Cigarettes    Quit date: 1969    Years since quitting: 53.1  . Smokeless tobacco: Never Used  Vaping Use  . Vaping Use: Never used  Substance and Sexual Activity  . Alcohol use: Not Currently  . Drug use: No  . Sexual activity: Not on file  Other Topics Concern  . Not on file  Social History Narrative  . Not on file   Social Determinants of Health   Financial Resource Strain: Not on file  Food Insecurity: Not on  file  Transportation Needs: Not on file  Physical Activity: Not on file  Stress: Not on file  Social Connections: Not on file     Family History: The patient's family history includes CAD in his paternal grandfather; Diabetes in his maternal grandmother; Heart disease in his father; Hypertension in his maternal grandmother; Leukemia in his mother. ROS:   Please see the history of present illness.    All other systems reviewed and are negative.  EKGs/Labs/Other Studies Reviewed:    The following studies were reviewed today:  EKG:  EKG ordered today and personally reviewed.  The ekg ordered today demonstrates sinus rhythm nonspecific T waves otherwise normal    Physical Exam:    VS:  BP 130/72   Pulse 66   Ht 5\' 11"  (1.803 m)   Wt 227 lb 12.8 oz (103.3 kg)   SpO2 98%   BMI 31.77 kg/m     Wt Readings from Last 3 Encounters:  09/18/20 227 lb 12.8 oz (103.3 kg)  11/29/19 224 lb (101.6 kg)  12/20/18 220 lb (99.8 kg)     GEN:  Well nourished, well developed in no acute distress HEENT: Normal NECK: No JVD; No carotid bruits LYMPHATICS: No lymphadenopathy CARDIAC: Unimpressive exam 2 of 6 localized midsystolic murmur aortic area does not extend into S2 RRR, no murmurs, rubs, gallops RESPIRATORY:  Clear to auscultation without rales, wheezing or rhonchi  ABDOMEN: Soft, non-tender, non-distended MUSCULOSKELETAL:  No edema; No deformity  SKIN: Warm and dry NEUROLOGIC:  Alert and oriented x 3 PSYCHIATRIC:   Normal affect    Signed, Norman Herrlich, MD  09/18/2020 9:26 AM    [facility]

## 2020-09-18 ENCOUNTER — Other Ambulatory Visit: Payer: Self-pay

## 2020-09-18 ENCOUNTER — Ambulatory Visit: Payer: Medicare HMO | Admitting: Cardiology

## 2020-09-18 ENCOUNTER — Encounter: Payer: Self-pay | Admitting: Cardiology

## 2020-09-18 VITALS — BP 130/72 | HR 66 | Ht 71.0 in | Wt 227.8 lb

## 2020-09-18 DIAGNOSIS — I25118 Atherosclerotic heart disease of native coronary artery with other forms of angina pectoris: Secondary | ICD-10-CM

## 2020-09-18 DIAGNOSIS — Z953 Presence of xenogenic heart valve: Secondary | ICD-10-CM | POA: Diagnosis not present

## 2020-09-18 DIAGNOSIS — E7800 Pure hypercholesterolemia, unspecified: Secondary | ICD-10-CM

## 2020-09-18 DIAGNOSIS — I214 Non-ST elevation (NSTEMI) myocardial infarction: Secondary | ICD-10-CM

## 2020-09-18 DIAGNOSIS — I10 Essential (primary) hypertension: Secondary | ICD-10-CM

## 2020-09-18 MED ORDER — ROSUVASTATIN CALCIUM 10 MG PO TABS: 10.0000 mg | ORAL_TABLET | Freq: Every day | ORAL | 3 refills | Status: DC

## 2020-09-18 NOTE — Patient Instructions (Signed)
Medication Instructions:  Your physician has recommended you make the following change in your medication:  START: Rosuvastatin 10 mg take one tablet by mouth daily.  *If you need a refill on your cardiac medications before your next appointment, please call your pharmacy*   Lab Work: Your physician recommends that you return for lab work in: 1 month Lipids, Lpa If you have labs (blood work) drawn today and your tests are completely normal, you will receive your results only by: Marland Kitchen MyChart Message (if you have MyChart) OR . A paper copy in the mail If you have any lab test that is abnormal or we need to change your treatment, we will call you to review the results.   Testing/Procedures: None   Follow-Up: At [facility], you and your health needs are our priority.  As part of our continuing mission to provide you with exceptional heart care, we have created designated Provider Care Teams.  These Care Teams include your primary Cardiologist (physician) and Advanced Practice Providers (APPs -  Physician Assistants and Nurse Practitioners) who all work together to provide you with the care you need, when you need it.  We recommend signing up for the patient portal called "MyChart".  Sign up information is provided on this After Visit Summary.  MyChart is used to connect with patients for Virtual Visits (Telemedicine).  Patients are able to view lab/test results, encounter notes, upcoming appointments, etc.  Non-urgent messages can be sent to your provider as well.   To learn more about what you can do with MyChart, go to [email or web address].    Your next appointment:   Before you leave for [place].   The format for your next appointment:   In Person  Provider:   Shirlee More, MD   Other Instructions

## 2020-10-03 DIAGNOSIS — L578 Other skin changes due to chronic exposure to nonionizing radiation: Secondary | ICD-10-CM | POA: Diagnosis not present

## 2020-10-03 DIAGNOSIS — L570 Actinic keratosis: Secondary | ICD-10-CM | POA: Diagnosis not present

## 2020-10-03 DIAGNOSIS — L219 Seborrheic dermatitis, unspecified: Secondary | ICD-10-CM | POA: Diagnosis not present

## 2020-10-13 ENCOUNTER — Other Ambulatory Visit: Payer: Self-pay

## 2020-10-13 DIAGNOSIS — I10 Essential (primary) hypertension: Secondary | ICD-10-CM | POA: Diagnosis not present

## 2020-10-13 DIAGNOSIS — Z953 Presence of xenogenic heart valve: Secondary | ICD-10-CM | POA: Diagnosis not present

## 2020-10-13 DIAGNOSIS — I214 Non-ST elevation (NSTEMI) myocardial infarction: Secondary | ICD-10-CM | POA: Diagnosis not present

## 2020-10-13 DIAGNOSIS — I25118 Atherosclerotic heart disease of native coronary artery with other forms of angina pectoris: Secondary | ICD-10-CM

## 2020-10-13 DIAGNOSIS — E7800 Pure hypercholesterolemia, unspecified: Secondary | ICD-10-CM

## 2020-10-14 ENCOUNTER — Telehealth: Payer: Self-pay

## 2020-10-14 LAB — LIPOPROTEIN A (LPA): Lipoprotein (a): 81.4 nmol/L — ABNORMAL HIGH (ref <75.0)

## 2020-10-14 LAB — LIPID PANEL
Chol/HDL Ratio: 3.7 ratio (ref 0.0–5.0)
Cholesterol, Total: 118 mg/dL (ref 100–199)
HDL: 32 mg/dL — ABNORMAL LOW (ref >39)
LDL Chol Calc (NIH): 59 mg/dL (ref 0–99)
Triglycerides: 158 mg/dL — ABNORMAL HIGH (ref 0–149)
VLDL Cholesterol Cal: 27 mg/dL (ref 5–40)

## 2020-10-14 NOTE — Telephone Encounter (Signed)
Spoke with patient regarding results and recommendation.  Patient verbalizes understanding and is agreeable to plan of care. Advised patient to call back with any issues or concerns.  

## 2020-10-14 NOTE — Telephone Encounter (Signed)
-----   Message from Richardo Priest, MD sent at 10/14/2020  7:40 AM EDT ----- Marked improvement stay on his statin

## 2020-10-28 IMAGING — DX PORTABLE CHEST - 1 VIEW
1 series · 1 of 1 positions shown · non-contrast
Comparison: Radiographs November 17, 2018.

CLINICAL DATA: Status post transcatheter aortic valve replacement.

EXAM:
PORTABLE CHEST 1 VIEW

[chest ap]
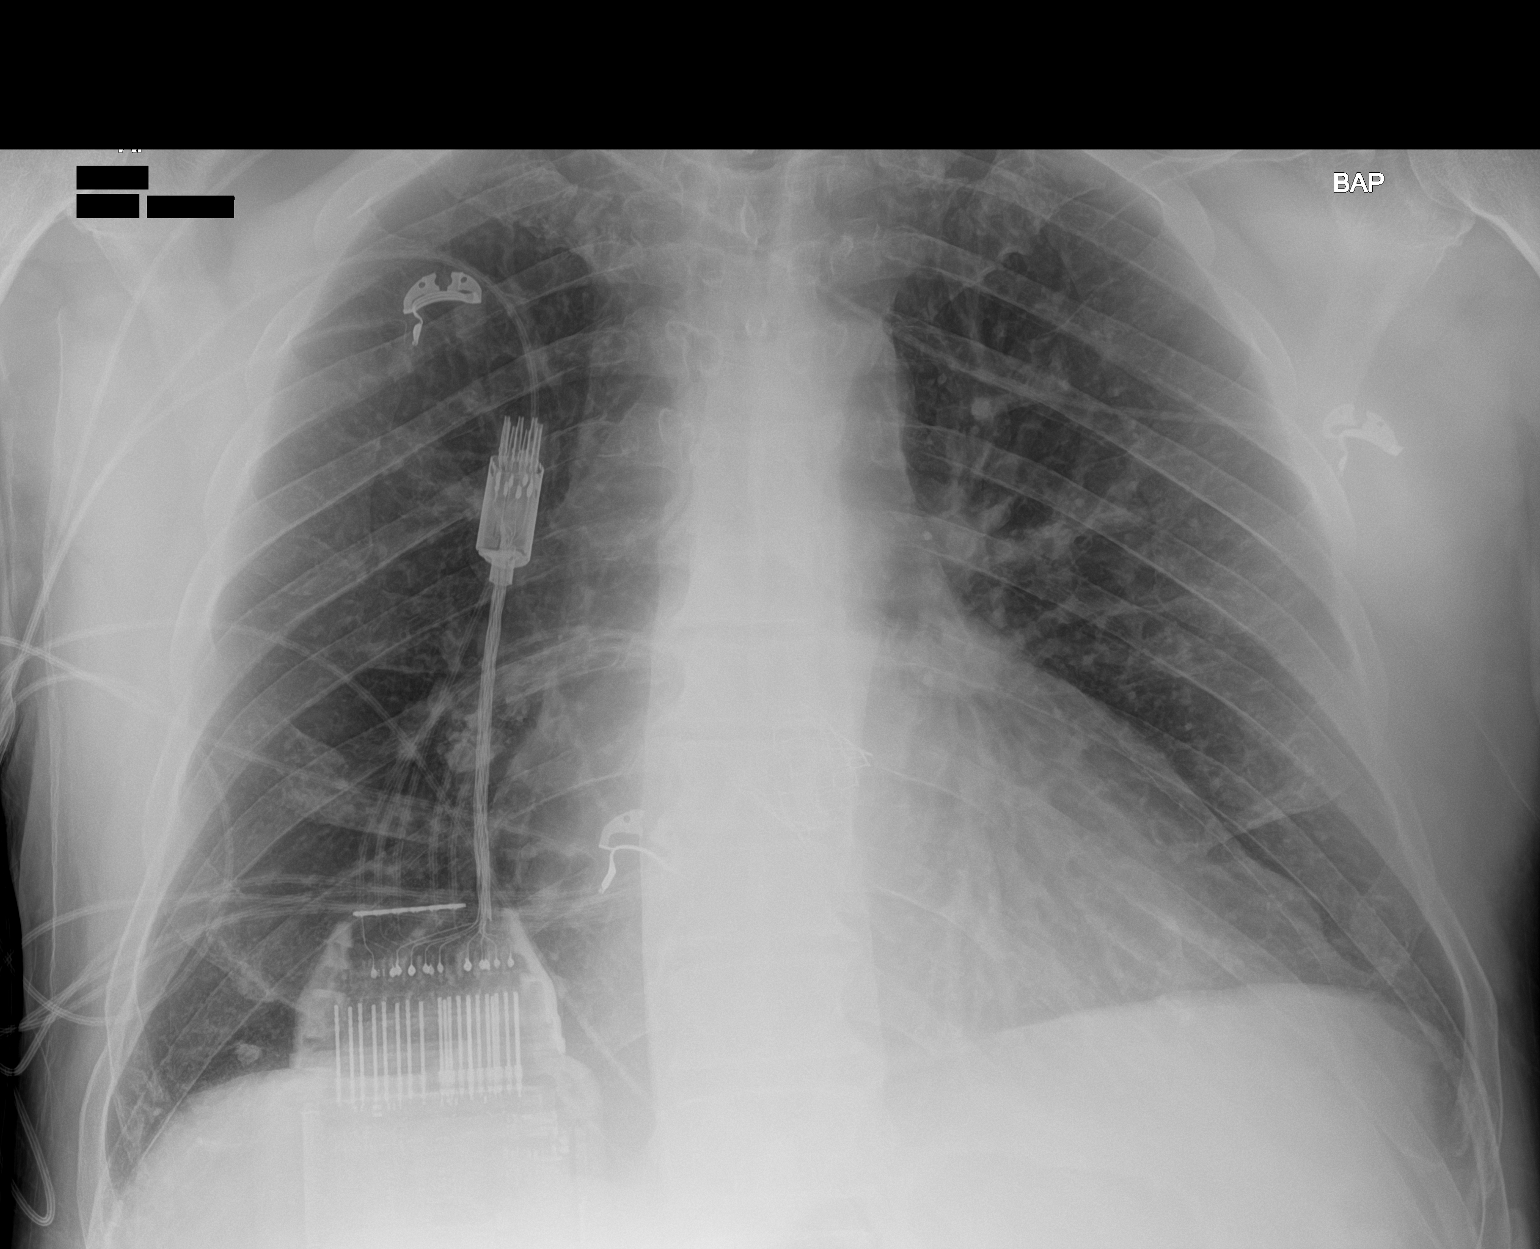

[1 of 1 positions shown; findings below may reference images not displayed]

FINDINGS: Stable cardiomegaly. Status post transcatheter aortic valve
replacement. No pneumothorax or pleural effusion is noted. No acute
pulmonary disease is noted. Bony thorax is unremarkable.
IMPRESSION: No active disease.

## 2020-11-01 NOTE — Progress Notes (Signed)
[facility]   MULTIDISCIPLINARY HEART VALVE CLINIC                                     Cardiology Office Note:    Date:  11/05/2020   ID:  Aaron Foley, DOB 25-Mar-1941, MRN [id number]  PCP:  Philemon Kingdom, MD  [facility] Cardiologist:  Norman Herrlich, MD / Dr. Clifton James & Dr. Cornelius Moras (TAVR) [facility] Electrophysiologist:  None   Referring MD: Philemon Kingdom, MD   Follow up TAVR with leaflet thrombosis on Eliquis  History of Present Illness:    Aaron Foley is a 80 y.o. male with a hx of  HTN, HLD, hypothyroidism, and severe ASs/p TAVR (11/21/18) who presents for follow up.  He was diagnosed with severe AS in 2020. Pre op catheterization revealed mild nonobstructive coronary artery disease and confirmed the presence of severe aortic stenosis. He underwentsuccessful TAVR with a76mm Edwards Sapien 3 THV via the TF approach on4/21/20. Post operative echoshowed EF 65%, poorly visualized TAVR with elevated mean gradient of 24 mm Hg and no PVL. He was discharged on POD1 with aspirin and plavix. 1 month echo showed echo 11/21/19 showed EF 65%, normally functioning TAVR with a mean gradient of 19 mmHg (improved from 24 mm Hg) and trivial PVL.   He did quite well in follow up and was able to travel back and forth to [place] with his wife. He was admitted to [facility] 08/03/2020 in [place] for NSTEMI. Echo at that time showed EF 60%, abnormal aortic valve velocity peak 50 mmHg and mean 30.  He underwent a TEE which showed an abnormal  aortic valve with restricted leaflet excursion and severe bioprosthetic aortic valve stenosis. Left heart catheterization was performed 08/05/2020 and it showed 40 to 50% diffuse left main stenosis with minimal luminal area 6.2 mm, 60% ostial LAD stenosis, 60% ostial right coronary artery stenosis. He did not undergo PCI and was started on Eliquis for leaflet thrombosis.   He saw Dr Dulce Sellar back in follow up 2/17 and  was started on a statin. Follow  Up 10/13/20 showed LDL 59.  Today he presents to clinic for follow up. Doing great. Exercises everyday. No issues with bleeding. No CP or SOB. No LE edema, orthopnea or PND. No dizziness or syncope. No blood in stool or urine. No palpitations.   Past Medical History:  Diagnosis Date  . Aortic stenosis   . BPH (benign prostatic hyperplasia) 08/03/2020   Last Assessment & Plan:  Formatting of this note might be different from the original. Chronic S/p urological intervention  On no medications chronically  Plan: -Continue to monitor -Consider flomax if evidence of urinary retention  . Class 1 obesity in adult 08/03/2020   Last Assessment & Plan:  Formatting of this note might be different from the original. BMI 32.42 Complicates all aspects of care  Plan: -Continue to encourage diet and lifestyle modifications resulting in long term weight loss  . Closed left ankle fracture 2014  . Coronary artery disease involving native coronary artery 08/04/2020   Formatting of this note is different from the original. Left heart cath with Dr. Micheal Likens, 08/05/20: LEFT MAIN coronary artery: Large-caliber artery which divides into LAD and circumflex. It has diffuse up to 40-50% stenosis. LEFT ANTERIOR DESCENDING coronary artery: Large caliber vessel. It gives rise to multiple septal perforators and diagonal arteries. There is dual LAD  system. After the origin   . Disc disease, degenerative, cervical   . Diverticulosis    mild left colonic  . Essential hypertension 08/10/2017  . GERD (gastroesophageal reflux disease) 08/03/2020   Last Assessment & Plan:  Formatting of this note might be different from the original. Pt reports chronic States he takes a digestive enzyme capsule TID with meals since his GB removal, states it is not a powder Also takes antacids regularly  Plan: -Daily PPI -ZenPep TID -Tums and Maalox available  . History of aortic stenosis 08/03/2020   Last Assessment & Plan:   Formatting of this note might be different from the original. Post TAVR -  TEE tomorrow  For  reeval  Of  Val - ?  Valve  Dysfunction  Source  Of Sx's - DR Denny Peon evaluating today  . Hormone replacement therapy 08/03/2020   Last Assessment & Plan:  Formatting of this note might be different from the original. Patient reports he uses testosterone cream topically BID Duration of use 7-8 years Initial symptoms: fatigue, low energy  Plan: -Hold HRT at present  . Hypercholesterolemia 08/10/2017  . Hypertension 08/10/2017  . Hypothyroidism 08/10/2017  . Low testosterone in male 08/10/2017  . Low vitamin B12 level 08/10/2017  . Mild aortic stenosis 08/10/2017  . NSTEMI (non-ST elevated myocardial infarction) (HCC) 08/01/2020   Last Assessment & Plan:  Formatting of this note is different from the original. Pt initially presented to [facility] in [place] complaining of tightness in chest with walking EKG non-ischemic Trop: 0.189 ---> 1195 --->1039 S/p plavix load 600mg  po , lovenox 1mg /kg, metoprolol, asa Then taken for heart cath 08/02/20, which revealed 3 vessel disease CTS consulted and recommended transfer to [place] for CAB  . 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 Polycythemia 08/03/2020   Last Assessment & Plan:  Formatting of this note might be different from the original. Chronic - Likely secondary  To  Chronic  Hypoxemia /   Fe  Level - nl so  Unlikely hemachromatosis  -  Home  noc  Pulse oxy  needed  Plan: -Continue to monitor -Consider therapeutic phlebotomy for Hct >60  . S/P TAVR (transcatheter aortic valve replacement)    26 mm Edwards Sapien 3 transcatheter heart valve placed via percutaneous right transfemoral approach   . Severe aortic stenosis   . 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 Vitamin D deficiency 08/10/2017  . Wears glasses    readers    Past Surgical History:  Procedure Laterality Date  . COLONOSCOPY    . INTRAOPERATIVE TRANSTHORACIC ECHOCARDIOGRAM N/A 11/21/2018    Procedure: Intraoperative Transthoracic Echocardiogram;  Surgeon: Kathleene Hazel, MD;  Location: [facility] OR;  Service: Open Heart Surgery;  Laterality: N/A;  . MENISCUS REPAIR Left   . RIGHT/LEFT HEART CATH AND CORONARY ANGIOGRAPHY N/A 10/12/2018   Procedure: RIGHT/LEFT HEART CATH AND CORONARY ANGIOGRAPHY;  Surgeon: Kathleene Hazel, MD;  Location: [facility] INVASIVE CV LAB;  Service: Cardiovascular;  Laterality: N/A;  . TRANSCATHETER AORTIC VALVE REPLACEMENT, TRANSFEMORAL N/A 11/21/2018   Procedure: TRANSCATHETER AORTIC VALVE REPLACEMENT, TRANSFEMORAL;  Surgeon: Kathleene Hazel, MD;  Location: [facility] OR;  Service: Open Heart Surgery;  Laterality: N/A;  . UMBILICAL HERNIA REPAIR      Current Medications: Current Meds  Medication Sig  . anastrozole (ARIMIDEX) 1 MG tablet Take 0.25 mg by mouth 2 (two) times a week. Pt states he is taking 1/4 of a tablet 2  times a week  . ARMOUR THYROID 30 MG tablet Take 30 mg by mouth daily before breakfast.   . azithromycin (ZITHROMAX) 500 MG tablet Take one tablet by mouth 1 hour prior to dental procedures  . cholecalciferol (VITAMIN D3) 25 MCG (1000 UT) tablet Take by mouth daily.   Marland Kitchen ELIQUIS 5 MG TABS tablet Take 5 mg by mouth 2 (two) times daily.  . Glucosamine-MSM-Hyaluronic Acd (JOINT HEALTH PO) Take 1 capsule by mouth daily.  . Multiple Vitamins-Minerals (MULTIVITAMIN WITH MINERALS) tablet Take 1 tablet by mouth daily.  . nitroGLYCERIN (NITROSTAT) 0.4 MG SL tablet Place 0.4 mg under the tongue as directed.  . rosuvastatin (CRESTOR) 10 MG tablet Take 1 tablet (10 mg total) by mouth daily.  . sildenafil (REVATIO) 20 MG tablet Take 20 mg by mouth daily as needed (ED).   Marland Kitchen telmisartan (MICARDIS) 40 MG tablet Take 60 mg by mouth at bedtime.   . Testosterone 20 % CREA Apply 1 application topically daily.   . vitamin C (ASCORBIC ACID) 500 MG tablet Take 1,000 mg by mouth daily.   . [DISCONTINUED] aspirin 81 MG chewable tablet Chew 1 tablet (81 mg  total) by mouth daily.  . [DISCONTINUED] clindamycin (CLEOCIN) 300 MG capsule TAKE 2 CAPSULES BY MOUTH DAILY *take 30 minutes before appointment*     Allergies:   Penicillins and Lipitor [atorvastatin]   Social History   Socioeconomic History  . Marital status: Married    Spouse name: Not on file  . Number of children: Not on file  . Years of education: Not on file  . Highest education level: Not on file  Occupational History  . Not on file  Tobacco Use  . Smoking status: Former Smoker    Packs/day: 1.00    Years: 10.00    Pack years: 10.00    Types: Cigarettes    Quit date: 1969    Years since quitting: 53.2  . Smokeless tobacco: Never Used  Vaping Use  . Vaping Use: Never used  Substance and Sexual Activity  . Alcohol use: Not Currently  . Drug use: No  . Sexual activity: Not on file  Other Topics Concern  . Not on file  Social History Narrative  . Not on file   Social Determinants of Health   Financial Resource Strain: Not on file  Food Insecurity: Not on file  Transportation Needs: Not on file  Physical Activity: Not on file  Stress: Not on file  Social Connections: Not on file     Family History: The patient's family history includes CAD in his paternal grandfather; Diabetes in his maternal grandmother; Heart disease in his father; Hypertension in his maternal grandmother; Leukemia in his mother.  ROS:   Please see the history of present illness.    All other systems reviewed and are negative.  EKGs/Labs/Other Studies Reviewed:    The following studies were reviewed today:  TAVR OPERATIVE NOTE   Date of Procedure:11/21/2018  Preoperative Diagnosis:Severe Aortic Stenosis   Postoperative Diagnosis:Same   Procedure:   Transcatheter Aortic Valve Replacement - PercutaneousRightTransfemoral Approach Edwards Sapien 3 THV (size 26mm, model # 9600TFX, serial #  [id number])  Co-Surgeons:Clarence H. Cornelius Moras, MD and Verne Carrow, MD  Anesthesiologist:CharleneGreen, MD  Delano Metz, MD  Pre-operative Echo Findings: ? Severe aortic stenosis ? Normalleft ventricular systolic function  Post-operative Echo Findings: ? Noparavalvular leak ? Normalleft ventricular systolic function  _____________  Echo 11/22/2018 IMPRESSIONS 1. The left ventricle has hyperdynamic systolic function, with an ejection  fraction of >65%. The cavity size was normal. There is moderate concentric left ventricular hypertrophy. Left ventricular diastolic function could not be evaluated due to  indeterminate diastolic function. Elevated left ventricular end-diastolic pressure. 2. The right ventricle has normal systolic function. The cavity was normal. There is no increase in right ventricular wall thickness. 3. There is moderate mitral annular calcification present. 4. S/P Edwards Sapien 3 THV (size 26mm). TAVR but poorly visualized. Gradient is consistent with moderate aortic stenosis. AV Mean Grad: 24.82mmHg. AV Area (VTI): 1.07 cm. LVOT/AV VTI ratio: 0.42. There is no perivavular AI.   _____________   Echo4/21/21 IMPRESSIONS  1. Left ventricular ejection fraction, by estimation, is 65 to 70%. The  left ventricle has normal function. The left ventricle has no regional  wall motion abnormalities. Left ventricular diastolic parameters are  indeterminate.  2. Right ventricular systolic function is normal. The right ventricular  size is normal.  3. Left atrial size was mildly dilated.  4. The mitral valve is normal in structure. Trivial mitral valve  regurgitation. No evidence of mitral stenosis.  5. TAVR well seated, trivial perivalvular leak. Prior mean gradient 23  mmHg, today 19 mmHg. The aortic valve has been repaired/replaced. Aortic  valve regurgitation is  trivial. There is a 26 mm Edwards Edwards Sapien  prosthetic (TAVR) valve present in the aortic position. Procedure Date: 11/22/18. Echo findings are consistent with perivalvular leak of the aortic prosthesis.  6. The inferior vena cava is normal in size with greater than 50%  respiratory variability, suggesting right atrial pressure of 3 mmHg.   Comparison(s): No significant change from prior study.   _______________________  TEE 08/06/20 Echocardiogram TEE Complete  Anatomical Region Laterality Modality  -- -- Other  -- -- Echocardiography    Impression Performed by CPACS 1. Normal left ventricular systolic function, est LVEF 55-60%.  2. No evidence of thrombus within LA or LA appendage with normal LAA emptying velocity.  3. Severe bioprosthetic aortic valve stenosis with restricted leaflet excursion.  4. No other significant valvular abnormalities.  5. Mild atherosclerosis of the descending aorta and aortic arch .  6. Agitated saline bubble study negative for right-to-left interatrial shunt, with and without Valsalva.   Spectral Doppler and Color Flow Velocity Mapping were used to interrogate the valves and cardiac structures.   ___________________  [facility] 08/05/20 Impression Performed by CPACS 1. 40-50% diffuse left main stenosis with minimal luminal area of 6.6  mm, 60% ostial LAD stenosis with minimal luminal area of 4.7 cm and 60%  ostial RCA stenosis with minimal luminal area of 6.8 mm. Initially,  ventricularization of coronary arterial waveform was noted after deep  engagement of 70F EBU 3.5 guide into left main-ostial LAD which resolved  after appropriate guide catheter manipulation.   RECOMMENDATIONS:  1. Routine post coronary angiography care. Start deflating TR band after 2  hour. 0.9% NS at 125 mL/kg/hr for 4 hrs.  2. Aggressive CVD risk factor modification.  3. No weightlifting (>10 lb) for 10 days.   _____________________  Echo 11/05/20 IMPRESSIONS  1.  Left ventricular ejection fraction, by estimation, is 65 to 70%. The  left ventricle has normal function. The left ventricle has no regional  wall motion abnormalities. Left ventricular diastolic parameters are  consistent with Grade I diastolic  dysfunction (impaired relaxation). Elevated left ventricular end-diastolic  pressure.  2. Right ventricular systolic function is normal. The right ventricular  size is moderately enlarged. Tricuspid regurgitation signal is inadequate  for assessing PA pressure.  3. The mitral valve is normal in structure. No evidence of mitral valve  regurgitation. No evidence of mitral stenosis.  4. The aortic valve has been repaired/replaced. There is a 26 mm Sapien  prosthetic, stented (TAVR) valve present in the aortic position. Aortic  valve regurgitation is trivial. No aortic stenosis is present. Aortic  valve mean gradient measures 20.0 mmHg.  Aortic valve Vmax measures 3.03 m/s. DI is 0.42.  5. Aortic dilatation noted. There is mild dilatation of the ascending  aorta, measuring 38 mm.  6. The inferior vena cava is normal in size with greater than 50%  respiratory variability, suggesting right atrial pressure of 3 mmHg.  7. Compared to prior echo, the mean AVG is stable at ( on  prior echo), DI slightly decreased from 0.48 to 0.42 today. The ascending  aorta has increased from 3.6cm to 3.8cm today.   EKG:  EKG is NOT ordered today.  Recent Labs: No results found for requested labs within last 8760 hours.  Recent Lipid Panel    Component Value Date/Time   CHOL 118 10/13/2020 0937   TRIG 158 (H) 10/13/2020 0937   HDL 32 (L) 10/13/2020 0937   CHOLHDL 3.7 10/13/2020 0937   LDLCALC 59 10/13/2020 0937     Risk Assessment/Calculations:       Physical Exam:    VS:  BP 134/76   Pulse 67   Ht 5\' 11"  (1.803 m)   Wt 228 lb 6.4 oz (103.6 kg)   SpO2 97%   BMI 31.86 kg/m     Wt Readings from Last 3 Encounters:  11/05/20 228  lb 6.4 oz (103.6 kg)  09/18/20 227 lb 12.8 oz (103.3 kg)  11/29/19 224 lb (101.6 kg)     GEN:  Well nourished, well developed in no acute distress HEENT: Normal NECK: No JVD; No carotid bruits LYMPHATICS: No lymphadenopathy CARDIAC: RRR, soft flow murmurs. No rubs, gallops RESPIRATORY:  Clear to auscultation without rales, wheezing or rhonchi  ABDOMEN: Soft, non-tender, non-distended MUSCULOSKELETAL:  No edema; No deformity  SKIN: Warm and dry NEUROLOGIC:  Alert and oriented x 3 PSYCHIATRIC:  Normal affect   ASSESSMENT:    1. Prosthetic valve dysfunction, sequela   2. Coronary artery disease involving native heart without angina pectoris, unspecified vessel or lesion type    PLAN:    In order of problems listed above:  Bioprosthetic aortic valve stenosis with acute leaflet thrombosis: diagnosed with this in the setting of chest pain and NSTEMI while living in [place]. TEE showed bioprosthetic aoritc stenosis with a mean gradient of 30 mm hg. Placed on Eliquis. Follow up echo today shows normal EF with mean gradient back to baseline ~ 20mm hg. Will continue him on Eliquis at this time. Okay to stop aspirin. Will change clinda to azithromycin for SBE prophylaxis given higher risk of Cdiff with clinda. I will see him back in 1 year with an echo. We may consider stopping eliquis and restarting aspirin at that time.   CAD: cath in [place] showed mod non-obst CAD. Recently started on Crestor. Feels like he has brain fog and constipation from this. Lipitor has caused myalgias in past. Discussed trying a new statin or referral to lipid clinic but he would like to hold off for now.   Medication Adjustments/Labs and Tests Ordered: Current medicines are reviewed at length with the patient today.  Concerns regarding medicines are outlined above.  Orders Placed This Encounter  Procedures  . ECHOCARDIOGRAM COMPLETE  Meds ordered this encounter  Medications  . azithromycin (ZITHROMAX) 500 MG tablet     Sig: Take one tablet by mouth 1 hour prior to dental procedures    Dispense:  1 tablet    Refill:  12    Patient Instructions  Medication Instructions:  Your physician has recommended you make the following change in your medication:  1-STOP Aspirin 2-TAKE Azithromycin 500 mg by mouth 1 hour prior to dental procedures.  *If you need a refill on your cardiac medications before your next appointment, please call your pharmacy*  Lab Work: If you have labs (blood work) drawn today and your tests are completely normal, you will receive your results only by: Marland Kitchen MyChart Message (if you have MyChart) OR . A paper copy in the mail If you have any lab test that is abnormal or we need to change your treatment, we will call you to review the results.  Testing/Procedures: Your physician has requested that you have an echocardiogram in 1 year. Echocardiography is a painless test that uses sound waves to create images of your heart. It provides your doctor with information about the size and shape of your heart and how well your heart's chambers and valves are working. This procedure takes approximately one hour. There are no restrictions for this procedure.  Follow-Up: At [facility], you and your health needs are our priority.  As part of our continuing mission to provide you with exceptional heart care, we have created designated Provider Care Teams.  These Care Teams include your primary Cardiologist (physician) and Advanced Practice Providers (APPs -  Physician Assistants and Nurse Practitioners) who all work together to provide you with the care you need, when you need it.  We recommend signing up for the patient portal called "MyChart".  Sign up information is provided on this After Visit Summary.  MyChart is used to connect with patients for Virtual Visits (Telemedicine).  Patients are able to view lab/test results, encounter notes, upcoming appointments, etc.  Non-urgent messages can be sent to  your provider as well.   To learn more about what you can do with MyChart, go to [email or web address].    Your next appointment:   1 year(s)  The format for your next appointment:   In Person  Provider:   Carlean Jews, PA-C        Signed, Cline Crock, PA-C  11/05/2020 5:12 PM    [facility]

## 2020-11-05 ENCOUNTER — Ambulatory Visit (HOSPITAL_COMMUNITY): Payer: Medicare HMO | Attending: Cardiology

## 2020-11-05 ENCOUNTER — Encounter: Payer: Self-pay | Admitting: Physician Assistant

## 2020-11-05 ENCOUNTER — Other Ambulatory Visit: Payer: Self-pay

## 2020-11-05 ENCOUNTER — Ambulatory Visit: Payer: Medicare HMO | Admitting: Physician Assistant

## 2020-11-05 VITALS — BP 134/76 | HR 67 | Ht 71.0 in | Wt 228.4 lb

## 2020-11-05 DIAGNOSIS — I2510 Atherosclerotic heart disease of native coronary artery without angina pectoris: Secondary | ICD-10-CM | POA: Diagnosis not present

## 2020-11-05 DIAGNOSIS — Z953 Presence of xenogenic heart valve: Secondary | ICD-10-CM | POA: Diagnosis not present

## 2020-11-05 DIAGNOSIS — T8209XS Other mechanical complication of heart valve prosthesis, sequela: Secondary | ICD-10-CM

## 2020-11-05 LAB — ECHOCARDIOGRAM COMPLETE
AR max vel: 1.31 cm2
AV Area VTI: 1.46 cm2
AV Area mean vel: 1.2 cm2
AV Mean grad: 20 mmHg
AV Peak grad: 36.7 mmHg
Ao pk vel: 3.03 m/s
Area-P 1/2: 2.38 cm2
MV VTI: 1.67 cm2
S' Lateral: 2.6 cm

## 2020-11-05 MED ORDER — AZITHROMYCIN 500 MG PO TABS: ORAL_TABLET | ORAL | 12 refills | Status: AC

## 2020-11-05 NOTE — Patient Instructions (Addendum)
Medication Instructions:  Your physician has recommended you make the following change in your medication:  1-STOP Aspirin 2-TAKE Azithromycin 500 mg by mouth 1 hour prior to dental procedures.  *If you need a refill on your cardiac medications before your next appointment, please call your pharmacy*  Lab Work: If you have labs (blood work) drawn today and your tests are completely normal, you will receive your results only by: Marland Kitchen MyChart Message (if you have MyChart) OR . A paper copy in the mail If you have any lab test that is abnormal or we need to change your treatment, we will call you to review the results.  Testing/Procedures: Your physician has requested that you have an echocardiogram in 1 year. Echocardiography is a painless test that uses sound waves to create images of your heart. It provides your doctor with information about the size and shape of your heart and how well your heart's chambers and valves are working. This procedure takes approximately one hour. There are no restrictions for this procedure.  Follow-Up: At [facility], you and your health needs are our priority.  As part of our continuing mission to provide you with exceptional heart care, we have created designated Provider Care Teams.  These Care Teams include your primary Cardiologist (physician) and Advanced Practice Providers (APPs -  Physician Assistants and Nurse Practitioners) who all work together to provide you with the care you need, when you need it.  We recommend signing up for the patient portal called "MyChart".  Sign up information is provided on this After Visit Summary.  MyChart is used to connect with patients for Virtual Visits (Telemedicine).  Patients are able to view lab/test results, encounter notes, upcoming appointments, etc.  Non-urgent messages can be sent to your provider as well.   To learn more about what you can do with MyChart, go to [email or web address].    Your next  appointment:   1 year(s)  The format for your next appointment:   In Person  Provider:   Nell Range, PA-C

## 2020-11-07 DIAGNOSIS — C4490 Unspecified malignant neoplasm of skin, unspecified: Secondary | ICD-10-CM | POA: Insufficient documentation

## 2020-11-07 DIAGNOSIS — I350 Nonrheumatic aortic (valve) stenosis: Secondary | ICD-10-CM | POA: Insufficient documentation

## 2020-11-07 DIAGNOSIS — M5030 Other cervical disc degeneration, unspecified cervical region: Secondary | ICD-10-CM | POA: Insufficient documentation

## 2020-11-07 DIAGNOSIS — Z973 Presence of spectacles and contact lenses: Secondary | ICD-10-CM | POA: Insufficient documentation

## 2020-11-07 DIAGNOSIS — K5790 Diverticulosis of intestine, part unspecified, without perforation or abscess without bleeding: Secondary | ICD-10-CM | POA: Insufficient documentation

## 2020-11-19 DIAGNOSIS — Z125 Encounter for screening for malignant neoplasm of prostate: Secondary | ICD-10-CM | POA: Diagnosis not present

## 2020-11-19 DIAGNOSIS — D582 Other hemoglobinopathies: Secondary | ICD-10-CM | POA: Diagnosis not present

## 2020-11-19 DIAGNOSIS — R7301 Impaired fasting glucose: Secondary | ICD-10-CM | POA: Diagnosis not present

## 2020-11-19 DIAGNOSIS — E291 Testicular hypofunction: Secondary | ICD-10-CM | POA: Diagnosis not present

## 2020-11-19 DIAGNOSIS — Z79899 Other long term (current) drug therapy: Secondary | ICD-10-CM | POA: Diagnosis not present

## 2020-11-19 DIAGNOSIS — E039 Hypothyroidism, unspecified: Secondary | ICD-10-CM | POA: Diagnosis not present

## 2020-11-19 DIAGNOSIS — I10 Essential (primary) hypertension: Secondary | ICD-10-CM | POA: Diagnosis not present

## 2020-11-19 DIAGNOSIS — E538 Deficiency of other specified B group vitamins: Secondary | ICD-10-CM | POA: Diagnosis not present

## 2020-11-19 DIAGNOSIS — E785 Hyperlipidemia, unspecified: Secondary | ICD-10-CM | POA: Diagnosis not present

## 2020-11-19 DIAGNOSIS — E559 Vitamin D deficiency, unspecified: Secondary | ICD-10-CM | POA: Diagnosis not present

## 2020-11-21 ENCOUNTER — Encounter: Payer: Self-pay | Admitting: Cardiology

## 2020-11-21 ENCOUNTER — Other Ambulatory Visit: Payer: Self-pay

## 2020-11-21 ENCOUNTER — Ambulatory Visit: Payer: Medicare HMO | Admitting: Cardiology

## 2020-11-21 VITALS — BP 128/78 | HR 78 | Ht 71.0 in | Wt 231.6 lb

## 2020-11-21 DIAGNOSIS — I10 Essential (primary) hypertension: Secondary | ICD-10-CM | POA: Diagnosis not present

## 2020-11-21 DIAGNOSIS — E039 Hypothyroidism, unspecified: Secondary | ICD-10-CM

## 2020-11-21 DIAGNOSIS — I214 Non-ST elevation (NSTEMI) myocardial infarction: Secondary | ICD-10-CM | POA: Diagnosis not present

## 2020-11-21 DIAGNOSIS — I2510 Atherosclerotic heart disease of native coronary artery without angina pectoris: Secondary | ICD-10-CM | POA: Diagnosis not present

## 2020-11-21 DIAGNOSIS — T8209XS Other mechanical complication of heart valve prosthesis, sequela: Secondary | ICD-10-CM

## 2020-11-21 DIAGNOSIS — E7800 Pure hypercholesterolemia, unspecified: Secondary | ICD-10-CM | POA: Diagnosis not present

## 2020-11-21 DIAGNOSIS — Z953 Presence of xenogenic heart valve: Secondary | ICD-10-CM

## 2020-11-21 MED ORDER — ASPIRIN EC 81 MG PO TBEC: 81.0000 mg | DELAYED_RELEASE_TABLET | Freq: Every day | ORAL | 3 refills | Status: AC

## 2020-11-21 MED ORDER — ELIQUIS 5 MG PO TABS: 5.0000 mg | ORAL_TABLET | Freq: Two times a day (BID) | ORAL | 3 refills | Status: DC

## 2020-11-21 NOTE — Progress Notes (Signed)
Cardiology Office Note:    Date:  11/21/2020   ID:  Aaron Foley, DOB 1940/10/17, MRN [id number]  PCP:  Philemon Kingdom, MD  Cardiologist:  Norman Herrlich, MD    Referring MD: Philemon Kingdom, MD    ASSESSMENT:    1. Prosthetic valve dysfunction, sequela   2. S/p TAVR (transcatheter aortic valve replacement), bioprosthetic   3. Coronary artery disease involving native heart without angina pectoris, unspecified vessel or lesion type   4. NSTEMI (non-ST elevated myocardial infarction) (HCC)   5. Essential hypertension   6. Hypercholesterolemia   7. Acquired hypothyroidism    PLAN:    In order of problems listed above:  1. He is doing quite well improvement on his echocardiogram judged by the TAVR team to have responded to anticoagulant therapy and recommended to remain on it. 2. Stable CAD no anginal discomfort on medical therapy he will renew his aspirin and tolerates lipid-lowering therapy with ideal lipids 3. Blood pressure at target normal potassium continue current treatment including ARB 4. TSH at target continue his current supplement   Next appointment: 1 year with me I told her to make arrangements and see the cardiologist in [place] 6 months   Medication Adjustments/Labs and Tests Ordered: Current medicines are reviewed at length with the patient today.  Concerns regarding medicines are outlined above.  No orders of the defined types were placed in this encounter.  No orders of the defined types were placed in this encounter.   Chief Complaint  Patient presents with  . Follow-up  . Coronary Artery Disease  . Hyperlipidemia  . Aortic Stenosis    Status post TAVR with leaflet thrombosis    History of Present Illness:    Aaron Foley is a 80 y.o. male with a hx of aortic stenosis TAVR 11/21/2018 hypertension hyperlipidemia and CAD with non-ST elevation MI 08/02/2020 and findings of prosthetic valve dysfunction with restricted leaflet  excursion.He was initiated on anticoagulant therapy Eliquis 08/06/2020. Left heart catheterization was performed 08/05/2020 and it showed 40 to 50% diffuse left main stenosis with minimal luminal area 6.2 mm 60% ostial LAD stenosis 60% ostial right coronary artery stenosis.  He was admitted to [facility] 08/03/2020 in [place] for non-ST elevation MI he had chest pain troponin was elevated.  He did not have PCI he was found to have leaflet thrombosis TAVR and was anticoagulated and felt to be safe for discharge and follow-up in [place] He was last seen by me 09/18/2020. Compliance with diet, lifestyle and medications: Yes  He is doing well back to full activities has had no angina shortness of breath edema chest pain palpitation tolerates lipid-lowering therapy without muscle pain or weakness and combined antiplatelet anticoagulant without bleeding complication.  I reinforced with him he should be taking both aspirin with ACS and Eliquis.  Recent echocardiogram performed by structural heart 11/05/2020 shows normal left ventricular ejection fraction 65 to 70% with a mean gradient diminished to 20 mmHg.  He was continued on anticoagulant therapy Past Medical History:  Diagnosis Date  . Aortic stenosis   . BPH (benign prostatic hyperplasia) 08/03/2020   Last Assessment & Plan:  Formatting of this note might be different from the original. Chronic S/p urological intervention  On no medications chronically  Plan: -Continue to monitor -Consider flomax if evidence of urinary retention  . Class 1 obesity in adult 08/03/2020   Last Assessment & Plan:  Formatting of this note might be different from the original. BMI 32.42  Complicates all aspects of care  Plan: -Continue to encourage diet and lifestyle modifications resulting in long term weight loss  . Closed left ankle fracture 2014  . Coronary artery disease involving native coronary artery 08/04/2020   Formatting of this note is different from the  original. Left heart cath with Dr. Micheal Likens, 08/05/20: LEFT MAIN coronary artery: Large-caliber artery which divides into LAD and circumflex. It has diffuse up to 40-50% stenosis. LEFT ANTERIOR DESCENDING coronary artery: Large caliber vessel. It gives rise to multiple septal perforators and diagonal arteries. There is dual LAD system. After the origin   . Disc disease, degenerative, cervical   . Diverticulosis    mild left colonic  . Essential hypertension 08/10/2017  . GERD (gastroesophageal reflux disease) 08/03/2020   Last Assessment & Plan:  Formatting of this note might be different from the original. Pt reports chronic States he takes a digestive enzyme capsule TID with meals since his GB removal, states it is not a powder Also takes antacids regularly  Plan: -Daily PPI -ZenPep TID -Tums and Maalox available  . History of aortic stenosis 08/03/2020   Last Assessment & Plan:  Formatting of this note might be different from the original. Post TAVR -  TEE tomorrow  For  reeval  Of  Val - ?  Valve  Dysfunction  Source  Of Sx's - DR Denny Peon evaluating today  . Hormone replacement therapy 08/03/2020   Last Assessment & Plan:  Formatting of this note might be different from the original. Patient reports he uses testosterone cream topically BID Duration of use 7-8 years Initial symptoms: fatigue, low energy  Plan: -Hold HRT at present  . Hypercholesterolemia 08/10/2017  . Hypertension 08/10/2017  . Hypothyroidism 08/10/2017  . Low testosterone in male 08/10/2017  . Low vitamin B12 level 08/10/2017  . Mild aortic stenosis 08/10/2017  . NSTEMI (non-ST elevated myocardial infarction) (HCC) 08/01/2020   Last Assessment & Plan:  Formatting of this note is different from the original. Pt initially presented to [facility] in [place] complaining of tightness in chest with walking EKG non-ischemic Trop: 0.189 ---> 1195 --->1039 S/p plavix load 600mg  po , lovenox 1mg /kg, metoprolol, asa Then taken for heart cath 08/02/20, which revealed 3  vessel disease CTS consulted and recommended transfer to [place] for CAB  . 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 Polycythemia 08/03/2020   Last Assessment & Plan:  Formatting of this note might be different from the original. Chronic - Likely secondary  To  Chronic  Hypoxemia /   Fe  Level - nl so  Unlikely hemachromatosis  -  Home  noc  Pulse oxy  needed  Plan: -Continue to monitor -Consider therapeutic phlebotomy for Hct >60  . S/P TAVR (transcatheter aortic valve replacement)    26 mm Edwards Sapien 3 transcatheter heart valve placed via percutaneous right transfemoral approach   . Severe aortic stenosis   . 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 Vitamin D deficiency 08/10/2017  . Wears glasses    readers    Past Surgical History:  Procedure Laterality Date  . COLONOSCOPY    . INTRAOPERATIVE TRANSTHORACIC ECHOCARDIOGRAM N/A 11/21/2018   Procedure: Intraoperative Transthoracic Echocardiogram;  Surgeon: Kathleene Hazel, MD;  Location: [facility] OR;  Service: Open Heart Surgery;  Laterality: N/A;  . MENISCUS REPAIR Left   . RIGHT/LEFT HEART CATH AND CORONARY ANGIOGRAPHY N/A 10/12/2018   Procedure: RIGHT/LEFT HEART CATH AND CORONARY ANGIOGRAPHY;  Surgeon: Kathleene Hazel, MD;  Location: [facility] INVASIVE CV LAB;  Service: Cardiovascular;  Laterality: N/A;  . TRANSCATHETER AORTIC VALVE REPLACEMENT, TRANSFEMORAL N/A 11/21/2018   Procedure: TRANSCATHETER AORTIC VALVE REPLACEMENT, TRANSFEMORAL;  Surgeon: Kathleene Hazel, MD;  Location: [facility] OR;  Service: Open Heart Surgery;  Laterality: N/A;  . UMBILICAL HERNIA REPAIR      Current Medications: Current Meds  Medication Sig  . anastrozole (ARIMIDEX) 1 MG tablet Take 0.25 mg by mouth 2 (two) times a week. Pt states he is taking 1/4 of a tablet 2 times a week  . ARMOUR THYROID 30 MG tablet Take 30 mg by mouth daily before breakfast.   . azithromycin (ZITHROMAX) 500 MG tablet Take one tablet by mouth 1 hour  prior to dental procedures  . cholecalciferol (VITAMIN D3) 25 MCG (1000 UT) tablet Take 1,000 Units by mouth daily.  Marland Kitchen ELIQUIS 5 MG TABS tablet Take 5 mg by mouth 2 (two) times daily.  . Glucosamine-MSM-Hyaluronic Acd (JOINT HEALTH PO) Take 1 capsule by mouth daily.  . Multiple Vitamins-Minerals (MULTIVITAMIN WITH MINERALS) tablet Take 1 tablet by mouth daily.  . nitroGLYCERIN (NITROSTAT) 0.4 MG SL tablet Place 0.4 mg under the tongue as directed.  . rosuvastatin (CRESTOR) 10 MG tablet Take 1 tablet (10 mg total) by mouth daily.  . sildenafil (REVATIO) 20 MG tablet Take 20 mg by mouth daily as needed (ED).   Marland Kitchen telmisartan (MICARDIS) 40 MG tablet Take 60 mg by mouth at bedtime.   . Testosterone 20 % CREA Apply 1 application topically daily.   . vitamin C (ASCORBIC ACID) 500 MG tablet Take 1,000 mg by mouth daily.      Allergies:   Penicillins and Lipitor [atorvastatin]   Social History   Socioeconomic History  . Marital status: Married    Spouse name: Not on file  . Number of children: Not on file  . Years of education: Not on file  . Highest education level: Not on file  Occupational History  . Not on file  Tobacco Use  . Smoking status: Former Smoker    Packs/day: 1.00    Years: 10.00    Pack years: 10.00    Types: Cigarettes    Quit date: 1969    Years since quitting: 53.3  . Smokeless tobacco: Never Used  Vaping Use  . Vaping Use: Never used  Substance and Sexual Activity  . Alcohol use: Not Currently  . Drug use: No  . Sexual activity: Not on file  Other Topics Concern  . Not on file  Social History Narrative  . Not on file   Social Determinants of Health   Financial Resource Strain: Not on file  Food Insecurity: Not on file  Transportation Needs: Not on file  Physical Activity: Not on file  Stress: Not on file  Social Connections: Not on file     Family History: The patient's family history includes CAD in his paternal grandfather; Diabetes in his maternal  grandmother; Heart disease in his father; Hypertension in his maternal grandmother; Leukemia in his mother. ROS:   Please see the history of present illness.    All other systems reviewed and are negative.  EKGs/Labs/Other Studies Reviewed:    The following studies were reviewed today:    Recent Labs: Performed yesterday hemoglobin 18.3 creatinine 1.19 potassium 4.2 normal liver function test lipids ideal cholesterol 97 LDL 51 triglycerides 88 HDL 28 normal 1.38 Recent Lipid Panel    Component Value Date/Time  CHOL 118 10/13/2020 0937   TRIG 158 (H) 10/13/2020 0937   HDL 32 (L) 10/13/2020 0937   CHOLHDL 3.7 10/13/2020 0937   LDLCALC 59 10/13/2020 0937    Physical Exam:    VS:  BP 128/78   Pulse 78   Ht 5\' 11"  (1.803 m)   Wt 231 lb 9.6 oz (105.1 kg)   SpO2 98%   BMI 32.30 kg/m     Wt Readings from Last 3 Encounters:  11/21/20 231 lb 9.6 oz (105.1 kg)  11/05/20 228 lb 6.4 oz (103.6 kg)  09/18/20 227 lb 12.8 oz (103.3 kg)     GEN:  Well nourished, well developed in no acute distress HEENT: Normal NECK: No JVD; No carotid bruits LYMPHATICS: No lymphadenopathy CARDIAC: Grade 1/6 to 2/6 midsystolic ejection murmur S2 splits does not radiate to the carotids no AR RRR, no murmurs, rubs, gallops RESPIRATORY:  Clear to auscultation without rales, wheezing or rhonchi  ABDOMEN: Soft, non-tender, non-distended MUSCULOSKELETAL:  No edema; No deformity  SKIN: Warm and dry NEUROLOGIC:  Alert and oriented x 3 PSYCHIATRIC:  Normal affect    Signed, Norman Herrlich, MD  11/21/2020 2:04 PM    [facility]

## 2020-11-21 NOTE — Patient Instructions (Signed)
Medication Instructions:  Your physician has recommended you make the following change in your medication:  RESTART: Aspirin 81 mg take one tablet by mouth daily.  *If you need a refill on your cardiac medications before your next appointment, please call your pharmacy*   Lab Work: None If you have labs (blood work) drawn today and your tests are completely normal, you will receive your results only by: Marland Kitchen MyChart Message (if you have MyChart) OR . A paper copy in the mail If you have any lab test that is abnormal or we need to change your treatment, we will call you to review the results.   Testing/Procedures: None   Follow-Up: At [facility], you and your health needs are our priority.  As part of our continuing mission to provide you with exceptional heart care, we have created designated Provider Care Teams.  These Care Teams include your primary Cardiologist (physician) and Advanced Practice Providers (APPs -  Physician Assistants and Nurse Practitioners) who all work together to provide you with the care you need, when you need it.  We recommend signing up for the patient portal called "MyChart".  Sign up information is provided on this After Visit Summary.  MyChart is used to connect with patients for Virtual Visits (Telemedicine).  Patients are able to view lab/test results, encounter notes, upcoming appointments, etc.  Non-urgent messages can be sent to your provider as well.   To learn more about what you can do with MyChart, go to [email or web address].    Your next appointment:   1 year(s)  The format for your next appointment:   In Person  Provider:   Shirlee More, MD   Other Instructions

## 2020-11-21 NOTE — Addendum Note (Signed)
Addended by: Resa Miner I on: 11/21/2020 02:09 PM   Modules accepted: Orders

## 2020-11-26 DIAGNOSIS — I10 Essential (primary) hypertension: Secondary | ICD-10-CM | POA: Diagnosis not present

## 2020-11-26 DIAGNOSIS — Z1339 Encounter for screening examination for other mental health and behavioral disorders: Secondary | ICD-10-CM | POA: Diagnosis not present

## 2020-11-26 DIAGNOSIS — Z0000 Encounter for general adult medical examination without abnormal findings: Secondary | ICD-10-CM | POA: Diagnosis not present

## 2020-11-26 DIAGNOSIS — E039 Hypothyroidism, unspecified: Secondary | ICD-10-CM | POA: Diagnosis not present

## 2020-11-26 DIAGNOSIS — Z1331 Encounter for screening for depression: Secondary | ICD-10-CM | POA: Diagnosis not present

## 2020-11-26 DIAGNOSIS — D582 Other hemoglobinopathies: Secondary | ICD-10-CM | POA: Diagnosis not present

## 2020-11-26 DIAGNOSIS — E785 Hyperlipidemia, unspecified: Secondary | ICD-10-CM | POA: Diagnosis not present

## 2020-11-26 DIAGNOSIS — E291 Testicular hypofunction: Secondary | ICD-10-CM | POA: Diagnosis not present

## 2020-11-26 DIAGNOSIS — M542 Cervicalgia: Secondary | ICD-10-CM | POA: Diagnosis not present

## 2020-11-26 DIAGNOSIS — I2510 Atherosclerotic heart disease of native coronary artery without angina pectoris: Secondary | ICD-10-CM | POA: Diagnosis not present

## 2020-12-31 DIAGNOSIS — U071 COVID-19: Secondary | ICD-10-CM | POA: Diagnosis not present

## 2021-02-24 DIAGNOSIS — E291 Testicular hypofunction: Secondary | ICD-10-CM | POA: Diagnosis not present

## 2021-02-24 DIAGNOSIS — D582 Other hemoglobinopathies: Secondary | ICD-10-CM | POA: Diagnosis not present

## 2021-02-24 DIAGNOSIS — Z79899 Other long term (current) drug therapy: Secondary | ICD-10-CM | POA: Diagnosis not present

## 2021-04-15 DIAGNOSIS — L249 Irritant contact dermatitis, unspecified cause: Secondary | ICD-10-CM | POA: Diagnosis not present

## 2021-04-15 DIAGNOSIS — Z85828 Personal history of other malignant neoplasm of skin: Secondary | ICD-10-CM | POA: Diagnosis not present

## 2021-04-15 DIAGNOSIS — L570 Actinic keratosis: Secondary | ICD-10-CM | POA: Diagnosis not present

## 2021-04-15 DIAGNOSIS — D485 Neoplasm of uncertain behavior of skin: Secondary | ICD-10-CM | POA: Diagnosis not present

## 2021-04-20 DIAGNOSIS — R7989 Other specified abnormal findings of blood chemistry: Secondary | ICD-10-CM | POA: Diagnosis not present

## 2021-05-06 DIAGNOSIS — R7303 Prediabetes: Secondary | ICD-10-CM | POA: Diagnosis not present

## 2021-05-06 DIAGNOSIS — Z952 Presence of prosthetic heart valve: Secondary | ICD-10-CM | POA: Diagnosis not present

## 2021-05-06 DIAGNOSIS — D751 Secondary polycythemia: Secondary | ICD-10-CM | POA: Diagnosis not present

## 2021-05-06 DIAGNOSIS — E039 Hypothyroidism, unspecified: Secondary | ICD-10-CM | POA: Diagnosis not present

## 2021-05-06 DIAGNOSIS — R55 Syncope and collapse: Secondary | ICD-10-CM | POA: Diagnosis not present

## 2021-05-06 DIAGNOSIS — E291 Testicular hypofunction: Secondary | ICD-10-CM | POA: Diagnosis not present

## 2021-05-07 DIAGNOSIS — E291 Testicular hypofunction: Secondary | ICD-10-CM | POA: Diagnosis not present

## 2021-05-07 DIAGNOSIS — R7303 Prediabetes: Secondary | ICD-10-CM | POA: Diagnosis not present

## 2021-05-07 DIAGNOSIS — I351 Nonrheumatic aortic (valve) insufficiency: Secondary | ICD-10-CM | POA: Diagnosis not present

## 2021-05-07 DIAGNOSIS — D751 Secondary polycythemia: Secondary | ICD-10-CM | POA: Diagnosis not present

## 2021-05-11 DIAGNOSIS — I25110 Atherosclerotic heart disease of native coronary artery with unstable angina pectoris: Secondary | ICD-10-CM | POA: Diagnosis not present

## 2021-05-11 DIAGNOSIS — I350 Nonrheumatic aortic (valve) stenosis: Secondary | ICD-10-CM | POA: Diagnosis not present

## 2021-05-11 DIAGNOSIS — I10 Essential (primary) hypertension: Secondary | ICD-10-CM | POA: Diagnosis not present

## 2021-05-11 DIAGNOSIS — E785 Hyperlipidemia, unspecified: Secondary | ICD-10-CM | POA: Diagnosis not present

## 2021-05-12 DIAGNOSIS — D751 Secondary polycythemia: Secondary | ICD-10-CM | POA: Diagnosis not present

## 2021-05-26 DIAGNOSIS — Z953 Presence of xenogenic heart valve: Secondary | ICD-10-CM | POA: Diagnosis not present

## 2021-05-26 DIAGNOSIS — I350 Nonrheumatic aortic (valve) stenosis: Secondary | ICD-10-CM | POA: Diagnosis not present

## 2021-05-26 DIAGNOSIS — I082 Rheumatic disorders of both aortic and tricuspid valves: Secondary | ICD-10-CM | POA: Diagnosis not present

## 2021-06-09 DIAGNOSIS — D751 Secondary polycythemia: Secondary | ICD-10-CM | POA: Diagnosis not present

## 2021-06-10 DIAGNOSIS — D751 Secondary polycythemia: Secondary | ICD-10-CM | POA: Diagnosis not present

## 2021-06-10 DIAGNOSIS — Z952 Presence of prosthetic heart valve: Secondary | ICD-10-CM | POA: Diagnosis not present

## 2021-06-10 DIAGNOSIS — E291 Testicular hypofunction: Secondary | ICD-10-CM | POA: Diagnosis not present

## 2021-06-17 DIAGNOSIS — I10 Essential (primary) hypertension: Secondary | ICD-10-CM | POA: Diagnosis not present

## 2021-06-17 DIAGNOSIS — I2510 Atherosclerotic heart disease of native coronary artery without angina pectoris: Secondary | ICD-10-CM | POA: Diagnosis not present

## 2021-06-17 DIAGNOSIS — E785 Hyperlipidemia, unspecified: Secondary | ICD-10-CM | POA: Diagnosis not present

## 2021-06-17 DIAGNOSIS — I350 Nonrheumatic aortic (valve) stenosis: Secondary | ICD-10-CM | POA: Diagnosis not present

## 2021-06-22 DIAGNOSIS — Z85828 Personal history of other malignant neoplasm of skin: Secondary | ICD-10-CM | POA: Diagnosis not present

## 2021-06-22 DIAGNOSIS — L280 Lichen simplex chronicus: Secondary | ICD-10-CM | POA: Diagnosis not present

## 2021-06-22 DIAGNOSIS — L570 Actinic keratosis: Secondary | ICD-10-CM | POA: Diagnosis not present

## 2021-06-22 DIAGNOSIS — D485 Neoplasm of uncertain behavior of skin: Secondary | ICD-10-CM | POA: Diagnosis not present

## 2021-06-22 DIAGNOSIS — D23121 Other benign neoplasm of skin of left upper eyelid, including canthus: Secondary | ICD-10-CM | POA: Diagnosis not present

## 2021-07-09 DIAGNOSIS — D751 Secondary polycythemia: Secondary | ICD-10-CM | POA: Diagnosis not present

## 2021-08-10 ENCOUNTER — Other Ambulatory Visit: Payer: Self-pay | Admitting: Cardiology

## 2021-08-11 DIAGNOSIS — D751 Secondary polycythemia: Secondary | ICD-10-CM | POA: Diagnosis not present

## 2021-08-26 DIAGNOSIS — L433 Subacute (active) lichen planus: Secondary | ICD-10-CM | POA: Diagnosis not present

## 2021-08-26 DIAGNOSIS — L249 Irritant contact dermatitis, unspecified cause: Secondary | ICD-10-CM | POA: Diagnosis not present

## 2021-08-26 DIAGNOSIS — L814 Other melanin hyperpigmentation: Secondary | ICD-10-CM | POA: Diagnosis not present

## 2021-08-26 DIAGNOSIS — D485 Neoplasm of uncertain behavior of skin: Secondary | ICD-10-CM | POA: Diagnosis not present

## 2021-09-08 DIAGNOSIS — D751 Secondary polycythemia: Secondary | ICD-10-CM | POA: Diagnosis not present

## 2021-09-24 DIAGNOSIS — Z953 Presence of xenogenic heart valve: Secondary | ICD-10-CM | POA: Diagnosis not present

## 2021-09-24 DIAGNOSIS — I10 Essential (primary) hypertension: Secondary | ICD-10-CM | POA: Diagnosis not present

## 2021-09-24 DIAGNOSIS — E785 Hyperlipidemia, unspecified: Secondary | ICD-10-CM | POA: Diagnosis not present

## 2021-09-24 DIAGNOSIS — I2510 Atherosclerotic heart disease of native coronary artery without angina pectoris: Secondary | ICD-10-CM | POA: Diagnosis not present

## 2021-10-03 DIAGNOSIS — M67912 Unspecified disorder of synovium and tendon, left shoulder: Secondary | ICD-10-CM | POA: Diagnosis not present

## 2021-10-22 ENCOUNTER — Other Ambulatory Visit: Payer: Self-pay | Admitting: Cardiology

## 2021-10-22 DIAGNOSIS — M67912 Unspecified disorder of synovium and tendon, left shoulder: Secondary | ICD-10-CM | POA: Diagnosis not present

## 2021-10-22 NOTE — Telephone Encounter (Signed)
Prescription refill request for Eliquis received. ?Indication:TAVR ?Last office visit:2/23 ?Scr:1.1 ?Age: 81 ?Weight:105.1 kg ? ?Prescription refilled ? ?

## 2021-10-23 DIAGNOSIS — Z125 Encounter for screening for malignant neoplasm of prostate: Secondary | ICD-10-CM | POA: Diagnosis not present

## 2021-10-23 DIAGNOSIS — D582 Other hemoglobinopathies: Secondary | ICD-10-CM | POA: Diagnosis not present

## 2021-10-23 DIAGNOSIS — E291 Testicular hypofunction: Secondary | ICD-10-CM | POA: Diagnosis not present

## 2021-10-23 DIAGNOSIS — Z79899 Other long term (current) drug therapy: Secondary | ICD-10-CM | POA: Diagnosis not present

## 2021-10-23 DIAGNOSIS — E559 Vitamin D deficiency, unspecified: Secondary | ICD-10-CM | POA: Diagnosis not present

## 2021-10-23 DIAGNOSIS — R7301 Impaired fasting glucose: Secondary | ICD-10-CM | POA: Diagnosis not present

## 2021-10-23 DIAGNOSIS — E538 Deficiency of other specified B group vitamins: Secondary | ICD-10-CM | POA: Diagnosis not present

## 2021-10-23 DIAGNOSIS — E785 Hyperlipidemia, unspecified: Secondary | ICD-10-CM | POA: Diagnosis not present

## 2021-10-23 DIAGNOSIS — R7989 Other specified abnormal findings of blood chemistry: Secondary | ICD-10-CM | POA: Diagnosis not present

## 2021-10-23 DIAGNOSIS — E039 Hypothyroidism, unspecified: Secondary | ICD-10-CM | POA: Diagnosis not present

## 2021-10-23 DIAGNOSIS — I10 Essential (primary) hypertension: Secondary | ICD-10-CM | POA: Diagnosis not present

## 2021-10-24 DIAGNOSIS — K5732 Diverticulitis of large intestine without perforation or abscess without bleeding: Secondary | ICD-10-CM | POA: Diagnosis not present

## 2021-10-24 DIAGNOSIS — I700 Atherosclerosis of aorta: Secondary | ICD-10-CM | POA: Diagnosis not present

## 2021-10-24 DIAGNOSIS — Z20822 Contact with and (suspected) exposure to covid-19: Secondary | ICD-10-CM | POA: Diagnosis not present

## 2021-10-24 DIAGNOSIS — R1012 Left upper quadrant pain: Secondary | ICD-10-CM | POA: Diagnosis not present

## 2021-10-24 DIAGNOSIS — K5792 Diverticulitis of intestine, part unspecified, without perforation or abscess without bleeding: Secondary | ICD-10-CM | POA: Diagnosis not present

## 2021-10-24 DIAGNOSIS — R1032 Left lower quadrant pain: Secondary | ICD-10-CM | POA: Diagnosis not present

## 2021-10-28 DIAGNOSIS — M791 Myalgia, unspecified site: Secondary | ICD-10-CM | POA: Diagnosis not present

## 2021-10-28 DIAGNOSIS — K5792 Diverticulitis of intestine, part unspecified, without perforation or abscess without bleeding: Secondary | ICD-10-CM | POA: Diagnosis not present

## 2021-10-28 DIAGNOSIS — Z6832 Body mass index (BMI) 32.0-32.9, adult: Secondary | ICD-10-CM | POA: Diagnosis not present

## 2021-10-30 DIAGNOSIS — Z6832 Body mass index (BMI) 32.0-32.9, adult: Secondary | ICD-10-CM | POA: Diagnosis not present

## 2021-10-30 DIAGNOSIS — Z1331 Encounter for screening for depression: Secondary | ICD-10-CM | POA: Diagnosis not present

## 2021-10-30 DIAGNOSIS — I10 Essential (primary) hypertension: Secondary | ICD-10-CM | POA: Diagnosis not present

## 2021-10-30 DIAGNOSIS — I2510 Atherosclerotic heart disease of native coronary artery without angina pectoris: Secondary | ICD-10-CM | POA: Diagnosis not present

## 2021-10-30 DIAGNOSIS — E785 Hyperlipidemia, unspecified: Secondary | ICD-10-CM | POA: Diagnosis not present

## 2021-10-30 DIAGNOSIS — B001 Herpesviral vesicular dermatitis: Secondary | ICD-10-CM | POA: Diagnosis not present

## 2021-10-30 DIAGNOSIS — D751 Secondary polycythemia: Secondary | ICD-10-CM | POA: Diagnosis not present

## 2021-10-30 DIAGNOSIS — E039 Hypothyroidism, unspecified: Secondary | ICD-10-CM | POA: Diagnosis not present

## 2021-10-30 DIAGNOSIS — I700 Atherosclerosis of aorta: Secondary | ICD-10-CM | POA: Diagnosis not present

## 2021-10-30 DIAGNOSIS — D582 Other hemoglobinopathies: Secondary | ICD-10-CM | POA: Diagnosis not present

## 2021-10-30 DIAGNOSIS — Z0000 Encounter for general adult medical examination without abnormal findings: Secondary | ICD-10-CM | POA: Diagnosis not present

## 2021-11-04 ENCOUNTER — Ambulatory Visit: Payer: Medicare HMO | Admitting: Physician Assistant

## 2021-11-04 ENCOUNTER — Ambulatory Visit (HOSPITAL_COMMUNITY): Payer: Medicare HMO | Attending: Physician Assistant

## 2021-11-04 VITALS — BP 132/76 | HR 64 | Ht 70.0 in | Wt 224.4 lb

## 2021-11-04 DIAGNOSIS — T8209XA Other mechanical complication of heart valve prosthesis, initial encounter: Secondary | ICD-10-CM

## 2021-11-04 DIAGNOSIS — I252 Old myocardial infarction: Secondary | ICD-10-CM | POA: Diagnosis not present

## 2021-11-04 DIAGNOSIS — D751 Secondary polycythemia: Secondary | ICD-10-CM

## 2021-11-04 DIAGNOSIS — T8209XD Other mechanical complication of heart valve prosthesis, subsequent encounter: Secondary | ICD-10-CM | POA: Diagnosis not present

## 2021-11-04 DIAGNOSIS — T8209XS Other mechanical complication of heart valve prosthesis, sequela: Secondary | ICD-10-CM | POA: Diagnosis not present

## 2021-11-04 DIAGNOSIS — I10 Essential (primary) hypertension: Secondary | ICD-10-CM | POA: Insufficient documentation

## 2021-11-04 DIAGNOSIS — I351 Nonrheumatic aortic (valve) insufficiency: Secondary | ICD-10-CM | POA: Diagnosis not present

## 2021-11-04 DIAGNOSIS — I2510 Atherosclerotic heart disease of native coronary artery without angina pectoris: Secondary | ICD-10-CM | POA: Diagnosis not present

## 2021-11-04 DIAGNOSIS — X58XXXS Exposure to other specified factors, sequela: Secondary | ICD-10-CM | POA: Diagnosis not present

## 2021-11-04 DIAGNOSIS — Z952 Presence of prosthetic heart valve: Secondary | ICD-10-CM

## 2021-11-04 LAB — ECHOCARDIOGRAM COMPLETE
AR max vel: 2.46 cm2
AV Area VTI: 2.52 cm2
AV Area mean vel: 2.36 cm2
AV Mean grad: 8.3 mmHg
AV Peak grad: 15.8 mmHg
Ao pk vel: 1.99 m/s
Area-P 1/2: 2.69 cm2
P 1/2 time: 630 msec
S' Lateral: 3.1 cm

## 2021-11-04 NOTE — Progress Notes (Signed)
?[facility]   ?MULTIDISCIPLINARY HEART VALVE CLINIC ?                                    ?Cardiology Office Note:   ? ?Date:  11/05/2021  ? ?ID:  Aaron Foley, DOB 1940/12/10, MRN [id number] ? ?PCP:  Aaron Kingdom, MD  ?[facility] Cardiologist:  Aaron Herrlich, MD / Dr. Clifton Foley & Dr. Cornelius Foley (TAVR) ?[facility] Electrophysiologist:  None  ? ?Referring MD: Aaron Kingdom, MD  ? ?Follow up TAVR with leaflet thrombosis on Eliquis ? ?History of Present Illness:   ? ?Aaron Foley is a 81 y.o. male with a hx of  HTN, HLD, hypothyroidism, and severe AS s/p TAVR (11/21/18) with subsequent leaflet thrombosis on Eliquis who presents for follow up.  ?  ?He was diagnosed with severe AS in 2020. Pre op catheterization revealed mild nonobstructive coronary artery disease and confirmed the presence of severe aortic stenosis. He underwent successful TAVR with a 26 mm Edwards Sapien 3 THV via the TF approach on 11/21/18. Post operative echo showed EF 65%, poorly visualized TAVR with elevated mean gradient of 24 mm Hg and no PVL. He was discharged on POD1 with aspirin and plavix. 1 month echo showed echo 11/21/19 showed EF 65%, normally functioning TAVR with a mean gradient of 19 mmHg (improved from 24 mm Hg) and trivial PVL.  ? ?He did quite well in follow up and was able to travel back and forth to [place] with his wife. He was admitted to [facility] 08/03/2020 in [place] for NSTEMI. Echo at that time showed EF 60%, abnormal aortic valve velocity peak 50 mmHg and mean 30 mm hg.  He underwent a TEE which showed an abnormal  aortic valve with restricted leaflet excursion and severe bioprosthetic aortic valve stenosis. Left heart catheterization was performed 08/05/2020 and it showed 40 to 50% diffuse left main stenosis with minimal luminal area 6.2 mm?, 60% ostial LAD stenosis, 60% ostial right coronary artery stenosis. There was no indication for percutaneous intervention and was started  on Eliquis for acute leaflet thrombosis.  ? ?He established care with Aaron Foley in [place], [place]. Had an echo in 05/2021 that showed EF 56%, normally functioning TAVR with a mean gradient of 20 mm hg and mild AI.  ? ?Today he presents to clinic for follow up. Her with his wife. Had a recent bout of diverticulitis but otherwise doing well. No CP or SOB. No LE edema, orthopnea or PND. No dizziness or syncope. No blood in stool or urine. No palpitations. Stopped Aaron Foley. Mostly lives in [place] but comes back to [facility] for doctors appointments. Had recent labs with Dr. Sudie Foley from 10/24/21 which I reviewed:  LDL 62, Hg 16.9, Hct 52.1, Creat 1.11, K 4.3 and NA 139. Testosterone was with in a normal range but previously above normal >1200. ? ? ?Past Medical History:  ?Diagnosis Date  ? Aortic stenosis   ? BPH (benign prostatic hyperplasia) 08/03/2020  ? Last Assessment & Plan:  Formatting of this note might be different from the original. Chronic S/p urological intervention  On no medications chronically  Plan: -Continue to monitor -Consider flomax if evidence of urinary retention  ? Class 1 obesity in adult 08/03/2020  ? Last Assessment & Plan:  Formatting of this note might be different from the original. BMI 32.42 Complicates all aspects of care  Plan: -Continue  to encourage diet and lifestyle modifications resulting in long term weight loss  ? Closed left ankle fracture 2014  ? Coronary artery disease involving native coronary artery 08/04/2020  ? Formatting of this note is different from the original. Left heart cath with Dr. Micheal Likens, 08/05/20: LEFT MAIN coronary artery: Large-caliber artery which divides into LAD and circumflex.  It has diffuse up to 40-50% stenosis. LEFT ANTERIOR DESCENDING coronary artery: Large caliber vessel. It gives rise to multiple septal perforators and diagonal arteries.  There is dual LAD system.  After the origin   ? Disc disease, degenerative, cervical   ? Diverticulosis   ? mild left colonic  ?  Essential hypertension 08/10/2017  ? GERD (gastroesophageal reflux disease) 08/03/2020  ? Last Assessment & Plan:  Formatting of this note might be different from the original. Pt reports chronic States he takes a digestive enzyme capsule TID with meals since his GB removal, states it is not a powder Also takes antacids regularly  Plan: -Daily PPI -ZenPep TID -Tums and Maalox available  ? History of aortic stenosis 08/03/2020  ? Last Assessment & Plan:  Formatting of this note might be different from the original. Post TAVR -  TEE tomorrow  For  reeval  Of  Val - ?  Valve  Dysfunction  Source  Of Sx's - DR Denny Peon evaluating today  ? Hormone replacement therapy 08/03/2020  ? Last Assessment & Plan:  Formatting of this note might be different from the original. Patient reports he uses testosterone cream topically BID Duration of use 7-8 years Initial symptoms: fatigue, low energy  Plan: -Hold HRT at present  ? Hypercholesterolemia 08/10/2017  ? Hypertension 08/10/2017  ? Hypothyroidism 08/10/2017  ? Low testosterone in male 08/10/2017  ? Low vitamin B12 level 08/10/2017  ? Mild aortic stenosis 08/10/2017  ? NSTEMI (non-ST elevated myocardial infarction) (HCC) 08/01/2020  ? Last Assessment & Plan:  Formatting of this note is different from the original. Pt initially presented to [facility] in [place] complaining of tightness in chest with walking EKG non-ischemic Trop: 0.189 ---> 1195 --->1039 S/p plavix load 600mg  po , lovenox 1mg /kg, metoprolol, asa  Then taken for heart cath 08/02/20, which revealed 3 vessel disease CTS consulted and recommended transfer to [place] for CAB  ? Penis disorder 08/10/2017  ? Perione's disease/penile fibrosis   ? Polycythemia 08/03/2020  ? Last Assessment & Plan:  Formatting of this note might be different from the original. Chronic - Likely secondary  To  Chronic  Hypoxemia /   Fe  Level - nl so  Unlikely hemachromatosis  -  Home  noc  Pulse oxy  needed  Plan: -Continue to monitor -Consider therapeutic phlebotomy for  Hct >60  ? S/P TAVR (transcatheter aortic valve replacement)   ? 26 mm Edwards Sapien 3 transcatheter heart valve placed via percutaneous right transfemoral approach   ? Severe aortic stenosis   ? Skin cancer   ? on face/forehead basil cell  ? Vitamin D deficiency 08/10/2017  ? Wears glasses   ? readers  ? ? ?Past Surgical History:  ?Procedure Laterality Date  ? COLONOSCOPY    ? INTRAOPERATIVE TRANSTHORACIC ECHOCARDIOGRAM N/A 11/21/2018  ? Procedure: Intraoperative Transthoracic Echocardiogram;  Surgeon: Kathleene Hazel, MD;  Location: [facility] OR;  Service: Open Heart Surgery;  Laterality: N/A;  ? MENISCUS REPAIR Left   ? RIGHT/LEFT HEART CATH AND CORONARY ANGIOGRAPHY N/A 10/12/2018  ? Procedure: RIGHT/LEFT HEART CATH AND CORONARY ANGIOGRAPHY;  Surgeon: Kathleene Hazel,  MD;  Location: [facility] INVASIVE CV LAB;  Service: Cardiovascular;  Laterality: N/A;  ? TRANSCATHETER AORTIC VALVE REPLACEMENT, TRANSFEMORAL N/A 11/21/2018  ? Procedure: TRANSCATHETER AORTIC VALVE REPLACEMENT, TRANSFEMORAL;  Surgeon: Kathleene Hazel, MD;  Location: [facility] OR;  Service: Open Heart Surgery;  Laterality: N/A;  ? UMBILICAL HERNIA REPAIR    ? ? ?Current Medications: ?Current Meds  ?Medication Sig  ? anastrozole (ARIMIDEX) 1 MG tablet Take 0.25 mg by mouth 2 (two) times a week. Pt states he is taking 1/4 of a tablet 2 times a week  ? ARMOUR THYROID 30 MG tablet Take 30 mg by mouth daily before breakfast.   ? aspirin EC 81 MG tablet Take 1 tablet (81 mg total) by mouth daily. Swallow whole.  ? azithromycin (ZITHROMAX) 500 MG tablet Take one tablet by mouth 1 hour prior to dental procedures  ? cholecalciferol (VITAMIN D3) 25 MCG (1000 UT) tablet Take 1,000 Units by mouth daily.  ? ELIQUIS 5 MG TABS tablet Take 1 tablet (5 mg total) by mouth 2 (two) times daily.  ? Glucosamine-MSM-Hyaluronic Acd (JOINT HEALTH PO) Take 1 capsule by mouth daily.  ? Multiple Vitamins-Minerals (MULTIVITAMIN WITH MINERALS) tablet Take 1 tablet by mouth daily.   ? nitroGLYCERIN (NITROSTAT) 0.4 MG SL tablet Place 0.4 mg under the tongue as directed.  ? rosuvastatin (CRESTOR) 10 MG tablet Take 1 tablet (10 mg total) by mouth daily.  ? sildenafil (REVATIO) 20 MG t

## 2021-11-04 NOTE — Patient Instructions (Addendum)
Medication Instructions:  ?Your physician recommends that you continue on your current medications as directed. Please refer to the Current Medication list given to you today. ? ?*If you need a refill on your cardiac medications before your next appointment, please call your pharmacy* ? ? ?Lab Work: ?LDH, Haptoglobin, Hematocrit- today  ? ?If you have labs (blood work) drawn today and your tests are completely normal, you will receive your results only by: ?MyChart Message (if you have MyChart) OR ?A paper copy in the mail ?If you have any lab test that is abnormal or we need to change your treatment, we will call you to review the results. ? ? ?Testing/Procedures: ?Your physician has requested that you have an echocardiogram in April 2024. Echocardiography is a painless test that uses sound waves to create images of your heart. It provides your doctor with information about the size and shape of your heart and how well your heart?s chambers and valves are working. This procedure takes approximately one hour. There are no restrictions for this procedure. ? ? ? ?Follow-Up: ?Follow up as scheduled  ? ? ?Other Instructions ?None   ?

## 2021-11-05 LAB — HAPTOGLOBIN: Haptoglobin: 121 mg/dL (ref 34–355)

## 2021-11-05 LAB — LACTATE DEHYDROGENASE: LDH: 226 IU/L — ABNORMAL HIGH (ref 121–224)

## 2021-11-05 LAB — HEMATOCRIT: Hematocrit: 51.5 % — ABNORMAL HIGH (ref 37.5–51.0)

## 2021-11-14 NOTE — Progress Notes (Signed)
?Cardiology Office Note:   ? ?Date:  11/16/2021  ? ?ID:  Aaron Foley, DOB 1941/05/08, MRN [id number] ? ?PCP:  Philemon Kingdom, MD  ?Cardiologist:  Norman Herrlich, MD   ? ?Referring MD: Philemon Kingdom, MD  ? ? ?ASSESSMENT:   ? ?1. S/P TAVR (transcatheter aortic valve replacement)   ?2. Prosthetic valve dysfunction, initial encounter   ?3. Coronary artery disease involving native heart without angina pectoris, unspecified vessel or lesion type   ?4. Essential hypertension   ?5. Hypercholesterolemia   ?6. Polycythemia   ? ?PLAN:   ? ?In order of problems listed above: ? ?Clinically continues to do well New York Heart Association class I he continues on anticoagulant and aspirin with evidence of prosthetic valve dysfunction and now has the presence of aortic regurgitation although he is asymptomatic.  He has arrangements for follow-up echocardiogram in [place]. ?Stable CAD no anginal discomfort continue medical treatment ?Blood pressure at target on current regimen including ARB ?Lipids are ideal continue high intensity statin ?Improved likely related to testosterone he did require phlebotomy in [place].  Dosage was decreased. ? ? ?Next appointment: 1 year with me to be seen in [place] in between ? ? ?Medication Adjustments/Labs and Tests Ordered: ?Current medicines are reviewed at length with the patient today.  Concerns regarding medicines are outlined above.  ?Orders Placed This Encounter  ?Procedures  ? EKG 12-Lead  ? ?No orders of the defined types were placed in this encounter. ? ? ?Chief Complaint  ?Patient presents with  ? Follow-up  ?After TAVR ? ?History of Present Illness:   ? ?Aaron Foley is a 81 y.o. male with a hx of  aortic stenosis TAVR 11/21/2018 hypertension hyperlipidemia and CAD with non-ST elevation MI 08/02/2020 and findings of prosthetic valve dysfunction with restricted leaflet excursion.He was initiated on anticoagulant therapy Eliquis 08/06/2020. ?Left heart  catheterization was performed 08/05/2020 and it showed 40 to 50% diffuse left main stenosis with minimal luminal area 6.2 mm? 60% ostial LAD stenosis 60% ostial right coronary artery stenosis.  He was admitted to [facility] 08/03/2020 in [place] for non-ST elevation MI he had chest pain troponin was elevated.  He did not have PCI he was found to have leaflet thrombosis TAVR and was anticoagulated . He was  last seen 11/04/2020. ? ?Compliance with diet, lifestyle and medications: Yes ?It is always good to see Aaron Foley in the office along with his wife ?He is enjoying life and living a good part of the year in [place] which has been good for his overall health ?He has had no hospitalizations and also receives cardiology care at his home in [place] ?No episodes of angina edema shortness of breath palpitation or syncope. ?Recent labs 07/23/2021 showed lipids at target cholesterol 125 LDL 67 hemoglobin 16.4 sodium 4.4 creatinine 1.04 ?He has had more recent labs performed 11/04/2021 hematocrit 51.5 normal renal function creatinine 1.11 normal TSH and T4 ?Cholesterol 115 LDL 62 triglycerides 98 HDL 30 ? ?Echocardiogram shows no finding of prosthetic valve obstruction but he does have a moderate paravalvular leak and has arrangements for follow-up imaging in [place] in 6 months ? ?Echo 11/04/2021: ? 1. There is a 26 mm Sapien 3 prosthetic (TAVR) valve present in the  ?aortic position. Procedure Date: 11/21/2018. Normal function and gradients ? 2. Left ventricular ejection fraction, by estimation, is 55 to 60%. The  ?left ventricle has normal function. The left ventricle has no regional  ?wall motion abnormalities. There is mild left ventricular hypertrophy.  ?  Left ventricular diastolic parameters are indeterminate.  ? 3. Right ventricular systolic function is normal. The right ventricular  ?size is normal.  ? 4. The mitral valve is degenerative. Trivial mitral valve regurgitation.  ?No evidence of mitral stenosis.   ? 5. The inferior vena cava is normal in size with greater than 50%  ?respiratory variability, suggesting right atrial pressure of 3 mmHg.  ? 6. Left atrial size was mildly dilated.  ? 7. Aortic dilatation noted. There is mild dilatation of the ascending  ?aorta, measuring 39 mm.  ? 8. Right atrial size was mildly dilated.  ?Past Medical History:  ?Diagnosis Date  ? Aortic stenosis   ? BPH (benign prostatic hyperplasia) 08/03/2020  ? Last Assessment & Plan:  Formatting of this note might be different from the original. Chronic S/p urological intervention  On no medications chronically  Plan: -Continue to monitor -Consider flomax if evidence of urinary retention  ? Class 1 obesity in adult 08/03/2020  ? Last Assessment & Plan:  Formatting of this note might be different from the original. BMI 32.42 Complicates all aspects of care  Plan: -Continue to encourage diet and lifestyle modifications resulting in long term weight loss  ? Closed left ankle fracture 2014  ? Coronary artery disease involving native coronary artery 08/04/2020  ? Formatting of this note is different from the original. Left heart cath with Dr. Micheal Likens, 08/05/20: LEFT MAIN coronary artery: Large-caliber artery which divides into LAD and circumflex.  It has diffuse up to 40-50% stenosis. LEFT ANTERIOR DESCENDING coronary artery: Large caliber vessel. It gives rise to multiple septal perforators and diagonal arteries.  There is dual LAD system.  After the origin   ? Disc disease, degenerative, cervical   ? Diverticulosis   ? mild left colonic  ? Essential hypertension 08/10/2017  ? GERD (gastroesophageal reflux disease) 08/03/2020  ? Last Assessment & Plan:  Formatting of this note might be different from the original. Pt reports chronic States he takes a digestive enzyme capsule TID with meals since his GB removal, states it is not a powder Also takes antacids regularly  Plan: -Daily PPI -ZenPep TID -Tums and Maalox available  ? History of aortic stenosis  08/03/2020  ? Last Assessment & Plan:  Formatting of this note might be different from the original. Post TAVR -  TEE tomorrow  For  reeval  Of  Val - ?  Valve  Dysfunction  Source  Of Sx's - DR Denny Peon evaluating today  ? Hormone replacement therapy 08/03/2020  ? Last Assessment & Plan:  Formatting of this note might be different from the original. Patient reports he uses testosterone cream topically BID Duration of use 7-8 years Initial symptoms: fatigue, low energy  Plan: -Hold HRT at present  ? Hypercholesterolemia 08/10/2017  ? Hypertension 08/10/2017  ? Hypothyroidism 08/10/2017  ? Low testosterone in male 08/10/2017  ? Low vitamin B12 level 08/10/2017  ? Mild aortic stenosis 08/10/2017  ? NSTEMI (non-ST elevated myocardial infarction) (HCC) 08/01/2020  ? Last Assessment & Plan:  Formatting of this note is different from the original. Pt initially presented to [facility] in [place] complaining of tightness in chest with walking EKG non-ischemic Trop: 0.189 ---> 1195 --->1039 S/p plavix load 600mg  po , lovenox 1mg /kg, metoprolol, asa  Then taken for heart cath 08/02/20, which revealed 3 vessel disease CTS consulted and recommended transfer to [place] for CAB  ? Penis disorder 08/10/2017  ? Perione's disease/penile fibrosis   ? Polycythemia 08/03/2020  ?  Last Assessment & Plan:  Formatting of this note might be different from the original. Chronic - Likely secondary  To  Chronic  Hypoxemia /   Fe  Level - nl so  Unlikely hemachromatosis  -  Home  noc  Pulse oxy  needed  Plan: -Continue to monitor -Consider therapeutic phlebotomy for Hct >60  ? S/P TAVR (transcatheter aortic valve replacement)   ? 26 mm Edwards Sapien 3 transcatheter heart valve placed via percutaneous right transfemoral approach   ? Severe aortic stenosis   ? Skin cancer   ? on face/forehead basil cell  ? Vitamin D deficiency 08/10/2017  ? Wears glasses   ? readers  ? ? ?Past Surgical History:  ?Procedure Laterality Date  ? COLONOSCOPY    ? INTRAOPERATIVE TRANSTHORACIC  ECHOCARDIOGRAM N/A 11/21/2018  ? Procedure: Intraoperative Transthoracic Echocardiogram;  Surgeon: Kathleene Hazel, MD;  Location: [facility] OR;  Service: Open Heart Surgery;  Laterality: N/A;  ? MENISCUS REPAIR Left   ? RIGHT/L

## 2021-11-16 ENCOUNTER — Ambulatory Visit: Payer: Medicare HMO | Admitting: Cardiology

## 2021-11-16 ENCOUNTER — Encounter: Payer: Self-pay | Admitting: Cardiology

## 2021-11-16 VITALS — BP 154/74 | HR 66 | Ht 70.0 in | Wt 230.0 lb

## 2021-11-16 DIAGNOSIS — I10 Essential (primary) hypertension: Secondary | ICD-10-CM

## 2021-11-16 DIAGNOSIS — Z952 Presence of prosthetic heart valve: Secondary | ICD-10-CM | POA: Diagnosis not present

## 2021-11-16 DIAGNOSIS — T8209XD Other mechanical complication of heart valve prosthesis, subsequent encounter: Secondary | ICD-10-CM

## 2021-11-16 DIAGNOSIS — I2510 Atherosclerotic heart disease of native coronary artery without angina pectoris: Secondary | ICD-10-CM | POA: Diagnosis not present

## 2021-11-16 DIAGNOSIS — D751 Secondary polycythemia: Secondary | ICD-10-CM | POA: Diagnosis not present

## 2021-11-16 DIAGNOSIS — T8209XA Other mechanical complication of heart valve prosthesis, initial encounter: Secondary | ICD-10-CM

## 2021-11-16 DIAGNOSIS — E7800 Pure hypercholesterolemia, unspecified: Secondary | ICD-10-CM | POA: Diagnosis not present

## 2021-11-16 NOTE — Patient Instructions (Signed)
Medication Instructions:  ?Your physician recommends that you continue on your current medications as directed. Please refer to the Current Medication list given to you today. ? ?*If you need a refill on your cardiac medications before your next appointment, please call your pharmacy* ? ? ?Lab Work: ?NONE ?If you have labs (blood work) drawn today and your tests are completely normal, you will receive your results only by: ?MyChart Message (if you have MyChart) OR ?A paper copy in the mail ?If you have any lab test that is abnormal or we need to change your treatment, we will call you to review the results. ? ? ?Testing/Procedures: ?NONE ? ? ?Follow-Up: ?At [facility], you and your health needs are our priority.  As part of our continuing mission to provide you with exceptional heart care, we have created designated Provider Care Teams.  These Care Teams include your primary Cardiologist (physician) and Advanced Practice Providers (APPs -  Physician Assistants and Nurse Practitioners) who all work together to provide you with the care you need, when you need it. ? ?We recommend signing up for the patient portal called "MyChart".  Sign up information is provided on this After Visit Summary.  MyChart is used to connect with patients for Virtual Visits (Telemedicine).  Patients are able to view lab/test results, encounter notes, upcoming appointments, etc.  Non-urgent messages can be sent to your provider as well.   ?To learn more about what you can do with MyChart, go to [email or web address].   ? ?Your next appointment:   ?1 year(s) ? ?The format for your next appointment:   ?In Person ? ?Provider:   ?Shirlee More, MD  ? ? ?Other Instructions ? ? ?Important Information About Sugar ? ? ? ? ?  ?

## 2021-12-23 DIAGNOSIS — Z952 Presence of prosthetic heart valve: Secondary | ICD-10-CM | POA: Diagnosis not present

## 2021-12-23 DIAGNOSIS — I7090 Unspecified atherosclerosis: Secondary | ICD-10-CM | POA: Diagnosis not present

## 2021-12-23 DIAGNOSIS — R7303 Prediabetes: Secondary | ICD-10-CM | POA: Diagnosis not present

## 2021-12-23 DIAGNOSIS — D751 Secondary polycythemia: Secondary | ICD-10-CM | POA: Diagnosis not present

## 2021-12-23 DIAGNOSIS — E039 Hypothyroidism, unspecified: Secondary | ICD-10-CM | POA: Diagnosis not present

## 2021-12-23 DIAGNOSIS — E291 Testicular hypofunction: Secondary | ICD-10-CM | POA: Diagnosis not present

## 2021-12-23 DIAGNOSIS — R55 Syncope and collapse: Secondary | ICD-10-CM | POA: Diagnosis not present

## 2021-12-24 DIAGNOSIS — D1801 Hemangioma of skin and subcutaneous tissue: Secondary | ICD-10-CM | POA: Diagnosis not present

## 2021-12-24 DIAGNOSIS — Z85828 Personal history of other malignant neoplasm of skin: Secondary | ICD-10-CM | POA: Diagnosis not present

## 2021-12-24 DIAGNOSIS — L570 Actinic keratosis: Secondary | ICD-10-CM | POA: Diagnosis not present

## 2022-01-12 ENCOUNTER — Other Ambulatory Visit: Payer: Self-pay | Admitting: Physician Assistant

## 2022-01-12 DIAGNOSIS — D751 Secondary polycythemia: Secondary | ICD-10-CM | POA: Diagnosis not present

## 2022-01-21 DIAGNOSIS — H5203 Hypermetropia, bilateral: Secondary | ICD-10-CM | POA: Diagnosis not present

## 2022-01-21 DIAGNOSIS — H524 Presbyopia: Secondary | ICD-10-CM | POA: Diagnosis not present

## 2022-01-21 DIAGNOSIS — Z135 Encounter for screening for eye and ear disorders: Secondary | ICD-10-CM | POA: Diagnosis not present

## 2022-01-21 DIAGNOSIS — H2513 Age-related nuclear cataract, bilateral: Secondary | ICD-10-CM | POA: Diagnosis not present

## 2022-01-21 DIAGNOSIS — H52221 Regular astigmatism, right eye: Secondary | ICD-10-CM | POA: Diagnosis not present

## 2022-02-11 DIAGNOSIS — D751 Secondary polycythemia: Secondary | ICD-10-CM | POA: Diagnosis not present

## 2022-03-15 DIAGNOSIS — D751 Secondary polycythemia: Secondary | ICD-10-CM | POA: Diagnosis not present

## 2022-03-24 DIAGNOSIS — E291 Testicular hypofunction: Secondary | ICD-10-CM | POA: Diagnosis not present

## 2022-03-24 DIAGNOSIS — R7303 Prediabetes: Secondary | ICD-10-CM | POA: Diagnosis not present

## 2022-03-24 DIAGNOSIS — D751 Secondary polycythemia: Secondary | ICD-10-CM | POA: Diagnosis not present

## 2022-03-30 ENCOUNTER — Other Ambulatory Visit: Payer: Self-pay | Admitting: Cardiology

## 2022-03-30 NOTE — Telephone Encounter (Signed)
Prescription refill request for Eliquis received. Indication:TAVR Last office visit:4/23 Scr:1.1 Age: 81 Weight:104.3 kg  Prescription refilled

## 2022-04-22 DIAGNOSIS — D751 Secondary polycythemia: Secondary | ICD-10-CM | POA: Diagnosis not present

## 2022-04-26 DIAGNOSIS — I10 Essential (primary) hypertension: Secondary | ICD-10-CM | POA: Diagnosis not present

## 2022-04-26 DIAGNOSIS — I2510 Atherosclerotic heart disease of native coronary artery without angina pectoris: Secondary | ICD-10-CM | POA: Diagnosis not present

## 2022-04-26 DIAGNOSIS — E785 Hyperlipidemia, unspecified: Secondary | ICD-10-CM | POA: Diagnosis not present

## 2022-04-26 DIAGNOSIS — Z953 Presence of xenogenic heart valve: Secondary | ICD-10-CM | POA: Diagnosis not present

## 2022-05-20 DIAGNOSIS — D751 Secondary polycythemia: Secondary | ICD-10-CM | POA: Diagnosis not present

## 2022-06-02 ENCOUNTER — Telehealth: Payer: Self-pay

## 2022-06-02 NOTE — Patient Outreach (Signed)
Care Coordination   06/02/2022 Name: Aaron Foley MRN: [id number] DOB: [birth year]   Care Coordination Outreach Attempts:  An unsuccessful telephone outreach was attempted today to offer the patient information about available care coordination services as a benefit of their health plan.   Follow Up Plan:  Additional outreach attempts will be made to offer the patient care coordination information and services.   Encounter Outcome:  No Answer  Care Coordination Interventions Activated:  No   Care Coordination Interventions:  No, not indicated   Rowe Pavy, RN, BSN, CEN [facility] [phone]

## 2022-06-21 DIAGNOSIS — D751 Secondary polycythemia: Secondary | ICD-10-CM | POA: Diagnosis not present

## 2022-06-30 DIAGNOSIS — R7303 Prediabetes: Secondary | ICD-10-CM | POA: Diagnosis not present

## 2022-06-30 DIAGNOSIS — E039 Hypothyroidism, unspecified: Secondary | ICD-10-CM | POA: Diagnosis not present

## 2022-06-30 DIAGNOSIS — Z952 Presence of prosthetic heart valve: Secondary | ICD-10-CM | POA: Diagnosis not present

## 2022-06-30 DIAGNOSIS — D751 Secondary polycythemia: Secondary | ICD-10-CM | POA: Diagnosis not present

## 2022-07-05 ENCOUNTER — Telehealth: Payer: Self-pay

## 2022-07-05 NOTE — Patient Outreach (Signed)
Care Coordination   07/05/2022 Name: Aaron Foley MRN: [id number] DOB: March 02, 1941   Care Coordination Outreach Attempts:  A second unsuccessful outreach was attempted today to offer the patient with information about available care coordination services as a benefit of their health plan.     Follow Up Plan:  Additional outreach attempts will be made to offer the patient care coordination information and services.   Encounter Outcome:  No Answer   Care Coordination Interventions:  No, not indicated    Rowe Pavy, RN, BSN, [facility] [phone]

## 2022-07-06 ENCOUNTER — Telehealth: Payer: Self-pay

## 2022-07-06 NOTE — Patient Outreach (Signed)
Care Coordination   07/06/2022 Name: COMMODORE MONTEMURRO MRN: [id number] DOB: Jan 24, 1941   Care Coordination Outreach Attempts:  A third unsuccessful outreach was attempted today to offer the patient with information about available care coordination services as a benefit of their health plan.   Follow Up Plan:  No further outreach attempts will be made at this time. We have been unable to contact the patient to offer or enroll patient in care coordination services  Encounter Outcome:  No Answer   Care Coordination Interventions:  No, not indicated    Rowe Pavy, RN, BSN, CEN [facility] Coordinator [phone]

## 2022-07-12 DIAGNOSIS — L570 Actinic keratosis: Secondary | ICD-10-CM | POA: Diagnosis not present

## 2022-07-12 DIAGNOSIS — D485 Neoplasm of uncertain behavior of skin: Secondary | ICD-10-CM | POA: Diagnosis not present

## 2022-07-12 DIAGNOSIS — Z85828 Personal history of other malignant neoplasm of skin: Secondary | ICD-10-CM | POA: Diagnosis not present

## 2022-07-12 DIAGNOSIS — L853 Xerosis cutis: Secondary | ICD-10-CM | POA: Diagnosis not present

## 2022-07-12 DIAGNOSIS — L989 Disorder of the skin and subcutaneous tissue, unspecified: Secondary | ICD-10-CM | POA: Diagnosis not present

## 2022-07-21 DIAGNOSIS — D751 Secondary polycythemia: Secondary | ICD-10-CM | POA: Diagnosis not present

## 2022-07-31 ENCOUNTER — Other Ambulatory Visit: Payer: Self-pay | Admitting: Cardiology

## 2022-08-03 NOTE — Telephone Encounter (Signed)
Rx refill sent to pharmacy. 

## 2022-08-23 DIAGNOSIS — D751 Secondary polycythemia: Secondary | ICD-10-CM | POA: Diagnosis not present

## 2022-08-27 DIAGNOSIS — R69 Illness, unspecified: Secondary | ICD-10-CM | POA: Diagnosis not present

## 2022-09-13 DIAGNOSIS — R69 Illness, unspecified: Secondary | ICD-10-CM | POA: Diagnosis not present

## 2022-09-16 DIAGNOSIS — H903 Sensorineural hearing loss, bilateral: Secondary | ICD-10-CM | POA: Diagnosis not present

## 2022-09-20 DIAGNOSIS — D751 Secondary polycythemia: Secondary | ICD-10-CM | POA: Diagnosis not present

## 2022-09-30 DIAGNOSIS — H6123 Impacted cerumen, bilateral: Secondary | ICD-10-CM | POA: Diagnosis not present

## 2022-09-30 DIAGNOSIS — H903 Sensorineural hearing loss, bilateral: Secondary | ICD-10-CM | POA: Diagnosis not present

## 2022-10-12 DIAGNOSIS — H903 Sensorineural hearing loss, bilateral: Secondary | ICD-10-CM | POA: Diagnosis not present

## 2022-10-18 DIAGNOSIS — D751 Secondary polycythemia: Secondary | ICD-10-CM | POA: Diagnosis not present

## 2022-11-01 ENCOUNTER — Other Ambulatory Visit: Payer: Self-pay | Admitting: Cardiology

## 2022-11-01 NOTE — Telephone Encounter (Signed)
Refill to pharmacy 

## 2022-11-10 DIAGNOSIS — Z6832 Body mass index (BMI) 32.0-32.9, adult: Secondary | ICD-10-CM | POA: Diagnosis not present

## 2022-11-10 DIAGNOSIS — I2510 Atherosclerotic heart disease of native coronary artery without angina pectoris: Secondary | ICD-10-CM | POA: Diagnosis not present

## 2022-11-10 DIAGNOSIS — Z0000 Encounter for general adult medical examination without abnormal findings: Secondary | ICD-10-CM | POA: Diagnosis not present

## 2022-11-10 DIAGNOSIS — D751 Secondary polycythemia: Secondary | ICD-10-CM | POA: Diagnosis not present

## 2022-11-10 DIAGNOSIS — E1169 Type 2 diabetes mellitus with other specified complication: Secondary | ICD-10-CM | POA: Diagnosis not present

## 2022-11-10 DIAGNOSIS — R0609 Other forms of dyspnea: Secondary | ICD-10-CM | POA: Diagnosis not present

## 2022-11-10 DIAGNOSIS — E785 Hyperlipidemia, unspecified: Secondary | ICD-10-CM | POA: Diagnosis not present

## 2022-11-10 DIAGNOSIS — E538 Deficiency of other specified B group vitamins: Secondary | ICD-10-CM | POA: Diagnosis not present

## 2022-11-10 DIAGNOSIS — E039 Hypothyroidism, unspecified: Secondary | ICD-10-CM | POA: Diagnosis not present

## 2022-11-10 DIAGNOSIS — I700 Atherosclerosis of aorta: Secondary | ICD-10-CM | POA: Diagnosis not present

## 2022-11-10 DIAGNOSIS — Z79899 Other long term (current) drug therapy: Secondary | ICD-10-CM | POA: Diagnosis not present

## 2022-11-10 DIAGNOSIS — N529 Male erectile dysfunction, unspecified: Secondary | ICD-10-CM | POA: Diagnosis not present

## 2022-11-10 DIAGNOSIS — R7301 Impaired fasting glucose: Secondary | ICD-10-CM | POA: Diagnosis not present

## 2022-11-10 DIAGNOSIS — I10 Essential (primary) hypertension: Secondary | ICD-10-CM | POA: Diagnosis not present

## 2022-11-10 DIAGNOSIS — K5790 Diverticulosis of intestine, part unspecified, without perforation or abscess without bleeding: Secondary | ICD-10-CM | POA: Diagnosis not present

## 2022-11-10 DIAGNOSIS — Z125 Encounter for screening for malignant neoplasm of prostate: Secondary | ICD-10-CM | POA: Diagnosis not present

## 2022-11-10 DIAGNOSIS — D582 Other hemoglobinopathies: Secondary | ICD-10-CM | POA: Diagnosis not present

## 2022-11-10 DIAGNOSIS — I350 Nonrheumatic aortic (valve) stenosis: Secondary | ICD-10-CM | POA: Diagnosis not present

## 2022-11-10 DIAGNOSIS — E559 Vitamin D deficiency, unspecified: Secondary | ICD-10-CM | POA: Diagnosis not present

## 2022-11-19 ENCOUNTER — Ambulatory Visit (HOSPITAL_COMMUNITY): Payer: Medicare HMO | Attending: Cardiology

## 2022-11-19 ENCOUNTER — Other Ambulatory Visit (HOSPITAL_COMMUNITY): Payer: Medicare HMO

## 2022-11-19 ENCOUNTER — Other Ambulatory Visit: Payer: Self-pay | Admitting: Cardiology

## 2022-11-19 ENCOUNTER — Ambulatory Visit: Payer: Medicare HMO | Attending: Cardiology | Admitting: Cardiology

## 2022-11-19 VITALS — BP 162/84 | HR 60 | Ht 71.0 in | Wt 227.0 lb

## 2022-11-19 DIAGNOSIS — T8209XS Other mechanical complication of heart valve prosthesis, sequela: Secondary | ICD-10-CM

## 2022-11-19 DIAGNOSIS — Z952 Presence of prosthetic heart valve: Secondary | ICD-10-CM

## 2022-11-19 DIAGNOSIS — I214 Non-ST elevation (NSTEMI) myocardial infarction: Secondary | ICD-10-CM | POA: Diagnosis not present

## 2022-11-19 DIAGNOSIS — Z953 Presence of xenogenic heart valve: Secondary | ICD-10-CM | POA: Diagnosis not present

## 2022-11-19 DIAGNOSIS — I2510 Atherosclerotic heart disease of native coronary artery without angina pectoris: Secondary | ICD-10-CM

## 2022-11-19 DIAGNOSIS — I10 Essential (primary) hypertension: Secondary | ICD-10-CM

## 2022-11-19 DIAGNOSIS — T8209XA Other mechanical complication of heart valve prosthesis, initial encounter: Secondary | ICD-10-CM

## 2022-11-19 LAB — ECHOCARDIOGRAM COMPLETE
AR max vel: 1.39 cm2
AV Area VTI: 1.37 cm2
AV Area mean vel: 1.27 cm2
AV Mean grad: 20 mmHg
AV Peak grad: 37 mmHg
Ao pk vel: 3.04 m/s
Area-P 1/2: 2.03 cm2
P 1/2 time: 458 msec
S' Lateral: 3 cm

## 2022-11-19 NOTE — Patient Instructions (Signed)
Medication Instructions:  Your physician recommends that you continue on your current medications as directed. Please refer to the Current Medication list given to you today.  *If you need a refill on your cardiac medications before your next appointment, please call your pharmacy*   Lab Work: NONE If you have labs (blood work) drawn today and your tests are completely normal, you will receive your results only by: MyChart Message (if you have MyChart) OR A paper copy in the mail If you have any lab test that is abnormal or we need to change your treatment, we will call you to review the results.   Testing/Procedures: NONE   Follow-Up: At [facility], you and your health needs are our priority.  As part of our continuing mission to provide you with exceptional heart care, we have created designated Provider Care Teams.  These Care Teams include your primary Cardiologist (physician) and Advanced Practice Providers (APPs -  Physician Assistants and Nurse Practitioners) who all work together to provide you with the care you need, when you need it.  We recommend signing up for the patient portal called "MyChart".  Sign up information is provided on this After Visit Summary.  MyChart is used to connect with patients for Virtual Visits (Telemedicine).  Patients are able to view lab/test results, encounter notes, upcoming appointments, etc.  Non-urgent messages can be sent to your provider as well.   To learn more about what you can do with MyChart, go to [email or web address].    Your next appointment:   KEEP SCHEDULED FOLLOW-UP 

## 2022-11-19 NOTE — Progress Notes (Signed)
[facility]   MULTIDISCIPLINARY HEART VALVE CLINIC                                     Cardiology Office Note:    Date:  11/19/2022   ID:  Aaron Foley, DOB 23-Feb-1941, MRN [id number]  PCP:  Aaron Kingdom, MD  [facility] Cardiologist:  Aaron Herrlich, MD/ Dr. Clifton Foley & Dr. Cornelius Foley (TAVR)  [facility] Electrophysiologist:  None   Referring MD: Aaron Kingdom, MD   Chief Complaint  Patient presents with   Follow-up    Follow up AV leaflet thrombosis    History of Present Illness:    Aaron Foley is a 82 y.o. male with a hx of HTN, HLD, hypothyroidism, and severe AS s/p TAVR (11/21/18) with subsequent leaflet thrombosis now on Eliquis who presents for follow up of AV leaflet thrombosis.    He was diagnosed with severe AS in 2020. Pre op catheterization revealed mild nonobstructive coronary artery disease and confirmed the presence of severe aortic stenosis. He underwent successful TAVR with a 26 mm Edwards Sapien 3 THV via the TF approach on 11/21/18. Post operative echo showed EF 65%, poorly visualized TAVR with elevated mean gradient of 24 mm Hg and no PVL. He was discharged on POD1 with aspirin and plavix. 1 month echo showed echo 11/21/19 showed EF 65%, normally functioning TAVR with a mean gradient of 19 mmHg (improved from 24 mm Hg) and trivial PVL.    He did quite well in follow up and was able to travel back and forth to [place] with his wife. He was admitted to [facility] 08/03/2020 in [place] for NSTEMI. Echo at that time showed EF 60%, abnormal aortic valve velocity peak 50 mmHg and mean 30 mm hg.  He underwent a TEE which showed an abnormal  aortic valve with restricted leaflet excursion and severe bioprosthetic aortic valve stenosis. Left heart catheterization was performed 08/05/2020 and it showed 40 to 50% diffuse left main stenosis with minimal luminal area 6.2 mm, 60% ostial LAD stenosis, 60% ostial right coronary artery stenosis.  There was no indication for percutaneous intervention and was started on Eliquis for acute leaflet thrombosis.    He established care with Aaron Foley in [place], [place]. Had an echo in 05/2021 that showed EF 56%, normally functioning TAVR with a mean gradient of 20 mm hg and mild AI. Since that time he underwent an echo in 10/2020 which showed normal EF and a mean gradient around . Unfortunately when he was last seen for one year s/p TAVR follow up, his echo showed moderate PVL seen at 5:00 on BSA views, with a mean gradient of 8 mm hg. There was a significant increase in degree of PVL since echo done 11/05/20 and interestingly very low gradients when compared to prior imaging. This was felt secondary to thrombosis induced leaflet deterioration leading to moderate PVL. Haptoglobin at 121, LDH 26 HCT 51.5 (HCT elevated but obscured by TRT induced polycythemia). Plan was to continue Eliquis and ASA with close follow up. He was seen by his cardiologist in [place] 04/2022 with no change in symptoms and no imaging performed.   Today he is here with his wife and reports that he continues to do very well with no CP or SOB. No LE edema, orthopnea or PND. No dizziness or syncope. No blood in stool or urine. No palpitations. He continues  to spend majority of his time in [place]. Stays active with hiking and being outdoors.   Past Medical History:  Diagnosis Date   Aortic stenosis    BPH (benign prostatic hyperplasia) 08/03/2020   Last Assessment & Plan:  Formatting of this note might be different from the original. Chronic S/p urological intervention  On no medications chronically  Plan: -Continue to monitor -Consider flomax if evidence of urinary retention   Class 1 obesity in adult 08/03/2020   Last Assessment & Plan:  Formatting of this note might be different from the original. BMI 32.42 Complicates all aspects of care  Plan: -Continue to encourage diet and lifestyle modifications resulting in long term weight loss   Closed  left ankle fracture 2014   Coronary artery disease involving native coronary artery 08/04/2020   Formatting of this note is different from the original. Left heart cath with Dr. Micheal Foley, 08/05/20: LEFT MAIN coronary artery: Large-caliber artery which divides into LAD and circumflex.  It has diffuse up to 40-50% stenosis. LEFT ANTERIOR DESCENDING coronary artery: Large caliber vessel. It gives rise to multiple septal perforators and diagonal arteries.  There is dual LAD system.  After the origin    Disc disease, degenerative, cervical    Diverticulosis    mild left colonic   Essential hypertension 08/10/2017   GERD (gastroesophageal reflux disease) 08/03/2020   Last Assessment & Plan:  Formatting of this note might be different from the original. Pt reports chronic States he takes a digestive enzyme capsule TID with meals since his GB removal, states it is not a powder Also takes antacids regularly  Plan: -Daily PPI -ZenPep TID -Tums and Maalox available   History of aortic stenosis 08/03/2020   Last Assessment & Plan:  Formatting of this note might be different from the original. Post TAVR -  TEE tomorrow  For  reeval  Of  Val - ?  Valve  Dysfunction  Source  Of Sx's - DR Denny Peon evaluating today   Hormone replacement therapy 08/03/2020   Last Assessment & Plan:  Formatting of this note might be different from the original. Patient reports he uses testosterone cream topically BID Duration of use 7-8 years Initial symptoms: fatigue, low energy  Plan: -Hold HRT at present   Hypercholesterolemia 08/10/2017   Hypertension 08/10/2017   Hypothyroidism 08/10/2017   Low testosterone in male 08/10/2017   Low vitamin B12 level 08/10/2017   Mild aortic stenosis 08/10/2017   NSTEMI (non-ST elevated myocardial infarction) (HCC) 08/01/2020   Last Assessment & Plan:  Formatting of this note is different from the original. Pt initially presented to [facility] in [place] complaining of tightness in chest with walking EKG non-ischemic Trop: 0.189  ---> 1195 --->1039 S/p plavix load 600mg  po , lovenox 1mg /kg, metoprolol, asa  Then taken for heart cath 08/02/20, which revealed 3 vessel disease CTS consulted and recommended transfer to [place] for CAB   Penis disorder 08/10/2017   Perione's disease/penile fibrosis    Polycythemia 08/03/2020   Last Assessment & Plan:  Formatting of this note might be different from the original. Chronic - Likely secondary  To  Chronic  Hypoxemia /   Fe  Level - nl so  Unlikely hemachromatosis  -  Home  noc  Pulse oxy  needed  Plan: -Continue to monitor -Consider therapeutic phlebotomy for Hct >60   S/P TAVR (transcatheter aortic valve replacement)    26 mm Edwards Sapien 3 transcatheter heart valve placed via percutaneous right transfemoral approach  Severe aortic stenosis    Skin cancer    on face/forehead basil cell   Vitamin D deficiency 08/10/2017   Wears glasses    readers    Past Surgical History:  Procedure Laterality Date   COLONOSCOPY     INTRAOPERATIVE TRANSTHORACIC ECHOCARDIOGRAM N/A 11/21/2018   Procedure: Intraoperative Transthoracic Echocardiogram;  Surgeon: Kathleene Hazel, MD;  Location: [facility] OR;  Service: Open Heart Surgery;  Laterality: N/A;   MENISCUS REPAIR Left    RIGHT/LEFT HEART CATH AND CORONARY ANGIOGRAPHY N/A 10/12/2018   Procedure: RIGHT/LEFT HEART CATH AND CORONARY ANGIOGRAPHY;  Surgeon: Kathleene Hazel, MD;  Location: [facility] INVASIVE CV LAB;  Service: Cardiovascular;  Laterality: N/A;   TRANSCATHETER AORTIC VALVE REPLACEMENT, TRANSFEMORAL N/A 11/21/2018   Procedure: TRANSCATHETER AORTIC VALVE REPLACEMENT, TRANSFEMORAL;  Surgeon: Kathleene Hazel, MD;  Location: [facility] OR;  Service: Open Heart Surgery;  Laterality: N/A;   UMBILICAL HERNIA REPAIR      Current Medications: Current Meds  Medication Sig   anastrozole (ARIMIDEX) 1 MG tablet Take 0.25 mg by mouth 2 (two) times a week. Pt states he is taking 1/4 of a tablet 2 times a week   ARMOUR THYROID 30 MG tablet Take  30 mg by mouth daily before breakfast.    aspirin EC 81 MG tablet Take 1 tablet (81 mg total) by mouth daily. Swallow whole.   azithromycin (ZITHROMAX) 500 MG tablet Take one tablet by mouth 1 hour prior to dental procedures   cholecalciferol (VITAMIN D3) 25 MCG (1000 UT) tablet Take 1,000 Units by mouth daily.   ELIQUIS 5 MG TABS tablet Take 1 tablet (5 mg total) by mouth 2 (two) times daily.   Glucosamine-MSM-Hyaluronic Acd (JOINT HEALTH PO) Take 1 capsule by mouth daily.   Multiple Vitamins-Minerals (MULTIVITAMIN WITH MINERALS) tablet Take 1 tablet by mouth daily.   nitroGLYCERIN (NITROSTAT) 0.4 MG SL tablet Place 0.4 mg under the tongue as directed.   rosuvastatin (CRESTOR) 10 MG tablet Take 1 tablet (10 mg total) by mouth daily.   sildenafil (REVATIO) 20 MG tablet Take 20 mg by mouth daily as needed (ED).    telmisartan (MICARDIS) 40 MG tablet Take 60 mg by mouth at bedtime.    Testosterone 20 % CREA Apply 1 application topically daily.    vitamin C (ASCORBIC ACID) 500 MG tablet Take 1,000 mg by mouth daily.      Allergies:   Penicillins, Levofloxacin, and Lipitor [atorvastatin]   Social History   Socioeconomic History   Marital status: Married    Spouse name: Not on file   Number of children: Not on file   Years of education: Not on file   Highest education level: Not on file  Occupational History   Not on file  Tobacco Use   Smoking status: Former    Packs/day: 1.00    Years: 10.00    Additional pack years: 0.00    Total pack years: 10.00    Types: Cigarettes    Quit date: 29    Years since quitting: 55.3    Passive exposure: Past   Smokeless tobacco: Never  Vaping Use   Vaping Use: Never used  Substance and Sexual Activity   Alcohol use: Not Currently   Drug use: No   Sexual activity: Not on file  Other Topics Concern   Not on file  Social History Narrative   Not on file   Social Determinants of Health   Financial Resource Strain: Not on file  Food  Insecurity: Not on file  Transportation Needs: Not on file  Physical Activity: Not on file  Stress: Not on file  Social Connections: Not on file     Family History: The patient's family history includes CAD in his paternal grandfather; Diabetes in his maternal grandmother; Heart disease in his father; Hypertension in his maternal grandmother; Leukemia in his mother.  ROS:   Please see the history of present illness.    All other systems reviewed and are negative.  EKGs/Labs/Other Studies Reviewed:    The following studies were reviewed today:  TAVR OPERATIVE NOTE     Date of Procedure:                11/21/2018   Preoperative Diagnosis:      Severe Aortic Stenosis    Postoperative Diagnosis:    Same    Procedure:        Transcatheter Aortic Valve Replacement - Percutaneous Right Transfemoral Approach             Edwards Sapien 3 THV (size 26 mm, model # 9600TFX, serial # [id number])   Co-Surgeons:                        Salvatore Decent. Aaron Moras, MD and Verne Carrow, MD   Anesthesiologist:                  Dorris Singh, MD   Echocardiographer:              Tobias Alexander, MD   Pre-operative Echo Findings: Severe aortic stenosis Normal left ventricular systolic function   Post-operative Echo Findings: No paravalvular leak Normal left ventricular systolic function   _____________   Echo 11/22/2018 IMPRESSIONS  1. The left ventricle has hyperdynamic systolic function, with an ejection fraction of >65%. The cavity size was normal. There is moderate concentric left ventricular hypertrophy. Left ventricular diastolic function could not be evaluated due to  indeterminate diastolic function. Elevated left ventricular end-diastolic pressure.  2. The right ventricle has normal systolic function. The cavity was normal. There is no increase in right ventricular wall thickness.  3. There is moderate mitral annular calcification present.  4. S/P Edwards Sapien 3 THV (size 26mm).  TAVR but poorly visualized. Gradient is consistent with moderate aortic stenosis. AV Mean Grad: 24.55mmHg. AV Area (VTI): 1.07 cm. LVOT/AV VTI ratio: 0.42. There is no perivavular AI.   _____________   Echo 11/21/19 IMPRESSIONS   1. Left ventricular ejection fraction, by estimation, is 65 to 70%. The  left ventricle has normal function. The left ventricle has no regional  wall motion abnormalities. Left ventricular diastolic parameters are  indeterminate.   2. Right ventricular systolic function is normal. The right ventricular  size is normal.   3. Left atrial size was mildly dilated.   4. The mitral valve is normal in structure. Trivial mitral valve  regurgitation. No evidence of mitral stenosis.   5. TAVR well seated, trivial perivalvular leak. Prior mean gradient 23  mmHg, today 19 mmHg. The aortic valve has been repaired/replaced. Aortic  valve regurgitation is trivial. There is a 26 mm Edwards Edwards Sapien  prosthetic (TAVR) valve present in the aortic position. Procedure Date: 11/22/18. Echo findings are consistent with perivalvular leak of the aortic prosthesis.   6. The inferior vena cava is normal in size with greater than 50%  respiratory variability, suggesting right atrial pressure of 3 mmHg.   Comparison(s): No significant change from prior study.  _______________________   TEE 08/06/20 Echocardiogram TEE Complete   Anatomical Region Laterality Modality  -- -- Other  -- -- Echocardiography      Impression Performed by CPACS 1. Normal left ventricular systolic function, est LVEF 55-60%.  2. No evidence of thrombus within LA or LA appendage with normal LAA emptying velocity.  3. Severe bioprosthetic aortic valve stenosis with restricted leaflet excursion.  4. No other significant valvular abnormalities.  5. Mild atherosclerosis of the descending aorta and aortic arch .  6. Agitated saline bubble study negative for right-to-left interatrial shunt, with and without  Valsalva.   Spectral Doppler and Color Flow Velocity Mapping were used to interrogate the valves and cardiac structures.    ___________________   [facility] 08/05/20 Impression Performed by CPACS 1.  40-50% diffuse left main stenosis with minimal luminal area of 6.6  mm, 60% ostial LAD stenosis with minimal luminal area of 4.7 cm and 60%  ostial RCA stenosis with minimal luminal area of 6.8 mm. Initially,  ventricularization of coronary arterial waveform was noted after deep  engagement of 27F EBU 3.5 guide into left main-ostial LAD which resolved  after appropriate guide catheter manipulation.   RECOMMENDATIONS:  1. Routine post coronary angiography care. Start deflating TR band after 2  hour. 0.9% NS at 125 mL/kg/hr for 4 hrs.  2. Aggressive CVD risk factor modification.  3. No weightlifting (>10 lb) for 10 days.    _____________________   Echo 11/05/20 IMPRESSIONS   1. Left ventricular ejection fraction, by estimation, is 65 to 70%. The  left ventricle has normal function. The left ventricle has no regional  wall motion abnormalities. Left ventricular diastolic parameters are  consistent with Grade I diastolic  dysfunction (impaired relaxation). Elevated left ventricular end-diastolic  pressure.   2. Right ventricular systolic function is normal. The right ventricular  size is moderately enlarged. Tricuspid regurgitation signal is inadequate  for assessing PA pressure.   3. The mitral valve is normal in structure. No evidence of mitral valve  regurgitation. No evidence of mitral stenosis.   4. The aortic valve has been repaired/replaced. There is a 26 mm Sapien  prosthetic, stented (TAVR) valve present in the aortic position. Aortic  valve regurgitation is trivial. No aortic stenosis is present. Aortic  valve mean gradient measures 20.0 mmHg.   Aortic valve Vmax measures 3.03 m/s. DI is 0.42.   5. Aortic dilatation noted. There is mild dilatation of the ascending  aorta,  measuring 38 mm.   6. The inferior vena cava is normal in size with greater than 50%  respiratory variability, suggesting right atrial pressure of 3 mmHg.   7. Compared to prior echo, the mean AVG is stable at ( on  prior echo), DI slightly decreased from 0.48 to 0.42 today. The ascending  aorta has increased from 3.6cm to 3.8cm today.    _______________________   Echo 11/04/2021 IMPRESSIONS   1. There is a 26 mm Sapien 3 prosthetic (TAVR) valve present in the  aortic position. Procedure Date: 11/21/2018.   2. Left ventricular ejection fraction, by estimation, is 55 to 60%. The  left ventricle has normal function. The left ventricle has no regional  wall motion abnormalities. There is mild left ventricular hypertrophy.  Left ventricular diastolic parameters  are indeterminate.   3. Right ventricular systolic function is normal. The right ventricular  size is normal.   4. The mitral valve is degenerative. Trivial mitral valve regurgitation.  No evidence of mitral stenosis.  5. The inferior vena cava is normal in size with greater than 50%  respiratory variability, suggesting right atrial pressure of 3 mmHg.   6. Left atrial size was mildly dilated.   7. Aortic dilatation noted. There is mild dilatation of the ascending  aorta, measuring 39 mm.   8. Right atrial size was mildly dilated.    __________________    Echocardiogram 11/19/22:   1. Left ventricular ejection fraction, by estimation, is 60 to 65%. The  left ventricle has normal function. The left ventricle has no regional  wall motion abnormalities. There is mild left ventricular hypertrophy.  Left ventricular diastolic parameters  are consistent with Grade I diastolic dysfunction (impaired relaxation).   2. Right ventricular systolic function is normal. The right ventricular  size is normal.   3. The mitral valve is normal in structure. Trivial mitral valve  regurgitation. No evidence of mitral stenosis.   4.  Mild perivalvular leak. The aortic valve has been repaired/replaced.  Aortic valve regurgitation is not visualized. No aortic stenosis is  present. There is a 26 mm Sapien prosthetic (TAVR) valve present in the  aortic position. Procedure Date:  11/21/2018. Echo findings are consistent with perivalvular leak of the  aortic prosthesis. Aortic valve area, by VTI measures 1.37 cm. Aortic  valve mean gradient measures 20.0 mmHg. Aortic valve Vmax measures 3.04  m/s.   5. The inferior vena cava is normal in size with greater than 50%  respiratory variability, suggesting right atrial pressure of 3 mmHg.   Comparison(s): Prior images reviewed side by side. Prior 11/04/21 TAVR mean  gradient 8 mmHg.    EKG:  EKG is not ordered today.    Recent Labs: No results found for requested labs within last 365 days.  Recent Lipid Panel    Component Value Date/Time   CHOL 118 10/13/2020 0937   TRIG 158 (H) 10/13/2020 0937   HDL 32 (L) 10/13/2020 0937   CHOLHDL 3.7 10/13/2020 0937   LDLCALC 59 10/13/2020 0937   Physical Exam:    VS:  BP (!) 162/84   Pulse 60   Ht 5\' 11"  (1.803 m)   Wt 227 lb (103 kg)   SpO2 98%   BMI 31.66 kg/m     Wt Readings from Last 3 Encounters:  11/19/22 227 lb (103 kg)  11/16/21 230 lb (104.3 kg)  11/04/21 224 lb 6.4 oz (101.8 kg)   General: Well developed, well nourished, NAD Lungs:Clear to ausculation bilaterally. No wheezes, rales, or rhonchi. Breathing is unlabored. Cardiovascular: RRR with S1 S2. No murmurs Extremities: No edema.  Neuro: Alert and oriented. No focal deficits. No facial asymmetry. MAE spontaneously. Psych: Responds to questions appropriately with normal affect.    ASSESSMENT/PLAN:    Bioprosthetic aortic valve stenosis with acute leaflet thrombosis: Found after admission for chest pain and NSTEMI while living in [place]. TEE showed bioprosthetic aoritc stenosis with a mean gradient of 30 mm hg. Placed on Eliquis. Follow up echo 2022 showed normal  EF with mean gradient back to baseline ~ 20mm hg and trivial PVL. Last seen 10/2021 with echo showing moderate PVL seen at 5:00 on BSA views, with a mean gradient of 8 mm hg. There is a significant increase in degree of PVL since TTE done 11/05/20. Interestingly, his gradients have never been this low. Felt to be secondary to leaflet thrombosis with structural degeneration. Labs found to be relatively normal with Haptoglobin 121, LDH 26 HCT 51.5 (HCT elevated but obscured by TRT induced  polycythemia). Echo today with trivial PVL with a mean gradient at with a peak at , and AVA at 1.37cm2. PVl improved and gradients appear to be back at baseline from 10/2020. Clinically the patient is doing very well with NYHA class I symptoms. Will have Dr. Clifton Foley review the echo. If needed, will have the patient return for labs. Plan one year follow up with our team and cardiologist in [place] in the interm. Continue on Eliquis and ASA. Tolerating this well  CAD: Cath in [place] showed mod non-obst CAD. LDL followed by Aaron Foley    Hypogonadism: Remains on testosterone cream. Reports recent testosterone levels within normal range however I am unable to find these results in care everywhere.    Medication Adjustments/Labs and Tests Ordered: Current medicines are reviewed at length with the patient today.  Concerns regarding medicines are outlined above.  No orders of the defined types were placed in this encounter.  No orders of the defined types were placed in this encounter.   Patient Instructions  Medication Instructions:  Your physician recommends that you continue on your current medications as directed. Please refer to the Current Medication list given to you today.  *If you need a refill on your cardiac medications before your next appointment, please call your pharmacy*   Lab Work: NONE If you have labs (blood work) drawn today and your tests are completely normal, you will receive your results only  by: MyChart Message (if you have MyChart) OR A paper copy in the mail If you have any lab test that is abnormal or we need to change your treatment, we will call you to review the results.   Testing/Procedures: NONE   Follow-Up: At [facility], you and your health needs are our priority.  As part of our continuing mission to provide you with exceptional heart care, we have created designated Provider Care Teams.  These Care Teams include your primary Cardiologist (physician) and Advanced Practice Providers (APPs -  Physician Assistants and Nurse Practitioners) who all work together to provide you with the care you need, when you need it.  We recommend signing up for the patient portal called "MyChart".  Sign up information is provided on this After Visit Summary.  MyChart is used to connect with patients for Virtual Visits (Telemedicine).  Patients are able to view lab/test results, encounter notes, upcoming appointments, etc.  Non-urgent messages can be sent to your provider as well.   To learn more about what you can do with MyChart, go to [email or web address].    Your next appointment:   KEEP SCHEDULED FOLLOW-UP   Signed, Georgie Chard, NP  11/19/2022 3:37 PM    [facility]

## 2022-11-22 ENCOUNTER — Ambulatory Visit: Payer: Medicare HMO

## 2022-11-25 NOTE — Progress Notes (Signed)
Weight:       230.0 lb Date of Birth:  May 31, 1941             BSA:          2.215 m Patient Age:    81 years             BP:           162/84 mmHg Patient Gender: M                    HR:           59 bpm. Exam Location:  [address]  Procedure: 2D Echo, Cardiac Doppler and Color Doppler  Indications:    Z95.2 Status post TAVR  History:        Patient has prior history of Echocardiogram examinations, most recent 11/04/2021. CAD and Previous Myocardial Infarction, Aortic Valve Disease; Risk Factors:Hypertension, Dyslipidemia, Family History of Coronary Artery Disease and Former Smoker. NSTEMI, Aortic Stenosis status post TAVR (11-21-18, 26mm Edwards S3). Aortic Valve: 26 mm Sapien prosthetic, stented (TAVR) valve is present in the aortic position. Procedure Date: 11/21/2018.  Sonographer:    Farrel Conners RDCS Referring Phys: CAROLINE PROCHNAU  IMPRESSIONS   1. Left ventricular ejection fraction, by estimation, is 60 to 65%. The left ventricle has normal function. The left ventricle has no regional wall motion abnormalities. There is mild left ventricular hypertrophy. Left ventricular diastolic parameters are consistent with Grade I diastolic dysfunction (impaired relaxation). 2. Right ventricular systolic function is normal. The right ventricular size is normal. 3. The mitral valve is normal in structure. Trivial mitral valve regurgitation. No evidence of mitral stenosis. 4. Mild perivalvular leak. The aortic valve has been repaired/replaced. Aortic valve regurgitation is not visualized. No aortic stenosis is present. There is a 26 mm Sapien prosthetic (TAVR) valve present in the aortic position. Procedure  Date: 11/21/2018. Echo findings are consistent with perivalvular leak of the aortic prosthesis. Aortic valve area, by VTI measures 1.37 cm. Aortic valve mean gradient measures 20.0 mmHg. Aortic valve Vmax measures 3.04 m/s. 5. The inferior vena cava is normal in size with greater than 50% respiratory variability, suggesting right atrial pressure of 3 mmHg.  Comparison(s): Prior images reviewed side by side. Prior 11/04/21 TAVR mean gradient 8 mmHg.  FINDINGS Left Ventricle: Left ventricular ejection fraction, by estimation, is 60 to 65%. The left ventricle has normal function. The left ventricle has no regional wall motion abnormalities. The left ventricular internal cavity size was normal in size. There is mild left ventricular hypertrophy. Left ventricular diastolic parameters are consistent with Grade I diastolic dysfunction (impaired relaxation).  Right Ventricle: The right ventricular size is normal. No increase in right ventricular wall thickness. Right ventricular systolic function is normal.  Left Atrium: Left atrial size was normal in size.  Right Atrium: Right atrial size was normal in size.  Pericardium: There is no evidence of pericardial effusion.  Mitral Valve: The mitral valve is normal in structure. Trivial mitral valve regurgitation. No evidence of mitral valve stenosis.  Tricuspid Valve: The tricuspid valve is normal in structure. Tricuspid valve regurgitation is not demonstrated. No evidence of tricuspid stenosis.  Aortic Valve: Mild perivalvular leak. The aortic valve has been repaired/replaced. Aortic valve regurgitation is not visualized. Aortic regurgitation PHT measures 458 msec. No aortic stenosis is present. Aortic valve mean gradient measures 20.0 mmHg. Aortic valve peak gradient measures 37.0 mmHg. Aortic valve area, by VTI measures 1.37 cm. There is a 26 mm  Cardiology Office Note:    Date:  11/26/2022   ID:  Aaron Foley, DOB [birth year], MRN [id number]  PCP:  Philemon Kingdom, MD  Cardiologist:  Norman Herrlich, MD    Referring MD: Philemon Kingdom, MD    ASSESSMENT:    1. S/P TAVR (transcatheter aortic valve replacement)   2. Essential hypertension   3. Hypercholesterolemia   4. Coronary artery disease involving native heart without angina pectoris, unspecified vessel or lesion type    PLAN:    In order of problems listed above:  He continues to do well with TAVR and bioprosthetic leaflet restriction and is anticoagulated with clinical improvement and remain on Eliquis for rehab and follow-up echocardiogram in March and see me afterwards Stable Home blood pressures run 1 30-1 40/60-70 Continue statin Stable CAD continue his current medical treatment having no anginal discomfort Recent labs performed with his PCP hemoglobin A1c 6.5% he is committed to weight loss and activity to manage his prediabetes   Next appointment: March 2025   Medication Adjustments/Labs and Tests Ordered: Current medicines are reviewed at length with the patient today.  Concerns regarding medicines are outlined above.  Orders Placed This Encounter  Procedures   EKG 12-Lead   Meds ordered this encounter  Medications   ELIQUIS 5 MG TABS tablet    Sig: Take 1 tablet (5 mg total) by mouth 2 (two) times daily.    Dispense:  180 tablet    Refill:  3    This prescription was filled on 01/14/2022. Any refills authorized will be placed on file.    Chief Complaint  Patient presents with   Annual Exam    History of Present Illness:    Aaron Foley is a 82 y.o. 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  He was last seen 11/16/2021.  His most recent echocardiogram 11/19/2022 shows normal left ventricular size function mild LVH right ventricle normal size and function he has mild perivalvular leak TAVR function otherwise normal.    Compliance with diet, lifestyle and medications: Yes  He is enjoying life he has polycythemia has intermittent phlebotomy in [place] attributed to altitude and testosterone He continues anticoagulation is not having cardiovascular symptoms of edema shortness of breath chest pain palpitation or syncope and his valve does not appear to be symptomatic Past Medical History:  Diagnosis Date   Aortic stenosis    BPH (benign prostatic hyperplasia) 08/03/2020   Last Assessment & Plan:  Formatting of this note might be different from the original. Chronic S/p urological intervention  On no medications chronically  Plan: -Continue to monitor -Consider flomax if evidence of urinary retention   Class 1 obesity in adult 08/03/2020   Last Assessment & Plan:  Formatting of this note might be different from the original. BMI 32.42 Complicates all aspects of care  Plan: -Continue to encourage diet and lifestyle modifications resulting in long term weight loss   Closed left ankle fracture 2014   Coronary artery disease involving native coronary artery 08/04/2020   Formatting of this note is different from the original. Left heart cath with Dr. Micheal Likens, 08/05/20: LEFT MAIN coronary artery: Large-caliber artery which divides into LAD and circumflex.  It has diffuse up to 40-50% stenosis. LEFT ANTERIOR DESCENDING coronary artery: Large caliber vessel. It  Cardiology Office Note:    Date:  11/26/2022   ID:  Aaron Foley, DOB [birth year], MRN [id number]  PCP:  Philemon Kingdom, MD  Cardiologist:  Norman Herrlich, MD    Referring MD: Philemon Kingdom, MD    ASSESSMENT:    1. S/P TAVR (transcatheter aortic valve replacement)   2. Essential hypertension   3. Hypercholesterolemia   4. Coronary artery disease involving native heart without angina pectoris, unspecified vessel or lesion type    PLAN:    In order of problems listed above:  He continues to do well with TAVR and bioprosthetic leaflet restriction and is anticoagulated with clinical improvement and remain on Eliquis for rehab and follow-up echocardiogram in March and see me afterwards Stable Home blood pressures run 1 30-1 40/60-70 Continue statin Stable CAD continue his current medical treatment having no anginal discomfort Recent labs performed with his PCP hemoglobin A1c 6.5% he is committed to weight loss and activity to manage his prediabetes   Next appointment: March 2025   Medication Adjustments/Labs and Tests Ordered: Current medicines are reviewed at length with the patient today.  Concerns regarding medicines are outlined above.  Orders Placed This Encounter  Procedures   EKG 12-Lead   Meds ordered this encounter  Medications   ELIQUIS 5 MG TABS tablet    Sig: Take 1 tablet (5 mg total) by mouth 2 (two) times daily.    Dispense:  180 tablet    Refill:  3    This prescription was filled on 01/14/2022. Any refills authorized will be placed on file.    Chief Complaint  Patient presents with   Annual Exam    History of Present Illness:    Aaron Foley is a 82 y.o. 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  He was last seen 11/16/2021.  His most recent echocardiogram 11/19/2022 shows normal left ventricular size function mild LVH right ventricle normal size and function he has mild perivalvular leak TAVR function otherwise normal.    Compliance with diet, lifestyle and medications: Yes  He is enjoying life he has polycythemia has intermittent phlebotomy in [place] attributed to altitude and testosterone He continues anticoagulation is not having cardiovascular symptoms of edema shortness of breath chest pain palpitation or syncope and his valve does not appear to be symptomatic Past Medical History:  Diagnosis Date   Aortic stenosis    BPH (benign prostatic hyperplasia) 08/03/2020   Last Assessment & Plan:  Formatting of this note might be different from the original. Chronic S/p urological intervention  On no medications chronically  Plan: -Continue to monitor -Consider flomax if evidence of urinary retention   Class 1 obesity in adult 08/03/2020   Last Assessment & Plan:  Formatting of this note might be different from the original. BMI 32.42 Complicates all aspects of care  Plan: -Continue to encourage diet and lifestyle modifications resulting in long term weight loss   Closed left ankle fracture 2014   Coronary artery disease involving native coronary artery 08/04/2020   Formatting of this note is different from the original. Left heart cath with Dr. Micheal Likens, 08/05/20: LEFT MAIN coronary artery: Large-caliber artery which divides into LAD and circumflex.  It has diffuse up to 40-50% stenosis. LEFT ANTERIOR DESCENDING coronary artery: Large caliber vessel. It  Weight:       230.0 lb Date of Birth:  May 31, 1941             BSA:          2.215 m Patient Age:    81 years             BP:           162/84 mmHg Patient Gender: M                    HR:           59 bpm. Exam Location:  [address]  Procedure: 2D Echo, Cardiac Doppler and Color Doppler  Indications:    Z95.2 Status post TAVR  History:        Patient has prior history of Echocardiogram examinations, most recent 11/04/2021. CAD and Previous Myocardial Infarction, Aortic Valve Disease; Risk Factors:Hypertension, Dyslipidemia, Family History of Coronary Artery Disease and Former Smoker. NSTEMI, Aortic Stenosis status post TAVR (11-21-18, 26mm Edwards S3). Aortic Valve: 26 mm Sapien prosthetic, stented (TAVR) valve is present in the aortic position. Procedure Date: 11/21/2018.  Sonographer:    Farrel Conners RDCS Referring Phys: CAROLINE PROCHNAU  IMPRESSIONS   1. Left ventricular ejection fraction, by estimation, is 60 to 65%. The left ventricle has normal function. The left ventricle has no regional wall motion abnormalities. There is mild left ventricular hypertrophy. Left ventricular diastolic parameters are consistent with Grade I diastolic dysfunction (impaired relaxation). 2. Right ventricular systolic function is normal. The right ventricular size is normal. 3. The mitral valve is normal in structure. Trivial mitral valve regurgitation. No evidence of mitral stenosis. 4. Mild perivalvular leak. The aortic valve has been repaired/replaced. Aortic valve regurgitation is not visualized. No aortic stenosis is present. There is a 26 mm Sapien prosthetic (TAVR) valve present in the aortic position. Procedure  Date: 11/21/2018. Echo findings are consistent with perivalvular leak of the aortic prosthesis. Aortic valve area, by VTI measures 1.37 cm. Aortic valve mean gradient measures 20.0 mmHg. Aortic valve Vmax measures 3.04 m/s. 5. The inferior vena cava is normal in size with greater than 50% respiratory variability, suggesting right atrial pressure of 3 mmHg.  Comparison(s): Prior images reviewed side by side. Prior 11/04/21 TAVR mean gradient 8 mmHg.  FINDINGS Left Ventricle: Left ventricular ejection fraction, by estimation, is 60 to 65%. The left ventricle has normal function. The left ventricle has no regional wall motion abnormalities. The left ventricular internal cavity size was normal in size. There is mild left ventricular hypertrophy. Left ventricular diastolic parameters are consistent with Grade I diastolic dysfunction (impaired relaxation).  Right Ventricle: The right ventricular size is normal. No increase in right ventricular wall thickness. Right ventricular systolic function is normal.  Left Atrium: Left atrial size was normal in size.  Right Atrium: Right atrial size was normal in size.  Pericardium: There is no evidence of pericardial effusion.  Mitral Valve: The mitral valve is normal in structure. Trivial mitral valve regurgitation. No evidence of mitral valve stenosis.  Tricuspid Valve: The tricuspid valve is normal in structure. Tricuspid valve regurgitation is not demonstrated. No evidence of tricuspid stenosis.  Aortic Valve: Mild perivalvular leak. The aortic valve has been repaired/replaced. Aortic valve regurgitation is not visualized. Aortic regurgitation PHT measures 458 msec. No aortic stenosis is present. Aortic valve mean gradient measures 20.0 mmHg. Aortic valve peak gradient measures 37.0 mmHg. Aortic valve area, by VTI measures 1.37 cm. There is a 26 mm  Weight:       230.0 lb Date of Birth:  May 31, 1941             BSA:          2.215 m Patient Age:    81 years             BP:           162/84 mmHg Patient Gender: M                    HR:           59 bpm. Exam Location:  [address]  Procedure: 2D Echo, Cardiac Doppler and Color Doppler  Indications:    Z95.2 Status post TAVR  History:        Patient has prior history of Echocardiogram examinations, most recent 11/04/2021. CAD and Previous Myocardial Infarction, Aortic Valve Disease; Risk Factors:Hypertension, Dyslipidemia, Family History of Coronary Artery Disease and Former Smoker. NSTEMI, Aortic Stenosis status post TAVR (11-21-18, 26mm Edwards S3). Aortic Valve: 26 mm Sapien prosthetic, stented (TAVR) valve is present in the aortic position. Procedure Date: 11/21/2018.  Sonographer:    Farrel Conners RDCS Referring Phys: CAROLINE PROCHNAU  IMPRESSIONS   1. Left ventricular ejection fraction, by estimation, is 60 to 65%. The left ventricle has normal function. The left ventricle has no regional wall motion abnormalities. There is mild left ventricular hypertrophy. Left ventricular diastolic parameters are consistent with Grade I diastolic dysfunction (impaired relaxation). 2. Right ventricular systolic function is normal. The right ventricular size is normal. 3. The mitral valve is normal in structure. Trivial mitral valve regurgitation. No evidence of mitral stenosis. 4. Mild perivalvular leak. The aortic valve has been repaired/replaced. Aortic valve regurgitation is not visualized. No aortic stenosis is present. There is a 26 mm Sapien prosthetic (TAVR) valve present in the aortic position. Procedure  Date: 11/21/2018. Echo findings are consistent with perivalvular leak of the aortic prosthesis. Aortic valve area, by VTI measures 1.37 cm. Aortic valve mean gradient measures 20.0 mmHg. Aortic valve Vmax measures 3.04 m/s. 5. The inferior vena cava is normal in size with greater than 50% respiratory variability, suggesting right atrial pressure of 3 mmHg.  Comparison(s): Prior images reviewed side by side. Prior 11/04/21 TAVR mean gradient 8 mmHg.  FINDINGS Left Ventricle: Left ventricular ejection fraction, by estimation, is 60 to 65%. The left ventricle has normal function. The left ventricle has no regional wall motion abnormalities. The left ventricular internal cavity size was normal in size. There is mild left ventricular hypertrophy. Left ventricular diastolic parameters are consistent with Grade I diastolic dysfunction (impaired relaxation).  Right Ventricle: The right ventricular size is normal. No increase in right ventricular wall thickness. Right ventricular systolic function is normal.  Left Atrium: Left atrial size was normal in size.  Right Atrium: Right atrial size was normal in size.  Pericardium: There is no evidence of pericardial effusion.  Mitral Valve: The mitral valve is normal in structure. Trivial mitral valve regurgitation. No evidence of mitral valve stenosis.  Tricuspid Valve: The tricuspid valve is normal in structure. Tricuspid valve regurgitation is not demonstrated. No evidence of tricuspid stenosis.  Aortic Valve: Mild perivalvular leak. The aortic valve has been repaired/replaced. Aortic valve regurgitation is not visualized. Aortic regurgitation PHT measures 458 msec. No aortic stenosis is present. Aortic valve mean gradient measures 20.0 mmHg. Aortic valve peak gradient measures 37.0 mmHg. Aortic valve area, by VTI measures 1.37 cm. There is a 26 mm  Weight:       230.0 lb Date of Birth:  May 31, 1941             BSA:          2.215 m Patient Age:    81 years             BP:           162/84 mmHg Patient Gender: M                    HR:           59 bpm. Exam Location:  [address]  Procedure: 2D Echo, Cardiac Doppler and Color Doppler  Indications:    Z95.2 Status post TAVR  History:        Patient has prior history of Echocardiogram examinations, most recent 11/04/2021. CAD and Previous Myocardial Infarction, Aortic Valve Disease; Risk Factors:Hypertension, Dyslipidemia, Family History of Coronary Artery Disease and Former Smoker. NSTEMI, Aortic Stenosis status post TAVR (11-21-18, 26mm Edwards S3). Aortic Valve: 26 mm Sapien prosthetic, stented (TAVR) valve is present in the aortic position. Procedure Date: 11/21/2018.  Sonographer:    Farrel Conners RDCS Referring Phys: CAROLINE PROCHNAU  IMPRESSIONS   1. Left ventricular ejection fraction, by estimation, is 60 to 65%. The left ventricle has normal function. The left ventricle has no regional wall motion abnormalities. There is mild left ventricular hypertrophy. Left ventricular diastolic parameters are consistent with Grade I diastolic dysfunction (impaired relaxation). 2. Right ventricular systolic function is normal. The right ventricular size is normal. 3. The mitral valve is normal in structure. Trivial mitral valve regurgitation. No evidence of mitral stenosis. 4. Mild perivalvular leak. The aortic valve has been repaired/replaced. Aortic valve regurgitation is not visualized. No aortic stenosis is present. There is a 26 mm Sapien prosthetic (TAVR) valve present in the aortic position. Procedure  Date: 11/21/2018. Echo findings are consistent with perivalvular leak of the aortic prosthesis. Aortic valve area, by VTI measures 1.37 cm. Aortic valve mean gradient measures 20.0 mmHg. Aortic valve Vmax measures 3.04 m/s. 5. The inferior vena cava is normal in size with greater than 50% respiratory variability, suggesting right atrial pressure of 3 mmHg.  Comparison(s): Prior images reviewed side by side. Prior 11/04/21 TAVR mean gradient 8 mmHg.  FINDINGS Left Ventricle: Left ventricular ejection fraction, by estimation, is 60 to 65%. The left ventricle has normal function. The left ventricle has no regional wall motion abnormalities. The left ventricular internal cavity size was normal in size. There is mild left ventricular hypertrophy. Left ventricular diastolic parameters are consistent with Grade I diastolic dysfunction (impaired relaxation).  Right Ventricle: The right ventricular size is normal. No increase in right ventricular wall thickness. Right ventricular systolic function is normal.  Left Atrium: Left atrial size was normal in size.  Right Atrium: Right atrial size was normal in size.  Pericardium: There is no evidence of pericardial effusion.  Mitral Valve: The mitral valve is normal in structure. Trivial mitral valve regurgitation. No evidence of mitral valve stenosis.  Tricuspid Valve: The tricuspid valve is normal in structure. Tricuspid valve regurgitation is not demonstrated. No evidence of tricuspid stenosis.  Aortic Valve: Mild perivalvular leak. The aortic valve has been repaired/replaced. Aortic valve regurgitation is not visualized. Aortic regurgitation PHT measures 458 msec. No aortic stenosis is present. Aortic valve mean gradient measures 20.0 mmHg. Aortic valve peak gradient measures 37.0 mmHg. Aortic valve area, by VTI measures 1.37 cm. There is a 26 mm  Weight:       230.0 lb Date of Birth:  May 31, 1941             BSA:          2.215 m Patient Age:    81 years             BP:           162/84 mmHg Patient Gender: M                    HR:           59 bpm. Exam Location:  [address]  Procedure: 2D Echo, Cardiac Doppler and Color Doppler  Indications:    Z95.2 Status post TAVR  History:        Patient has prior history of Echocardiogram examinations, most recent 11/04/2021. CAD and Previous Myocardial Infarction, Aortic Valve Disease; Risk Factors:Hypertension, Dyslipidemia, Family History of Coronary Artery Disease and Former Smoker. NSTEMI, Aortic Stenosis status post TAVR (11-21-18, 26mm Edwards S3). Aortic Valve: 26 mm Sapien prosthetic, stented (TAVR) valve is present in the aortic position. Procedure Date: 11/21/2018.  Sonographer:    Farrel Conners RDCS Referring Phys: CAROLINE PROCHNAU  IMPRESSIONS   1. Left ventricular ejection fraction, by estimation, is 60 to 65%. The left ventricle has normal function. The left ventricle has no regional wall motion abnormalities. There is mild left ventricular hypertrophy. Left ventricular diastolic parameters are consistent with Grade I diastolic dysfunction (impaired relaxation). 2. Right ventricular systolic function is normal. The right ventricular size is normal. 3. The mitral valve is normal in structure. Trivial mitral valve regurgitation. No evidence of mitral stenosis. 4. Mild perivalvular leak. The aortic valve has been repaired/replaced. Aortic valve regurgitation is not visualized. No aortic stenosis is present. There is a 26 mm Sapien prosthetic (TAVR) valve present in the aortic position. Procedure  Date: 11/21/2018. Echo findings are consistent with perivalvular leak of the aortic prosthesis. Aortic valve area, by VTI measures 1.37 cm. Aortic valve mean gradient measures 20.0 mmHg. Aortic valve Vmax measures 3.04 m/s. 5. The inferior vena cava is normal in size with greater than 50% respiratory variability, suggesting right atrial pressure of 3 mmHg.  Comparison(s): Prior images reviewed side by side. Prior 11/04/21 TAVR mean gradient 8 mmHg.  FINDINGS Left Ventricle: Left ventricular ejection fraction, by estimation, is 60 to 65%. The left ventricle has normal function. The left ventricle has no regional wall motion abnormalities. The left ventricular internal cavity size was normal in size. There is mild left ventricular hypertrophy. Left ventricular diastolic parameters are consistent with Grade I diastolic dysfunction (impaired relaxation).  Right Ventricle: The right ventricular size is normal. No increase in right ventricular wall thickness. Right ventricular systolic function is normal.  Left Atrium: Left atrial size was normal in size.  Right Atrium: Right atrial size was normal in size.  Pericardium: There is no evidence of pericardial effusion.  Mitral Valve: The mitral valve is normal in structure. Trivial mitral valve regurgitation. No evidence of mitral valve stenosis.  Tricuspid Valve: The tricuspid valve is normal in structure. Tricuspid valve regurgitation is not demonstrated. No evidence of tricuspid stenosis.  Aortic Valve: Mild perivalvular leak. The aortic valve has been repaired/replaced. Aortic valve regurgitation is not visualized. Aortic regurgitation PHT measures 458 msec. No aortic stenosis is present. Aortic valve mean gradient measures 20.0 mmHg. Aortic valve peak gradient measures 37.0 mmHg. Aortic valve area, by VTI measures 1.37 cm. There is a 26 mm

## 2022-11-26 ENCOUNTER — Encounter: Payer: Self-pay | Admitting: Cardiology

## 2022-11-26 ENCOUNTER — Ambulatory Visit: Payer: Medicare HMO | Attending: Cardiology | Admitting: Cardiology

## 2022-11-26 VITALS — BP 150/80 | HR 62 | Ht 71.0 in | Wt 229.0 lb

## 2022-11-26 DIAGNOSIS — I2510 Atherosclerotic heart disease of native coronary artery without angina pectoris: Secondary | ICD-10-CM | POA: Diagnosis not present

## 2022-11-26 DIAGNOSIS — E7800 Pure hypercholesterolemia, unspecified: Secondary | ICD-10-CM

## 2022-11-26 DIAGNOSIS — I10 Essential (primary) hypertension: Secondary | ICD-10-CM | POA: Diagnosis not present

## 2022-11-26 DIAGNOSIS — Z952 Presence of prosthetic heart valve: Secondary | ICD-10-CM

## 2022-11-26 MED ORDER — ELIQUIS 5 MG PO TABS: 5.0000 mg | ORAL_TABLET | Freq: Two times a day (BID) | ORAL | 3 refills | Status: AC

## 2022-11-26 NOTE — Patient Instructions (Signed)
Medication Instructions:  Your physician recommends that you continue on your current medications as directed. Please refer to the Current Medication list given to you today.  *If you need a refill on your cardiac medications before your next appointment, please call your pharmacy*   Lab Work: None If you have labs (blood work) drawn today and your tests are completely normal, you will receive your results only by: MyChart Message (if you have MyChart) OR A paper copy in the mail If you have any lab test that is abnormal or we need to change your treatment, we will call you to review the results.   Testing/Procedures: None   Follow-Up: At [facility], you and your health needs are our priority.  As part of our continuing mission to provide you with exceptional heart care, we have created designated Provider Care Teams.  These Care Teams include your primary Cardiologist (physician) and Advanced Practice Providers (APPs -  Physician Assistants and Nurse Practitioners) who all work together to provide you with the care you need, when you need it.  We recommend signing up for the patient portal called "MyChart".  Sign up information is provided on this After Visit Summary.  MyChart is used to connect with patients for Virtual Visits (Telemedicine).  Patients are able to view lab/test results, encounter notes, upcoming appointments, etc.  Non-urgent messages can be sent to your provider as well.   To learn more about what you can do with MyChart, go to [email or web address].    Your next appointment:   11 month(s)  Provider:   Norman Herrlich, MD    Other Instructions None

## 2022-12-06 DIAGNOSIS — D751 Secondary polycythemia: Secondary | ICD-10-CM | POA: Diagnosis not present

## 2023-01-03 DIAGNOSIS — D751 Secondary polycythemia: Secondary | ICD-10-CM | POA: Diagnosis not present

## 2023-01-28 DIAGNOSIS — Z953 Presence of xenogenic heart valve: Secondary | ICD-10-CM | POA: Diagnosis not present

## 2023-01-28 DIAGNOSIS — R0789 Other chest pain: Secondary | ICD-10-CM | POA: Insufficient documentation

## 2023-01-28 DIAGNOSIS — E785 Hyperlipidemia, unspecified: Secondary | ICD-10-CM | POA: Diagnosis not present

## 2023-01-28 DIAGNOSIS — I25118 Atherosclerotic heart disease of native coronary artery with other forms of angina pectoris: Secondary | ICD-10-CM | POA: Diagnosis not present

## 2023-01-28 DIAGNOSIS — I10 Essential (primary) hypertension: Secondary | ICD-10-CM | POA: Diagnosis not present

## 2023-01-28 HISTORY — DX: Other chest pain: R07.89

## 2023-01-31 DIAGNOSIS — D751 Secondary polycythemia: Secondary | ICD-10-CM | POA: Diagnosis not present

## 2023-02-02 ENCOUNTER — Other Ambulatory Visit: Payer: Self-pay | Admitting: Cardiology

## 2023-02-07 DIAGNOSIS — Z952 Presence of prosthetic heart valve: Secondary | ICD-10-CM | POA: Diagnosis not present

## 2023-02-07 DIAGNOSIS — I10 Essential (primary) hypertension: Secondary | ICD-10-CM | POA: Diagnosis not present

## 2023-02-07 DIAGNOSIS — R0789 Other chest pain: Secondary | ICD-10-CM | POA: Diagnosis not present

## 2023-02-07 DIAGNOSIS — I6523 Occlusion and stenosis of bilateral carotid arteries: Secondary | ICD-10-CM | POA: Diagnosis not present

## 2023-02-07 DIAGNOSIS — Z79899 Other long term (current) drug therapy: Secondary | ICD-10-CM | POA: Diagnosis not present

## 2023-02-07 DIAGNOSIS — I25118 Atherosclerotic heart disease of native coronary artery with other forms of angina pectoris: Secondary | ICD-10-CM | POA: Diagnosis not present

## 2023-02-07 DIAGNOSIS — I517 Cardiomegaly: Secondary | ICD-10-CM | POA: Diagnosis not present

## 2023-02-07 DIAGNOSIS — I3481 Nonrheumatic mitral (valve) annulus calcification: Secondary | ICD-10-CM | POA: Diagnosis not present

## 2023-02-07 DIAGNOSIS — Z79811 Long term (current) use of aromatase inhibitors: Secondary | ICD-10-CM | POA: Diagnosis not present

## 2023-02-07 DIAGNOSIS — E785 Hyperlipidemia, unspecified: Secondary | ICD-10-CM | POA: Diagnosis not present

## 2023-02-07 DIAGNOSIS — Z7901 Long term (current) use of anticoagulants: Secondary | ICD-10-CM | POA: Diagnosis not present

## 2023-02-07 DIAGNOSIS — Z7982 Long term (current) use of aspirin: Secondary | ICD-10-CM | POA: Diagnosis not present

## 2023-02-08 DIAGNOSIS — I25118 Atherosclerotic heart disease of native coronary artery with other forms of angina pectoris: Secondary | ICD-10-CM | POA: Diagnosis not present

## 2023-02-08 DIAGNOSIS — Z953 Presence of xenogenic heart valve: Secondary | ICD-10-CM | POA: Diagnosis not present

## 2023-02-23 DIAGNOSIS — I25118 Atherosclerotic heart disease of native coronary artery with other forms of angina pectoris: Secondary | ICD-10-CM | POA: Diagnosis not present

## 2023-03-07 DIAGNOSIS — D751 Secondary polycythemia: Secondary | ICD-10-CM | POA: Diagnosis not present

## 2023-03-25 DIAGNOSIS — R059 Cough, unspecified: Secondary | ICD-10-CM | POA: Diagnosis not present

## 2023-03-29 DIAGNOSIS — R9431 Abnormal electrocardiogram [ECG] [EKG]: Secondary | ICD-10-CM | POA: Diagnosis not present

## 2023-03-30 DIAGNOSIS — D649 Anemia, unspecified: Secondary | ICD-10-CM | POA: Diagnosis not present

## 2023-03-30 DIAGNOSIS — I350 Nonrheumatic aortic (valve) stenosis: Secondary | ICD-10-CM | POA: Diagnosis not present

## 2023-03-30 DIAGNOSIS — I480 Paroxysmal atrial fibrillation: Secondary | ICD-10-CM | POA: Diagnosis not present

## 2023-03-30 DIAGNOSIS — D72829 Elevated white blood cell count, unspecified: Secondary | ICD-10-CM

## 2023-03-30 DIAGNOSIS — R7402 Elevation of levels of lactic acid dehydrogenase (LDH): Secondary | ICD-10-CM | POA: Diagnosis not present

## 2023-03-30 DIAGNOSIS — Z951 Presence of aortocoronary bypass graft: Secondary | ICD-10-CM | POA: Diagnosis not present

## 2023-03-30 DIAGNOSIS — T82867A Thrombosis of cardiac prosthetic devices, implants and grafts, initial encounter: Secondary | ICD-10-CM | POA: Diagnosis not present

## 2023-03-30 DIAGNOSIS — J9601 Acute respiratory failure with hypoxia: Secondary | ICD-10-CM

## 2023-03-30 DIAGNOSIS — E785 Hyperlipidemia, unspecified: Secondary | ICD-10-CM | POA: Diagnosis not present

## 2023-03-30 DIAGNOSIS — I25118 Atherosclerotic heart disease of native coronary artery with other forms of angina pectoris: Secondary | ICD-10-CM | POA: Diagnosis not present

## 2023-03-30 DIAGNOSIS — R918 Other nonspecific abnormal finding of lung field: Secondary | ICD-10-CM | POA: Diagnosis not present

## 2023-03-30 DIAGNOSIS — N400 Enlarged prostate without lower urinary tract symptoms: Secondary | ICD-10-CM | POA: Diagnosis not present

## 2023-03-30 DIAGNOSIS — Z6831 Body mass index (BMI) 31.0-31.9, adult: Secondary | ICD-10-CM | POA: Diagnosis not present

## 2023-03-30 DIAGNOSIS — K567 Ileus, unspecified: Secondary | ICD-10-CM | POA: Diagnosis not present

## 2023-03-30 DIAGNOSIS — I499 Cardiac arrhythmia, unspecified: Secondary | ICD-10-CM | POA: Diagnosis not present

## 2023-03-30 DIAGNOSIS — Y838 Other surgical procedures as the cause of abnormal reaction of the patient, or of later complication, without mention of misadventure at the time of the procedure: Secondary | ICD-10-CM | POA: Diagnosis not present

## 2023-03-30 DIAGNOSIS — I10 Essential (primary) hypertension: Secondary | ICD-10-CM | POA: Diagnosis not present

## 2023-03-30 DIAGNOSIS — I272 Pulmonary hypertension, unspecified: Secondary | ICD-10-CM | POA: Diagnosis not present

## 2023-03-30 DIAGNOSIS — G8918 Other acute postprocedural pain: Secondary | ICD-10-CM | POA: Diagnosis not present

## 2023-03-30 DIAGNOSIS — Z952 Presence of prosthetic heart valve: Secondary | ICD-10-CM | POA: Diagnosis not present

## 2023-03-30 DIAGNOSIS — I25119 Atherosclerotic heart disease of native coronary artery with unspecified angina pectoris: Secondary | ICD-10-CM | POA: Diagnosis not present

## 2023-03-30 DIAGNOSIS — N179 Acute kidney failure, unspecified: Secondary | ICD-10-CM | POA: Diagnosis not present

## 2023-03-30 DIAGNOSIS — J90 Pleural effusion, not elsewhere classified: Secondary | ICD-10-CM | POA: Diagnosis not present

## 2023-03-30 DIAGNOSIS — I25110 Atherosclerotic heart disease of native coronary artery with unstable angina pectoris: Secondary | ICD-10-CM | POA: Diagnosis not present

## 2023-03-30 DIAGNOSIS — E871 Hypo-osmolality and hyponatremia: Secondary | ICD-10-CM | POA: Diagnosis not present

## 2023-03-30 DIAGNOSIS — Z452 Encounter for adjustment and management of vascular access device: Secondary | ICD-10-CM | POA: Diagnosis not present

## 2023-03-30 DIAGNOSIS — J9811 Atelectasis: Secondary | ICD-10-CM | POA: Diagnosis not present

## 2023-03-30 DIAGNOSIS — I5032 Chronic diastolic (congestive) heart failure: Secondary | ICD-10-CM | POA: Diagnosis not present

## 2023-03-30 DIAGNOSIS — I130 Hypertensive heart and chronic kidney disease with heart failure and stage 1 through stage 4 chronic kidney disease, or unspecified chronic kidney disease: Secondary | ICD-10-CM | POA: Diagnosis not present

## 2023-03-30 DIAGNOSIS — K6389 Other specified diseases of intestine: Secondary | ICD-10-CM | POA: Diagnosis not present

## 2023-03-30 DIAGNOSIS — I513 Intracardiac thrombosis, not elsewhere classified: Secondary | ICD-10-CM | POA: Diagnosis not present

## 2023-03-30 DIAGNOSIS — E872 Acidosis, unspecified: Secondary | ICD-10-CM | POA: Insufficient documentation

## 2023-03-30 DIAGNOSIS — I2510 Atherosclerotic heart disease of native coronary artery without angina pectoris: Secondary | ICD-10-CM | POA: Diagnosis not present

## 2023-03-30 DIAGNOSIS — K219 Gastro-esophageal reflux disease without esophagitis: Secondary | ICD-10-CM | POA: Diagnosis not present

## 2023-03-30 DIAGNOSIS — E669 Obesity, unspecified: Secondary | ICD-10-CM | POA: Diagnosis not present

## 2023-03-30 HISTORY — DX: Acidosis, unspecified: E87.20

## 2023-03-30 HISTORY — DX: Presence of aortocoronary bypass graft: Z95.1

## 2023-03-30 HISTORY — DX: Acute respiratory failure with hypoxia: J96.01

## 2023-03-30 HISTORY — DX: Elevated white blood cell count, unspecified: D72.829

## 2023-04-07 DIAGNOSIS — R3589 Other polyuria: Secondary | ICD-10-CM | POA: Diagnosis not present

## 2023-04-07 DIAGNOSIS — D62 Acute posthemorrhagic anemia: Secondary | ICD-10-CM | POA: Diagnosis not present

## 2023-04-07 DIAGNOSIS — J90 Pleural effusion, not elsewhere classified: Secondary | ICD-10-CM | POA: Diagnosis not present

## 2023-04-07 DIAGNOSIS — Z5181 Encounter for therapeutic drug level monitoring: Secondary | ICD-10-CM | POA: Diagnosis not present

## 2023-04-07 DIAGNOSIS — Z7901 Long term (current) use of anticoagulants: Secondary | ICD-10-CM | POA: Diagnosis not present

## 2023-04-11 DIAGNOSIS — Z953 Presence of xenogenic heart valve: Secondary | ICD-10-CM | POA: Diagnosis not present

## 2023-04-11 DIAGNOSIS — Z7901 Long term (current) use of anticoagulants: Secondary | ICD-10-CM | POA: Diagnosis not present

## 2023-04-12 DIAGNOSIS — I25118 Atherosclerotic heart disease of native coronary artery with other forms of angina pectoris: Secondary | ICD-10-CM | POA: Diagnosis not present

## 2023-04-15 DIAGNOSIS — Z7901 Long term (current) use of anticoagulants: Secondary | ICD-10-CM | POA: Diagnosis not present

## 2023-04-15 DIAGNOSIS — Z953 Presence of xenogenic heart valve: Secondary | ICD-10-CM | POA: Diagnosis not present

## 2023-04-18 DIAGNOSIS — Z953 Presence of xenogenic heart valve: Secondary | ICD-10-CM | POA: Diagnosis not present

## 2023-04-20 DIAGNOSIS — K5909 Other constipation: Secondary | ICD-10-CM | POA: Diagnosis not present

## 2023-04-20 DIAGNOSIS — I25118 Atherosclerotic heart disease of native coronary artery with other forms of angina pectoris: Secondary | ICD-10-CM | POA: Diagnosis not present

## 2023-04-22 DIAGNOSIS — Z7901 Long term (current) use of anticoagulants: Secondary | ICD-10-CM | POA: Diagnosis not present

## 2023-04-22 DIAGNOSIS — Z8679 Personal history of other diseases of the circulatory system: Secondary | ICD-10-CM | POA: Diagnosis not present

## 2023-04-22 DIAGNOSIS — I350 Nonrheumatic aortic (valve) stenosis: Secondary | ICD-10-CM | POA: Diagnosis not present

## 2023-04-25 DIAGNOSIS — I350 Nonrheumatic aortic (valve) stenosis: Secondary | ICD-10-CM | POA: Diagnosis not present

## 2023-04-25 DIAGNOSIS — Z7901 Long term (current) use of anticoagulants: Secondary | ICD-10-CM | POA: Diagnosis not present

## 2023-04-29 DIAGNOSIS — Z7901 Long term (current) use of anticoagulants: Secondary | ICD-10-CM | POA: Diagnosis not present

## 2023-04-29 DIAGNOSIS — Z953 Presence of xenogenic heart valve: Secondary | ICD-10-CM | POA: Diagnosis not present

## 2023-05-06 DIAGNOSIS — Z953 Presence of xenogenic heart valve: Secondary | ICD-10-CM | POA: Diagnosis not present

## 2023-05-06 DIAGNOSIS — I214 Non-ST elevation (NSTEMI) myocardial infarction: Secondary | ICD-10-CM | POA: Diagnosis not present

## 2023-05-06 DIAGNOSIS — Z7901 Long term (current) use of anticoagulants: Secondary | ICD-10-CM | POA: Diagnosis not present

## 2023-05-11 DIAGNOSIS — T82867A Thrombosis of cardiac prosthetic devices, implants and grafts, initial encounter: Secondary | ICD-10-CM | POA: Diagnosis not present

## 2023-05-11 DIAGNOSIS — Z953 Presence of xenogenic heart valve: Secondary | ICD-10-CM | POA: Diagnosis not present

## 2023-05-11 DIAGNOSIS — I25118 Atherosclerotic heart disease of native coronary artery with other forms of angina pectoris: Secondary | ICD-10-CM | POA: Diagnosis not present

## 2023-05-11 DIAGNOSIS — Z7901 Long term (current) use of anticoagulants: Secondary | ICD-10-CM | POA: Diagnosis not present

## 2023-05-12 DIAGNOSIS — Z952 Presence of prosthetic heart valve: Secondary | ICD-10-CM | POA: Diagnosis not present

## 2023-05-12 DIAGNOSIS — R7303 Prediabetes: Secondary | ICD-10-CM | POA: Diagnosis not present

## 2023-05-12 DIAGNOSIS — I7090 Unspecified atherosclerosis: Secondary | ICD-10-CM | POA: Diagnosis not present

## 2023-05-17 DIAGNOSIS — Z953 Presence of xenogenic heart valve: Secondary | ICD-10-CM | POA: Diagnosis not present

## 2023-05-17 DIAGNOSIS — Z7901 Long term (current) use of anticoagulants: Secondary | ICD-10-CM | POA: Diagnosis not present

## 2023-05-20 DIAGNOSIS — I25118 Atherosclerotic heart disease of native coronary artery with other forms of angina pectoris: Secondary | ICD-10-CM | POA: Diagnosis not present

## 2023-05-20 DIAGNOSIS — Z7901 Long term (current) use of anticoagulants: Secondary | ICD-10-CM | POA: Diagnosis not present

## 2023-05-24 DIAGNOSIS — I25118 Atherosclerotic heart disease of native coronary artery with other forms of angina pectoris: Secondary | ICD-10-CM | POA: Diagnosis not present

## 2023-05-24 DIAGNOSIS — Z7901 Long term (current) use of anticoagulants: Secondary | ICD-10-CM | POA: Diagnosis not present

## 2023-05-25 DIAGNOSIS — H2513 Age-related nuclear cataract, bilateral: Secondary | ICD-10-CM | POA: Diagnosis not present

## 2023-05-25 DIAGNOSIS — H5203 Hypermetropia, bilateral: Secondary | ICD-10-CM | POA: Diagnosis not present

## 2023-05-25 DIAGNOSIS — H52221 Regular astigmatism, right eye: Secondary | ICD-10-CM | POA: Diagnosis not present

## 2023-05-25 DIAGNOSIS — Z135 Encounter for screening for eye and ear disorders: Secondary | ICD-10-CM | POA: Diagnosis not present

## 2023-05-25 DIAGNOSIS — H353131 Nonexudative age-related macular degeneration, bilateral, early dry stage: Secondary | ICD-10-CM | POA: Diagnosis not present

## 2023-05-25 DIAGNOSIS — H524 Presbyopia: Secondary | ICD-10-CM | POA: Diagnosis not present

## 2023-05-27 DIAGNOSIS — Z953 Presence of xenogenic heart valve: Secondary | ICD-10-CM | POA: Diagnosis not present

## 2023-05-27 DIAGNOSIS — Z7901 Long term (current) use of anticoagulants: Secondary | ICD-10-CM | POA: Diagnosis not present

## 2023-05-31 DIAGNOSIS — Z953 Presence of xenogenic heart valve: Secondary | ICD-10-CM | POA: Diagnosis not present

## 2023-05-31 DIAGNOSIS — Z7901 Long term (current) use of anticoagulants: Secondary | ICD-10-CM | POA: Diagnosis not present

## 2023-06-03 DIAGNOSIS — I25118 Atherosclerotic heart disease of native coronary artery with other forms of angina pectoris: Secondary | ICD-10-CM | POA: Diagnosis not present

## 2023-06-07 DIAGNOSIS — Z7901 Long term (current) use of anticoagulants: Secondary | ICD-10-CM | POA: Diagnosis not present

## 2023-06-07 DIAGNOSIS — Z953 Presence of xenogenic heart valve: Secondary | ICD-10-CM | POA: Diagnosis not present

## 2023-06-14 DIAGNOSIS — Z953 Presence of xenogenic heart valve: Secondary | ICD-10-CM | POA: Diagnosis not present

## 2023-06-14 DIAGNOSIS — I2510 Atherosclerotic heart disease of native coronary artery without angina pectoris: Secondary | ICD-10-CM | POA: Diagnosis not present

## 2023-06-14 DIAGNOSIS — Z951 Presence of aortocoronary bypass graft: Secondary | ICD-10-CM | POA: Diagnosis not present

## 2023-06-14 DIAGNOSIS — T82867D Thrombosis of cardiac prosthetic devices, implants and grafts, subsequent encounter: Secondary | ICD-10-CM | POA: Diagnosis not present

## 2023-06-21 DIAGNOSIS — Z7901 Long term (current) use of anticoagulants: Secondary | ICD-10-CM | POA: Diagnosis not present

## 2023-06-21 DIAGNOSIS — Z953 Presence of xenogenic heart valve: Secondary | ICD-10-CM | POA: Diagnosis not present

## 2023-06-27 DIAGNOSIS — Z7901 Long term (current) use of anticoagulants: Secondary | ICD-10-CM | POA: Diagnosis not present

## 2023-06-27 DIAGNOSIS — Z953 Presence of xenogenic heart valve: Secondary | ICD-10-CM | POA: Diagnosis not present

## 2023-07-05 DIAGNOSIS — Z48812 Encounter for surgical aftercare following surgery on the circulatory system: Secondary | ICD-10-CM | POA: Diagnosis not present

## 2023-07-05 DIAGNOSIS — E669 Obesity, unspecified: Secondary | ICD-10-CM | POA: Diagnosis not present

## 2023-07-05 DIAGNOSIS — Z953 Presence of xenogenic heart valve: Secondary | ICD-10-CM | POA: Diagnosis not present

## 2023-07-05 DIAGNOSIS — E785 Hyperlipidemia, unspecified: Secondary | ICD-10-CM | POA: Diagnosis not present

## 2023-07-05 DIAGNOSIS — E039 Hypothyroidism, unspecified: Secondary | ICD-10-CM | POA: Diagnosis not present

## 2023-07-05 DIAGNOSIS — Z7901 Long term (current) use of anticoagulants: Secondary | ICD-10-CM | POA: Diagnosis not present

## 2023-07-05 DIAGNOSIS — Z951 Presence of aortocoronary bypass graft: Secondary | ICD-10-CM | POA: Diagnosis not present

## 2023-07-05 DIAGNOSIS — Z87891 Personal history of nicotine dependence: Secondary | ICD-10-CM | POA: Diagnosis not present

## 2023-07-05 DIAGNOSIS — I10 Essential (primary) hypertension: Secondary | ICD-10-CM | POA: Diagnosis not present

## 2023-07-05 DIAGNOSIS — Z6830 Body mass index (BMI) 30.0-30.9, adult: Secondary | ICD-10-CM | POA: Diagnosis not present

## 2023-07-06 DIAGNOSIS — R7303 Prediabetes: Secondary | ICD-10-CM | POA: Diagnosis not present

## 2023-07-06 DIAGNOSIS — E291 Testicular hypofunction: Secondary | ICD-10-CM | POA: Diagnosis not present

## 2023-07-06 DIAGNOSIS — N401 Enlarged prostate with lower urinary tract symptoms: Secondary | ICD-10-CM | POA: Diagnosis not present

## 2023-07-06 DIAGNOSIS — D751 Secondary polycythemia: Secondary | ICD-10-CM | POA: Diagnosis not present

## 2023-07-06 DIAGNOSIS — Z125 Encounter for screening for malignant neoplasm of prostate: Secondary | ICD-10-CM | POA: Diagnosis not present

## 2023-07-06 DIAGNOSIS — Z952 Presence of prosthetic heart valve: Secondary | ICD-10-CM | POA: Diagnosis not present

## 2023-07-07 DIAGNOSIS — E291 Testicular hypofunction: Secondary | ICD-10-CM | POA: Diagnosis not present

## 2023-07-07 DIAGNOSIS — D751 Secondary polycythemia: Secondary | ICD-10-CM | POA: Diagnosis not present

## 2023-07-07 DIAGNOSIS — N401 Enlarged prostate with lower urinary tract symptoms: Secondary | ICD-10-CM | POA: Diagnosis not present

## 2023-07-07 DIAGNOSIS — Z125 Encounter for screening for malignant neoplasm of prostate: Secondary | ICD-10-CM | POA: Diagnosis not present

## 2023-07-07 DIAGNOSIS — R7303 Prediabetes: Secondary | ICD-10-CM | POA: Diagnosis not present

## 2023-07-07 DIAGNOSIS — Z952 Presence of prosthetic heart valve: Secondary | ICD-10-CM | POA: Diagnosis not present

## 2023-07-12 DIAGNOSIS — Z952 Presence of prosthetic heart valve: Secondary | ICD-10-CM | POA: Diagnosis not present

## 2023-07-12 DIAGNOSIS — Z953 Presence of xenogenic heart valve: Secondary | ICD-10-CM | POA: Diagnosis not present

## 2023-07-12 DIAGNOSIS — Z7901 Long term (current) use of anticoagulants: Secondary | ICD-10-CM | POA: Diagnosis not present

## 2023-07-19 DIAGNOSIS — Z952 Presence of prosthetic heart valve: Secondary | ICD-10-CM | POA: Diagnosis not present

## 2023-07-19 DIAGNOSIS — Z7901 Long term (current) use of anticoagulants: Secondary | ICD-10-CM | POA: Diagnosis not present

## 2023-07-25 DIAGNOSIS — Z953 Presence of xenogenic heart valve: Secondary | ICD-10-CM | POA: Diagnosis not present

## 2023-07-25 DIAGNOSIS — Z7901 Long term (current) use of anticoagulants: Secondary | ICD-10-CM | POA: Diagnosis not present

## 2023-07-25 DIAGNOSIS — Z952 Presence of prosthetic heart valve: Secondary | ICD-10-CM | POA: Diagnosis not present

## 2023-08-01 DIAGNOSIS — Z7901 Long term (current) use of anticoagulants: Secondary | ICD-10-CM | POA: Diagnosis not present

## 2023-08-01 DIAGNOSIS — Z952 Presence of prosthetic heart valve: Secondary | ICD-10-CM | POA: Diagnosis not present

## 2023-08-01 DIAGNOSIS — N401 Enlarged prostate with lower urinary tract symptoms: Secondary | ICD-10-CM | POA: Diagnosis not present

## 2023-08-08 DIAGNOSIS — E039 Hypothyroidism, unspecified: Secondary | ICD-10-CM | POA: Diagnosis not present

## 2023-08-08 DIAGNOSIS — N401 Enlarged prostate with lower urinary tract symptoms: Secondary | ICD-10-CM | POA: Diagnosis not present

## 2023-08-08 DIAGNOSIS — I959 Hypotension, unspecified: Secondary | ICD-10-CM | POA: Diagnosis not present

## 2023-08-08 DIAGNOSIS — Z7901 Long term (current) use of anticoagulants: Secondary | ICD-10-CM | POA: Diagnosis not present

## 2023-08-08 DIAGNOSIS — Z952 Presence of prosthetic heart valve: Secondary | ICD-10-CM | POA: Diagnosis not present

## 2023-08-17 DIAGNOSIS — D751 Secondary polycythemia: Secondary | ICD-10-CM | POA: Diagnosis not present

## 2023-08-17 DIAGNOSIS — T82867A Thrombosis of cardiac prosthetic devices, implants and grafts, initial encounter: Secondary | ICD-10-CM | POA: Diagnosis not present

## 2023-08-17 DIAGNOSIS — E291 Testicular hypofunction: Secondary | ICD-10-CM | POA: Diagnosis not present

## 2023-08-17 DIAGNOSIS — Z7901 Long term (current) use of anticoagulants: Secondary | ICD-10-CM | POA: Diagnosis not present

## 2023-08-17 DIAGNOSIS — E039 Hypothyroidism, unspecified: Secondary | ICD-10-CM | POA: Diagnosis not present

## 2023-08-17 DIAGNOSIS — Z952 Presence of prosthetic heart valve: Secondary | ICD-10-CM | POA: Diagnosis not present

## 2023-08-29 DIAGNOSIS — Z7901 Long term (current) use of anticoagulants: Secondary | ICD-10-CM | POA: Diagnosis not present

## 2023-08-29 DIAGNOSIS — I350 Nonrheumatic aortic (valve) stenosis: Secondary | ICD-10-CM | POA: Diagnosis not present

## 2023-08-29 DIAGNOSIS — Z952 Presence of prosthetic heart valve: Secondary | ICD-10-CM | POA: Diagnosis not present

## 2023-09-06 DIAGNOSIS — Z7901 Long term (current) use of anticoagulants: Secondary | ICD-10-CM | POA: Diagnosis not present

## 2023-09-06 DIAGNOSIS — R4182 Altered mental status, unspecified: Secondary | ICD-10-CM | POA: Diagnosis not present

## 2023-09-06 DIAGNOSIS — R079 Chest pain, unspecified: Secondary | ICD-10-CM | POA: Diagnosis not present

## 2023-09-06 DIAGNOSIS — Z953 Presence of xenogenic heart valve: Secondary | ICD-10-CM | POA: Diagnosis not present

## 2023-09-06 DIAGNOSIS — G454 Transient global amnesia: Secondary | ICD-10-CM | POA: Diagnosis not present

## 2023-09-06 DIAGNOSIS — R413 Other amnesia: Secondary | ICD-10-CM | POA: Diagnosis not present

## 2023-09-06 DIAGNOSIS — Z952 Presence of prosthetic heart valve: Secondary | ICD-10-CM | POA: Diagnosis not present

## 2023-09-06 DIAGNOSIS — R0602 Shortness of breath: Secondary | ICD-10-CM | POA: Diagnosis not present

## 2023-09-06 DIAGNOSIS — J90 Pleural effusion, not elsewhere classified: Secondary | ICD-10-CM | POA: Diagnosis not present

## 2023-09-14 DIAGNOSIS — Z95828 Presence of other vascular implants and grafts: Secondary | ICD-10-CM | POA: Diagnosis not present

## 2023-09-14 DIAGNOSIS — Z952 Presence of prosthetic heart valve: Secondary | ICD-10-CM | POA: Diagnosis not present

## 2023-09-14 DIAGNOSIS — Z7901 Long term (current) use of anticoagulants: Secondary | ICD-10-CM | POA: Diagnosis not present

## 2023-09-14 DIAGNOSIS — Z953 Presence of xenogenic heart valve: Secondary | ICD-10-CM | POA: Diagnosis not present

## 2023-09-14 DIAGNOSIS — J90 Pleural effusion, not elsewhere classified: Secondary | ICD-10-CM | POA: Diagnosis not present

## 2023-09-14 DIAGNOSIS — G454 Transient global amnesia: Secondary | ICD-10-CM | POA: Diagnosis not present

## 2023-09-20 DIAGNOSIS — Z7901 Long term (current) use of anticoagulants: Secondary | ICD-10-CM | POA: Diagnosis not present

## 2023-09-20 DIAGNOSIS — Z952 Presence of prosthetic heart valve: Secondary | ICD-10-CM | POA: Diagnosis not present

## 2023-09-20 DIAGNOSIS — Z953 Presence of xenogenic heart valve: Secondary | ICD-10-CM | POA: Diagnosis not present

## 2023-09-26 DIAGNOSIS — L570 Actinic keratosis: Secondary | ICD-10-CM | POA: Diagnosis not present

## 2023-09-26 DIAGNOSIS — Z85828 Personal history of other malignant neoplasm of skin: Secondary | ICD-10-CM | POA: Diagnosis not present

## 2023-09-26 DIAGNOSIS — D1801 Hemangioma of skin and subcutaneous tissue: Secondary | ICD-10-CM | POA: Diagnosis not present

## 2023-09-26 DIAGNOSIS — L821 Other seborrheic keratosis: Secondary | ICD-10-CM | POA: Diagnosis not present

## 2023-09-27 DIAGNOSIS — Z951 Presence of aortocoronary bypass graft: Secondary | ICD-10-CM | POA: Diagnosis not present

## 2023-09-27 DIAGNOSIS — E782 Mixed hyperlipidemia: Secondary | ICD-10-CM | POA: Diagnosis not present

## 2023-09-27 DIAGNOSIS — I2510 Atherosclerotic heart disease of native coronary artery without angina pectoris: Secondary | ICD-10-CM | POA: Diagnosis not present

## 2023-09-27 DIAGNOSIS — Z7901 Long term (current) use of anticoagulants: Secondary | ICD-10-CM | POA: Diagnosis not present

## 2023-09-27 DIAGNOSIS — Z953 Presence of xenogenic heart valve: Secondary | ICD-10-CM | POA: Diagnosis not present

## 2023-09-27 DIAGNOSIS — G454 Transient global amnesia: Secondary | ICD-10-CM | POA: Diagnosis not present

## 2023-09-27 DIAGNOSIS — T82867D Thrombosis of cardiac prosthetic devices, implants and grafts, subsequent encounter: Secondary | ICD-10-CM | POA: Diagnosis not present

## 2023-09-27 DIAGNOSIS — Z8679 Personal history of other diseases of the circulatory system: Secondary | ICD-10-CM | POA: Diagnosis not present

## 2023-09-29 DIAGNOSIS — N400 Enlarged prostate without lower urinary tract symptoms: Secondary | ICD-10-CM | POA: Diagnosis not present

## 2023-09-29 DIAGNOSIS — R351 Nocturia: Secondary | ICD-10-CM | POA: Diagnosis not present

## 2023-10-03 DIAGNOSIS — Z953 Presence of xenogenic heart valve: Secondary | ICD-10-CM | POA: Diagnosis not present

## 2023-10-04 DIAGNOSIS — Z953 Presence of xenogenic heart valve: Secondary | ICD-10-CM | POA: Diagnosis not present

## 2023-10-04 DIAGNOSIS — Z952 Presence of prosthetic heart valve: Secondary | ICD-10-CM | POA: Diagnosis not present

## 2023-10-04 DIAGNOSIS — Z7901 Long term (current) use of anticoagulants: Secondary | ICD-10-CM | POA: Diagnosis not present

## 2023-10-11 DIAGNOSIS — Z952 Presence of prosthetic heart valve: Secondary | ICD-10-CM | POA: Diagnosis not present

## 2023-10-11 DIAGNOSIS — Z7901 Long term (current) use of anticoagulants: Secondary | ICD-10-CM | POA: Diagnosis not present

## 2023-10-12 ENCOUNTER — Ambulatory Visit: Payer: Medicare HMO

## 2023-10-12 ENCOUNTER — Other Ambulatory Visit (HOSPITAL_COMMUNITY): Payer: Medicare HMO

## 2023-10-13 DIAGNOSIS — Z953 Presence of xenogenic heart valve: Secondary | ICD-10-CM | POA: Diagnosis not present

## 2023-10-13 DIAGNOSIS — Z7901 Long term (current) use of anticoagulants: Secondary | ICD-10-CM | POA: Diagnosis not present

## 2023-10-15 DIAGNOSIS — Z952 Presence of prosthetic heart valve: Secondary | ICD-10-CM | POA: Diagnosis not present

## 2023-10-17 DIAGNOSIS — Z7901 Long term (current) use of anticoagulants: Secondary | ICD-10-CM | POA: Diagnosis not present

## 2023-10-17 DIAGNOSIS — Z953 Presence of xenogenic heart valve: Secondary | ICD-10-CM | POA: Diagnosis not present

## 2023-10-25 DIAGNOSIS — Z953 Presence of xenogenic heart valve: Secondary | ICD-10-CM | POA: Diagnosis not present

## 2023-10-25 DIAGNOSIS — Z7901 Long term (current) use of anticoagulants: Secondary | ICD-10-CM | POA: Diagnosis not present

## 2023-10-26 ENCOUNTER — Encounter: Payer: Self-pay | Admitting: Cardiology

## 2023-10-27 DIAGNOSIS — E039 Hypothyroidism, unspecified: Secondary | ICD-10-CM | POA: Diagnosis not present

## 2023-10-27 DIAGNOSIS — B001 Herpesviral vesicular dermatitis: Secondary | ICD-10-CM | POA: Diagnosis not present

## 2023-10-27 DIAGNOSIS — E1159 Type 2 diabetes mellitus with other circulatory complications: Secondary | ICD-10-CM | POA: Diagnosis not present

## 2023-10-27 DIAGNOSIS — I10 Essential (primary) hypertension: Secondary | ICD-10-CM | POA: Diagnosis not present

## 2023-10-27 DIAGNOSIS — I2510 Atherosclerotic heart disease of native coronary artery without angina pectoris: Secondary | ICD-10-CM | POA: Diagnosis not present

## 2023-10-27 DIAGNOSIS — D751 Secondary polycythemia: Secondary | ICD-10-CM | POA: Diagnosis not present

## 2023-10-27 DIAGNOSIS — Z79899 Other long term (current) drug therapy: Secondary | ICD-10-CM | POA: Diagnosis not present

## 2023-10-27 DIAGNOSIS — E785 Hyperlipidemia, unspecified: Secondary | ICD-10-CM | POA: Diagnosis not present

## 2023-10-27 DIAGNOSIS — K5790 Diverticulosis of intestine, part unspecified, without perforation or abscess without bleeding: Secondary | ICD-10-CM | POA: Diagnosis not present

## 2023-10-27 DIAGNOSIS — R7301 Impaired fasting glucose: Secondary | ICD-10-CM | POA: Diagnosis not present

## 2023-10-31 ENCOUNTER — Ambulatory Visit: Payer: Medicare HMO | Admitting: Cardiology

## 2023-11-08 DIAGNOSIS — Z7901 Long term (current) use of anticoagulants: Secondary | ICD-10-CM | POA: Diagnosis not present

## 2023-11-08 DIAGNOSIS — Z953 Presence of xenogenic heart valve: Secondary | ICD-10-CM | POA: Diagnosis not present

## 2023-11-15 DIAGNOSIS — Z952 Presence of prosthetic heart valve: Secondary | ICD-10-CM | POA: Diagnosis not present

## 2023-11-15 DIAGNOSIS — Z953 Presence of xenogenic heart valve: Secondary | ICD-10-CM | POA: Diagnosis not present

## 2023-11-15 DIAGNOSIS — Z7901 Long term (current) use of anticoagulants: Secondary | ICD-10-CM | POA: Diagnosis not present

## 2023-11-16 DIAGNOSIS — N529 Male erectile dysfunction, unspecified: Secondary | ICD-10-CM | POA: Diagnosis not present

## 2023-11-16 DIAGNOSIS — Z952 Presence of prosthetic heart valve: Secondary | ICD-10-CM | POA: Diagnosis not present

## 2023-11-16 DIAGNOSIS — N401 Enlarged prostate with lower urinary tract symptoms: Secondary | ICD-10-CM | POA: Diagnosis not present

## 2023-11-16 DIAGNOSIS — Z7901 Long term (current) use of anticoagulants: Secondary | ICD-10-CM | POA: Diagnosis not present

## 2023-11-16 DIAGNOSIS — R7303 Prediabetes: Secondary | ICD-10-CM | POA: Diagnosis not present

## 2023-11-16 DIAGNOSIS — D751 Secondary polycythemia: Secondary | ICD-10-CM | POA: Diagnosis not present

## 2023-11-16 DIAGNOSIS — I25810 Atherosclerosis of coronary artery bypass graft(s) without angina pectoris: Secondary | ICD-10-CM | POA: Diagnosis not present

## 2023-11-16 DIAGNOSIS — E039 Hypothyroidism, unspecified: Secondary | ICD-10-CM | POA: Diagnosis not present

## 2023-11-22 DIAGNOSIS — R351 Nocturia: Secondary | ICD-10-CM | POA: Diagnosis not present

## 2023-11-22 DIAGNOSIS — R350 Frequency of micturition: Secondary | ICD-10-CM | POA: Diagnosis not present

## 2023-11-22 DIAGNOSIS — Z953 Presence of xenogenic heart valve: Secondary | ICD-10-CM | POA: Diagnosis not present

## 2023-11-22 DIAGNOSIS — Z7901 Long term (current) use of anticoagulants: Secondary | ICD-10-CM | POA: Diagnosis not present

## 2023-11-22 DIAGNOSIS — N400 Enlarged prostate without lower urinary tract symptoms: Secondary | ICD-10-CM | POA: Diagnosis not present

## 2023-12-06 DIAGNOSIS — Z7901 Long term (current) use of anticoagulants: Secondary | ICD-10-CM | POA: Diagnosis not present

## 2023-12-06 DIAGNOSIS — Z953 Presence of xenogenic heart valve: Secondary | ICD-10-CM | POA: Diagnosis not present

## 2023-12-20 DIAGNOSIS — Z7901 Long term (current) use of anticoagulants: Secondary | ICD-10-CM | POA: Diagnosis not present

## 2023-12-20 DIAGNOSIS — Z953 Presence of xenogenic heart valve: Secondary | ICD-10-CM | POA: Diagnosis not present

## 2023-12-28 DIAGNOSIS — M19031 Primary osteoarthritis, right wrist: Secondary | ICD-10-CM | POA: Diagnosis not present

## 2023-12-28 DIAGNOSIS — M25531 Pain in right wrist: Secondary | ICD-10-CM | POA: Diagnosis not present

## 2024-01-03 DIAGNOSIS — Z7901 Long term (current) use of anticoagulants: Secondary | ICD-10-CM | POA: Diagnosis not present

## 2024-01-03 DIAGNOSIS — Z952 Presence of prosthetic heart valve: Secondary | ICD-10-CM | POA: Diagnosis not present

## 2024-01-03 DIAGNOSIS — Z953 Presence of xenogenic heart valve: Secondary | ICD-10-CM | POA: Diagnosis not present

## 2024-01-10 DIAGNOSIS — Z7901 Long term (current) use of anticoagulants: Secondary | ICD-10-CM | POA: Diagnosis not present

## 2024-01-10 DIAGNOSIS — Z953 Presence of xenogenic heart valve: Secondary | ICD-10-CM | POA: Diagnosis not present

## 2024-01-11 DIAGNOSIS — G5611 Other lesions of median nerve, right upper limb: Secondary | ICD-10-CM | POA: Diagnosis not present

## 2024-01-11 DIAGNOSIS — G5621 Lesion of ulnar nerve, right upper limb: Secondary | ICD-10-CM | POA: Diagnosis not present

## 2024-01-17 DIAGNOSIS — Z953 Presence of xenogenic heart valve: Secondary | ICD-10-CM | POA: Diagnosis not present

## 2024-01-17 DIAGNOSIS — Z7901 Long term (current) use of anticoagulants: Secondary | ICD-10-CM | POA: Diagnosis not present

## 2024-01-25 DIAGNOSIS — Z7901 Long term (current) use of anticoagulants: Secondary | ICD-10-CM | POA: Diagnosis not present

## 2024-01-25 DIAGNOSIS — Z953 Presence of xenogenic heart valve: Secondary | ICD-10-CM | POA: Diagnosis not present

## 2024-01-30 DIAGNOSIS — G5621 Lesion of ulnar nerve, right upper limb: Secondary | ICD-10-CM | POA: Diagnosis not present

## 2024-02-01 DIAGNOSIS — Z7901 Long term (current) use of anticoagulants: Secondary | ICD-10-CM | POA: Diagnosis not present

## 2024-02-01 DIAGNOSIS — Z953 Presence of xenogenic heart valve: Secondary | ICD-10-CM | POA: Diagnosis not present

## 2024-02-01 DIAGNOSIS — Z952 Presence of prosthetic heart valve: Secondary | ICD-10-CM | POA: Diagnosis not present

## 2024-02-08 DIAGNOSIS — Z953 Presence of xenogenic heart valve: Secondary | ICD-10-CM | POA: Diagnosis not present

## 2024-02-08 DIAGNOSIS — Z7901 Long term (current) use of anticoagulants: Secondary | ICD-10-CM | POA: Diagnosis not present

## 2024-02-15 DIAGNOSIS — Z0000 Encounter for general adult medical examination without abnormal findings: Secondary | ICD-10-CM | POA: Diagnosis not present

## 2024-02-15 DIAGNOSIS — E291 Testicular hypofunction: Secondary | ICD-10-CM | POA: Diagnosis not present

## 2024-02-15 DIAGNOSIS — I25810 Atherosclerosis of coronary artery bypass graft(s) without angina pectoris: Secondary | ICD-10-CM | POA: Diagnosis not present

## 2024-02-15 DIAGNOSIS — Z953 Presence of xenogenic heart valve: Secondary | ICD-10-CM | POA: Diagnosis not present

## 2024-02-15 DIAGNOSIS — N401 Enlarged prostate with lower urinary tract symptoms: Secondary | ICD-10-CM | POA: Diagnosis not present

## 2024-02-15 DIAGNOSIS — Z952 Presence of prosthetic heart valve: Secondary | ICD-10-CM | POA: Diagnosis not present

## 2024-02-15 DIAGNOSIS — Z7901 Long term (current) use of anticoagulants: Secondary | ICD-10-CM | POA: Diagnosis not present

## 2024-02-15 DIAGNOSIS — R7303 Prediabetes: Secondary | ICD-10-CM | POA: Diagnosis not present

## 2024-02-22 DIAGNOSIS — R350 Frequency of micturition: Secondary | ICD-10-CM | POA: Diagnosis not present

## 2024-02-22 DIAGNOSIS — Z953 Presence of xenogenic heart valve: Secondary | ICD-10-CM | POA: Diagnosis not present

## 2024-02-22 DIAGNOSIS — Z7901 Long term (current) use of anticoagulants: Secondary | ICD-10-CM | POA: Diagnosis not present

## 2024-02-22 DIAGNOSIS — N400 Enlarged prostate without lower urinary tract symptoms: Secondary | ICD-10-CM | POA: Diagnosis not present

## 2024-02-22 DIAGNOSIS — R351 Nocturia: Secondary | ICD-10-CM | POA: Diagnosis not present

## 2024-02-29 DIAGNOSIS — Z953 Presence of xenogenic heart valve: Secondary | ICD-10-CM | POA: Diagnosis not present

## 2024-02-29 DIAGNOSIS — Z952 Presence of prosthetic heart valve: Secondary | ICD-10-CM | POA: Diagnosis not present

## 2024-02-29 DIAGNOSIS — Z7901 Long term (current) use of anticoagulants: Secondary | ICD-10-CM | POA: Diagnosis not present

## 2024-03-07 DIAGNOSIS — Z7901 Long term (current) use of anticoagulants: Secondary | ICD-10-CM | POA: Diagnosis not present

## 2024-03-07 DIAGNOSIS — Z953 Presence of xenogenic heart valve: Secondary | ICD-10-CM | POA: Diagnosis not present

## 2024-03-14 DIAGNOSIS — Z7901 Long term (current) use of anticoagulants: Secondary | ICD-10-CM | POA: Diagnosis not present

## 2024-03-14 DIAGNOSIS — Z953 Presence of xenogenic heart valve: Secondary | ICD-10-CM | POA: Diagnosis not present

## 2024-03-21 DIAGNOSIS — Z953 Presence of xenogenic heart valve: Secondary | ICD-10-CM | POA: Diagnosis not present

## 2024-03-21 DIAGNOSIS — Z7901 Long term (current) use of anticoagulants: Secondary | ICD-10-CM | POA: Diagnosis not present

## 2024-03-22 DIAGNOSIS — Z951 Presence of aortocoronary bypass graft: Secondary | ICD-10-CM | POA: Diagnosis not present

## 2024-03-22 DIAGNOSIS — Z953 Presence of xenogenic heart valve: Secondary | ICD-10-CM | POA: Diagnosis not present

## 2024-03-22 DIAGNOSIS — E782 Mixed hyperlipidemia: Secondary | ICD-10-CM | POA: Diagnosis not present

## 2024-03-22 DIAGNOSIS — I2510 Atherosclerotic heart disease of native coronary artery without angina pectoris: Secondary | ICD-10-CM | POA: Diagnosis not present

## 2024-03-28 DIAGNOSIS — Z952 Presence of prosthetic heart valve: Secondary | ICD-10-CM | POA: Diagnosis not present

## 2024-04-04 DIAGNOSIS — Z7901 Long term (current) use of anticoagulants: Secondary | ICD-10-CM | POA: Diagnosis not present

## 2024-04-04 DIAGNOSIS — Z953 Presence of xenogenic heart valve: Secondary | ICD-10-CM | POA: Diagnosis not present

## 2024-04-11 DIAGNOSIS — Z7901 Long term (current) use of anticoagulants: Secondary | ICD-10-CM | POA: Diagnosis not present

## 2024-04-11 DIAGNOSIS — Z953 Presence of xenogenic heart valve: Secondary | ICD-10-CM | POA: Diagnosis not present

## 2024-04-18 DIAGNOSIS — Z953 Presence of xenogenic heart valve: Secondary | ICD-10-CM | POA: Diagnosis not present

## 2024-04-18 DIAGNOSIS — Z7901 Long term (current) use of anticoagulants: Secondary | ICD-10-CM | POA: Diagnosis not present

## 2024-04-25 DIAGNOSIS — Z7901 Long term (current) use of anticoagulants: Secondary | ICD-10-CM | POA: Diagnosis not present

## 2024-04-25 DIAGNOSIS — Z952 Presence of prosthetic heart valve: Secondary | ICD-10-CM | POA: Diagnosis not present

## 2024-04-25 DIAGNOSIS — Z953 Presence of xenogenic heart valve: Secondary | ICD-10-CM | POA: Diagnosis not present

## 2024-05-02 DIAGNOSIS — L570 Actinic keratosis: Secondary | ICD-10-CM | POA: Diagnosis not present

## 2024-05-02 DIAGNOSIS — Z7189 Other specified counseling: Secondary | ICD-10-CM | POA: Diagnosis not present

## 2024-05-02 DIAGNOSIS — L438 Other lichen planus: Secondary | ICD-10-CM | POA: Diagnosis not present

## 2024-05-02 DIAGNOSIS — Z7901 Long term (current) use of anticoagulants: Secondary | ICD-10-CM | POA: Diagnosis not present

## 2024-05-02 DIAGNOSIS — Z85828 Personal history of other malignant neoplasm of skin: Secondary | ICD-10-CM | POA: Diagnosis not present

## 2024-05-02 DIAGNOSIS — L738 Other specified follicular disorders: Secondary | ICD-10-CM | POA: Diagnosis not present

## 2024-05-02 DIAGNOSIS — L821 Other seborrheic keratosis: Secondary | ICD-10-CM | POA: Diagnosis not present

## 2024-05-02 DIAGNOSIS — Z953 Presence of xenogenic heart valve: Secondary | ICD-10-CM | POA: Diagnosis not present

## 2024-05-02 DIAGNOSIS — Z08 Encounter for follow-up examination after completed treatment for malignant neoplasm: Secondary | ICD-10-CM | POA: Diagnosis not present

## 2024-05-02 DIAGNOSIS — L578 Other skin changes due to chronic exposure to nonionizing radiation: Secondary | ICD-10-CM | POA: Diagnosis not present

## 2024-05-09 DIAGNOSIS — Z7901 Long term (current) use of anticoagulants: Secondary | ICD-10-CM | POA: Diagnosis not present

## 2024-05-09 DIAGNOSIS — Z953 Presence of xenogenic heart valve: Secondary | ICD-10-CM | POA: Diagnosis not present

## 2024-05-17 DIAGNOSIS — Z953 Presence of xenogenic heart valve: Secondary | ICD-10-CM | POA: Diagnosis not present

## 2024-05-17 DIAGNOSIS — Z7901 Long term (current) use of anticoagulants: Secondary | ICD-10-CM | POA: Diagnosis not present

## 2024-05-21 DIAGNOSIS — H5203 Hypermetropia, bilateral: Secondary | ICD-10-CM | POA: Diagnosis not present

## 2024-05-21 DIAGNOSIS — H52221 Regular astigmatism, right eye: Secondary | ICD-10-CM | POA: Diagnosis not present

## 2024-05-21 DIAGNOSIS — H2513 Age-related nuclear cataract, bilateral: Secondary | ICD-10-CM | POA: Diagnosis not present

## 2024-05-21 DIAGNOSIS — H524 Presbyopia: Secondary | ICD-10-CM | POA: Diagnosis not present

## 2024-05-21 DIAGNOSIS — H353131 Nonexudative age-related macular degeneration, bilateral, early dry stage: Secondary | ICD-10-CM | POA: Diagnosis not present

## 2024-05-21 DIAGNOSIS — H40013 Open angle with borderline findings, low risk, bilateral: Secondary | ICD-10-CM | POA: Diagnosis not present

## 2024-05-23 DIAGNOSIS — Z952 Presence of prosthetic heart valve: Secondary | ICD-10-CM | POA: Diagnosis not present

## 2024-05-23 DIAGNOSIS — N401 Enlarged prostate with lower urinary tract symptoms: Secondary | ICD-10-CM | POA: Diagnosis not present

## 2024-05-23 DIAGNOSIS — Z7901 Long term (current) use of anticoagulants: Secondary | ICD-10-CM | POA: Diagnosis not present

## 2024-05-23 DIAGNOSIS — Z953 Presence of xenogenic heart valve: Secondary | ICD-10-CM | POA: Diagnosis not present

## 2024-05-23 DIAGNOSIS — N5201 Erectile dysfunction due to arterial insufficiency: Secondary | ICD-10-CM | POA: Diagnosis not present

## 2024-05-23 DIAGNOSIS — R3912 Poor urinary stream: Secondary | ICD-10-CM | POA: Diagnosis not present

## 2024-05-23 DIAGNOSIS — R350 Frequency of micturition: Secondary | ICD-10-CM | POA: Diagnosis not present

## 2024-05-30 DIAGNOSIS — Z7901 Long term (current) use of anticoagulants: Secondary | ICD-10-CM | POA: Diagnosis not present

## 2024-05-30 DIAGNOSIS — Z953 Presence of xenogenic heart valve: Secondary | ICD-10-CM | POA: Diagnosis not present

## 2024-06-06 DIAGNOSIS — R3915 Urgency of urination: Secondary | ICD-10-CM | POA: Diagnosis not present

## 2024-06-06 DIAGNOSIS — Z7901 Long term (current) use of anticoagulants: Secondary | ICD-10-CM | POA: Diagnosis not present

## 2024-06-06 DIAGNOSIS — Z953 Presence of xenogenic heart valve: Secondary | ICD-10-CM | POA: Diagnosis not present

## 2024-06-13 DIAGNOSIS — Z953 Presence of xenogenic heart valve: Secondary | ICD-10-CM | POA: Diagnosis not present

## 2024-06-13 DIAGNOSIS — Z7901 Long term (current) use of anticoagulants: Secondary | ICD-10-CM | POA: Diagnosis not present

## 2024-06-20 DIAGNOSIS — Z952 Presence of prosthetic heart valve: Secondary | ICD-10-CM | POA: Diagnosis not present

## 2024-06-20 DIAGNOSIS — D230 Other benign neoplasm of skin of lip: Secondary | ICD-10-CM | POA: Diagnosis not present

## 2024-06-20 DIAGNOSIS — Z953 Presence of xenogenic heart valve: Secondary | ICD-10-CM | POA: Diagnosis not present

## 2024-06-20 DIAGNOSIS — Z7901 Long term (current) use of anticoagulants: Secondary | ICD-10-CM | POA: Diagnosis not present

## 2024-06-22 DIAGNOSIS — D230 Other benign neoplasm of skin of lip: Secondary | ICD-10-CM | POA: Diagnosis not present

## 2024-06-22 DIAGNOSIS — R220 Localized swelling, mass and lump, head: Secondary | ICD-10-CM | POA: Diagnosis not present

## 2024-06-27 DIAGNOSIS — Z953 Presence of xenogenic heart valve: Secondary | ICD-10-CM | POA: Diagnosis not present

## 2024-06-27 DIAGNOSIS — Z7901 Long term (current) use of anticoagulants: Secondary | ICD-10-CM | POA: Diagnosis not present

## 2024-07-11 DIAGNOSIS — Z953 Presence of xenogenic heart valve: Secondary | ICD-10-CM | POA: Diagnosis not present

## 2024-07-11 DIAGNOSIS — Z7901 Long term (current) use of anticoagulants: Secondary | ICD-10-CM | POA: Diagnosis not present

## 2024-07-18 DIAGNOSIS — Z952 Presence of prosthetic heart valve: Secondary | ICD-10-CM | POA: Diagnosis not present

## 2024-07-19 DIAGNOSIS — Z7901 Long term (current) use of anticoagulants: Secondary | ICD-10-CM | POA: Diagnosis not present

## 2024-07-19 DIAGNOSIS — Z953 Presence of xenogenic heart valve: Secondary | ICD-10-CM | POA: Diagnosis not present

## 2024-07-24 DIAGNOSIS — Z953 Presence of xenogenic heart valve: Secondary | ICD-10-CM | POA: Diagnosis not present

## 2024-07-24 DIAGNOSIS — Z7901 Long term (current) use of anticoagulants: Secondary | ICD-10-CM | POA: Diagnosis not present
